# Patient Record
Sex: Male | Born: 1935 | Race: White | Hispanic: No | Marital: Married | Smoking: Former smoker
Health system: Southern US, Community
[De-identification: ages and names within clinical notes are randomized; demographics above are authoritative.]

## PROBLEM LIST (undated history)

## (undated) DIAGNOSIS — J302 Other seasonal allergic rhinitis: Secondary | ICD-10-CM

## (undated) DIAGNOSIS — M1990 Unspecified osteoarthritis, unspecified site: Secondary | ICD-10-CM

## (undated) DIAGNOSIS — K5790 Diverticulosis of intestine, part unspecified, without perforation or abscess without bleeding: Secondary | ICD-10-CM

## (undated) DIAGNOSIS — N400 Enlarged prostate without lower urinary tract symptoms: Secondary | ICD-10-CM

## (undated) DIAGNOSIS — I639 Cerebral infarction, unspecified: Secondary | ICD-10-CM

## (undated) DIAGNOSIS — C801 Malignant (primary) neoplasm, unspecified: Secondary | ICD-10-CM

## (undated) DIAGNOSIS — I82409 Acute embolism and thrombosis of unspecified deep veins of unspecified lower extremity: Secondary | ICD-10-CM

## (undated) DIAGNOSIS — Z9289 Personal history of other medical treatment: Secondary | ICD-10-CM

## (undated) DIAGNOSIS — I2699 Other pulmonary embolism without acute cor pulmonale: Secondary | ICD-10-CM

## (undated) HISTORY — PX: TONSILLECTOMY: SUR1361

## (undated) HISTORY — PX: SHOULDER SURGERY: SHX246

---

## 1997-10-16 ENCOUNTER — Emergency Department (HOSPITAL_COMMUNITY): Admission: EM | Admit: 1997-10-16 | Discharge: 1997-10-16 | Payer: Self-pay | Admitting: Emergency Medicine

## 1999-06-27 ENCOUNTER — Encounter: Payer: Self-pay | Admitting: Orthopedic Surgery

## 1999-06-27 ENCOUNTER — Encounter: Admission: RE | Admit: 1999-06-27 | Discharge: 1999-06-27 | Payer: Self-pay | Admitting: Orthopedic Surgery

## 2005-04-20 DIAGNOSIS — I2699 Other pulmonary embolism without acute cor pulmonale: Secondary | ICD-10-CM

## 2005-04-20 DIAGNOSIS — I82409 Acute embolism and thrombosis of unspecified deep veins of unspecified lower extremity: Secondary | ICD-10-CM

## 2005-04-20 HISTORY — DX: Other pulmonary embolism without acute cor pulmonale: I26.99

## 2005-04-20 HISTORY — DX: Acute embolism and thrombosis of unspecified deep veins of unspecified lower extremity: I82.409

## 2005-10-20 ENCOUNTER — Inpatient Hospital Stay (HOSPITAL_COMMUNITY): Admission: AC | Admit: 2005-10-20 | Discharge: 2005-10-23 | Payer: Self-pay

## 2005-12-03 ENCOUNTER — Encounter: Admission: RE | Admit: 2005-12-03 | Discharge: 2005-12-03 | Payer: Self-pay | Admitting: Family Medicine

## 2005-12-04 ENCOUNTER — Encounter: Admission: RE | Admit: 2005-12-04 | Discharge: 2005-12-04 | Payer: Self-pay | Admitting: Family Medicine

## 2005-12-05 ENCOUNTER — Inpatient Hospital Stay (HOSPITAL_COMMUNITY): Admission: EM | Admit: 2005-12-05 | Discharge: 2005-12-09 | Payer: Self-pay | Admitting: Emergency Medicine

## 2006-01-06 ENCOUNTER — Ambulatory Visit (HOSPITAL_COMMUNITY): Admission: RE | Admit: 2006-01-06 | Discharge: 2006-01-06 | Payer: Self-pay | Admitting: Neurosurgery

## 2006-11-24 ENCOUNTER — Encounter: Admission: RE | Admit: 2006-11-24 | Discharge: 2006-11-24 | Payer: Self-pay | Admitting: Family Medicine

## 2008-04-20 HISTORY — PX: OTHER SURGICAL HISTORY: SHX169
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
## 2012-04-27 ENCOUNTER — Other Ambulatory Visit: Payer: Self-pay | Admitting: Physician Assistant

## 2012-04-27 ENCOUNTER — Ambulatory Visit
Admission: RE | Admit: 2012-04-27 | Discharge: 2012-04-27 | Disposition: A | Discharge status: Home / Self Care | Payer: Medicare Other | Source: Ambulatory Visit | Attending: Physician Assistant | Admitting: Physician Assistant

## 2012-04-27 DIAGNOSIS — R52 Pain, unspecified: Secondary | ICD-10-CM

## 2012-04-27 DIAGNOSIS — L539 Erythematous condition, unspecified: Secondary | ICD-10-CM

## 2012-04-27 DIAGNOSIS — R609 Edema, unspecified: Secondary | ICD-10-CM

## 2014-06-07 ENCOUNTER — Other Ambulatory Visit (INDEPENDENT_AMBULATORY_CARE_PROVIDER_SITE_OTHER): Payer: Self-pay | Admitting: Surgery

## 2014-06-22 ENCOUNTER — Encounter (HOSPITAL_COMMUNITY): Payer: Self-pay

## 2014-06-22 ENCOUNTER — Encounter (HOSPITAL_COMMUNITY)
Admission: RE | Admit: 2014-06-22 | Discharge: 2014-06-22 | Disposition: A | Discharge status: Home / Self Care | Payer: Medicare Other | Source: Ambulatory Visit | Attending: Surgery | Admitting: Surgery

## 2014-06-22 DIAGNOSIS — Z8673 Personal history of transient ischemic attack (TIA), and cerebral infarction without residual deficits: Secondary | ICD-10-CM | POA: Diagnosis not present

## 2014-06-22 DIAGNOSIS — Z9089 Acquired absence of other organs: Secondary | ICD-10-CM | POA: Diagnosis not present

## 2014-06-22 DIAGNOSIS — K4090 Unilateral inguinal hernia, without obstruction or gangrene, not specified as recurrent: Secondary | ICD-10-CM | POA: Diagnosis present

## 2014-06-22 DIAGNOSIS — K4020 Bilateral inguinal hernia, without obstruction or gangrene, not specified as recurrent: Secondary | ICD-10-CM | POA: Diagnosis not present

## 2014-06-22 DIAGNOSIS — Z87891 Personal history of nicotine dependence: Secondary | ICD-10-CM | POA: Diagnosis not present

## 2014-06-22 DIAGNOSIS — Z79899 Other long term (current) drug therapy: Secondary | ICD-10-CM | POA: Diagnosis not present

## 2014-06-22 DIAGNOSIS — Z7951 Long term (current) use of inhaled steroids: Secondary | ICD-10-CM | POA: Diagnosis not present

## 2014-06-22 DIAGNOSIS — K429 Umbilical hernia without obstruction or gangrene: Secondary | ICD-10-CM | POA: Diagnosis present

## 2014-06-22 DIAGNOSIS — Z86711 Personal history of pulmonary embolism: Secondary | ICD-10-CM | POA: Diagnosis not present

## 2014-06-22 HISTORY — DX: Cerebral infarction, unspecified: I63.9

## 2014-06-22 HISTORY — DX: Personal history of other medical treatment: Z92.89

## 2014-06-22 HISTORY — DX: Acute embolism and thrombosis of unspecified deep veins of unspecified lower extremity: I82.409

## 2014-06-22 HISTORY — DX: Unspecified osteoarthritis, unspecified site: M19.90

## 2014-06-22 HISTORY — DX: Other seasonal allergic rhinitis: J30.2

## 2014-06-22 HISTORY — DX: Malignant (primary) neoplasm, unspecified: C80.1

## 2014-06-22 HISTORY — DX: Benign prostatic hyperplasia without lower urinary tract symptoms: N40.0

## 2014-06-22 LAB — BASIC METABOLIC PANEL
Anion gap: 5 (ref 5–15)
BUN: 13 mg/dL (ref 6–23)
CALCIUM: 9.1 mg/dL (ref 8.4–10.5)
CO2: 27 mmol/L (ref 19–32)
Chloride: 107 mmol/L (ref 96–112)
Creatinine, Ser: 1.04 mg/dL (ref 0.50–1.35)
GFR calc Af Amer: 77 mL/min — ABNORMAL LOW (ref >90)
GFR, EST NON AFRICAN AMERICAN: 67 mL/min — AB (ref >90)
GLUCOSE: 134 mg/dL — AB (ref 70–99)
Potassium: 3.9 mmol/L (ref 3.5–5.1)
Sodium: 139 mmol/L (ref 135–145)

## 2014-06-22 LAB — CBC
HEMATOCRIT: 44.2 % (ref 39.0–52.0)
HEMOGLOBIN: 14.8 g/dL (ref 13.0–17.0)
MCH: 31.8 pg (ref 26.0–34.0)
MCHC: 33.5 g/dL (ref 30.0–36.0)
MCV: 95.1 fL (ref 78.0–100.0)
Platelets: 154 10*3/uL (ref 150–400)
RBC: 4.65 MIL/uL (ref 4.22–5.81)
RDW: 13.5 % (ref 11.5–15.5)
WBC: 6.4 10*3/uL (ref 4.0–10.5)

## 2014-06-22 NOTE — Pre-Procedure Instructions (Signed)
Robert Everett  06/22/2014   Your procedure is scheduled on:  3/ 01/2015  Report to [facility] Admitting   ENTRANCE  A   at 6:45 AM.  Call this number if you have problems the morning of surgery: [phone]   Remember:   Do not eat food or drink liquids after midnight.  On  Wednesday  Take these medicines the morning of surgery with A SIP OF WATER: NOTHING   Do not wear jewelry   Do not wear lotions, powders, or perfumes. You may wear deodorant.    Men may shave face and neck.   Do not bring valuables to the hospital.  [facility] is not responsible                  for any belongings or valuables.               Contacts, dentures or bridgework may not be worn into surgery.   Leave suitcase in the car. After surgery it may be brought to your room.   For patients admitted to the hospital, discharge time is determined by your                treatment team.               Patients discharged the day of surgery will not be allowed to drive  home.  Name and phone number of your driver: with family  Special Instructions: Special Instructions: [place] - Preparing for Surgery  Before surgery, you can play an important role.  Because skin is not sterile, your skin needs to be as free of germs as possible.  You can reduce the number of germs on you skin by washing with CHG (chlorahexidine gluconate) soap before surgery.  CHG is an antiseptic cleaner which kills germs and bonds with the skin to continue killing germs even after washing.  Please DO NOT use if you have an allergy to CHG or antibacterial soaps.  If your skin becomes reddened/irritated stop using the CHG and inform your nurse when you arrive at Short Stay.  Do not shave (including legs and underarms) for at least 48 hours prior to the first CHG shower.  You may shave your face.  Please follow these instructions carefully:   1.  Shower with CHG Soap the night before surgery and the  morning of Surgery.  2.  If  you choose to wash your hair, wash your hair first as usual with your  normal shampoo.  3.  After you shampoo, rinse your hair and body thoroughly to remove the  Shampoo.  4.  Use CHG as you would any other liquid soap.  You can apply chg directly to the skin and wash gently with scrungie or a clean washcloth.  5.  Apply the CHG Soap to your body ONLY FROM THE NECK DOWN.    Do not use on open wounds or open sores.  Avoid contact with your eyes, ears, mouth and genitals (private parts).  Wash genitals (private parts)   with your normal soap.  6.  Wash thoroughly, paying special attention to the area where your surgery will be performed.  7.  Thoroughly rinse your body with warm water from the neck down.  8.  DO NOT shower/wash with your normal soap after using and rinsing off   the CHG Soap.  9.  Pat yourself dry with a clean towel.  10.  Wear clean pajamas.            11.  Place clean sheets on your bed the night of your first shower and do not sleep with pets.  Day of Surgery  Do not apply any lotions/deodorants the morning of surgery.  Please wear clean clothes to the hospital/surgery center.   Please read over the following fact sheets that you were given: Pain Booklet, Coughing and Deep Breathing and Surgical Site Infection Prevention

## 2014-06-22 NOTE — Progress Notes (Signed)
Pt. Aware of need to hold aspirin after Sat. 06/23/2014

## 2014-06-27 MED ORDER — CEFAZOLIN SODIUM-DEXTROSE 2-3 GM-% IV SOLR
2.0000 g | INTRAVENOUS | Status: AC
  Administered 2014-06-28: 2 g via INTRAVENOUS
  Filled 2014-06-27: qty 50

## 2014-06-27 NOTE — H&P (Signed)
Robert Everett 06/07/2014 1:34 PM Location: [facility] Patient #: [id number] DOB: 1934/04/16 Married / Language: Cleophus Molt / Race: White Male  History of Present Illness Nathaneil Canary A. Ninfa Linden MD; 06/07/2014 1:53 PM) Patient words: RIH.  The patient is a 81 year old male who presents with an umbilical hernia. This is a very pleasant gentleman referred to me by Dr. Donnie Coffin for evaluation of a right inguinal hernia. The patient noticed that after lifting a wheelchair taking care of his daughter. He has noticed a easily reducible bulge in the groin. He has pain but no nausea or vomiting or obstructive symptoms. He is otherwise healthy without complaints.   Other Problems Marjean Donna, CMA; 06/07/2014 1:35 PM) Cerebrovascular Accident Diverticulosis Melanoma Pulmonary Embolism / Blood Clot in Legs  Past Surgical History Marjean Donna, CMA; 06/07/2014 1:35 PM) Colon Polyp Removal - Colonoscopy Shoulder Surgery Right. Tonsillectomy  Diagnostic Studies History Marjean Donna, CMA; 06/07/2014 1:35 PM) Colonoscopy 5-10 years ago  Allergies Davy Pique Bynum, CMA; 06/07/2014 1:35 PM) No Known Drug Allergies02/18/2016  Medication History (Sonya Bynum, CMA; 06/07/2014 1:35 PM) Fluticasone Propionate (50MCG/ACT Suspension, Nasal) Active. Atorvastatin Calcium (40MG  Tablet, Oral) Active.  Social History Marjean Donna, CMA; 06/07/2014 1:35 PM) Caffeine use Carbonated beverages, Tea. No alcohol use No drug use Tobacco use Former smoker.  Family History Marjean Donna, Kake; 06/07/2014 1:35 PM) Alcohol Abuse Son. Cancer Father. Heart disease in male family member before age 69  Review of Systems (Plain; 06/07/2014 1:35 PM) General Not Present- Appetite Loss, Chills, Fatigue, Fever, Night Sweats, Weight Gain and Weight Loss. Skin Not Present- Change in Wart/Mole, Dryness, Hives, Jaundice, New Lesions, Non-Healing Wounds, Rash and Ulcer. HEENT Present- Hearing  Loss, Ringing in the Ears, Seasonal Allergies and Wears glasses/contact lenses. Not Present- Earache, Hoarseness, Nose Bleed, Oral Ulcers, Sinus Pain, Sore Throat, Visual Disturbances and Yellow Eyes. Breast Not Present- Breast Mass, Breast Pain, Nipple Discharge and Skin Changes. Cardiovascular Not Present- Chest Pain, Difficulty Breathing Lying Down, Leg Cramps, Palpitations, Rapid Heart Rate, Shortness of Breath and Swelling of Extremities. Gastrointestinal Not Present- Abdominal Pain, Bloating, Bloody Stool, Change in Bowel Habits, Chronic diarrhea, Constipation, Difficulty Swallowing, Excessive gas, Gets full quickly at meals, Hemorrhoids, Indigestion, Nausea, Rectal Pain and Vomiting. Male Genitourinary Not Present- Blood in Urine, Change in Urinary Stream, Frequency, Impotence, Nocturia, Painful Urination, Urgency and Urine Leakage. Musculoskeletal Present- Joint Stiffness. Not Present- Back Pain, Joint Pain, Muscle Pain, Muscle Weakness and Swelling of Extremities. Neurological Not Present- Decreased Memory, Fainting, Headaches, Numbness, Seizures, Tingling, Tremor, Trouble walking and Weakness. Psychiatric Not Present- Anxiety, Bipolar, Change in Sleep Pattern, Depression, Fearful and Frequent crying. Endocrine Not Present- Cold Intolerance, Excessive Hunger, Hair Changes, Heat Intolerance, Hot flashes and New Diabetes. Hematology Not Present- Easy Bruising, Excessive bleeding, Gland problems, HIV and Persistent Infections.   Vitals (Sonya Bynum CMA; 06/07/2014 1:36 PM) 06/07/2014 1:35 PM Weight: 184 lb Height: 67in Body Surface Area: 1.99 m Body Mass Index: 28.82 kg/m Temp.: 98.51F(Temporal)  Pulse: 73 (Regular)  BP: 130/74 (Sitting, Left Arm, Standard)    Physical Exam (Jennefer Kopp A. Ninfa Linden MD; 06/07/2014 1:54 PM) General Mental Status-Alert. General Appearance-Consistent with stated age. Hydration-Well hydrated. Voice-Normal.  Head and  Neck Head-normocephalic, atraumatic with no lesions or palpable masses. Trachea-midline.  Eye Eyeball - Bilateral-Extraocular movements intact. Sclera/Conjunctiva - Bilateral-No scleral icterus.  Chest and Lung Exam Chest and lung exam reveals -quiet, even and easy respiratory effort with no use of accessory muscles and on auscultation, normal breath sounds, no adventitious  sounds and normal vocal resonance. Inspection Chest Wall - Normal. Back - normal.  Cardiovascular Cardiovascular examination reveals -normal heart sounds, regular rate and rhythm with no murmurs and normal pedal pulses bilaterally.  Abdomen Inspection Skin - Scar - no surgical scars. Hernias - Umbilical hernia - Reducible. Inguinal hernia - Right - Reducible. Palpation/Percussion Palpation and Percussion of the abdomen reveal - Soft, Non Tender, No Rebound tenderness, No Rigidity (guarding) and No hepatosplenomegaly. Auscultation Auscultation of the abdomen reveals - Bowel sounds normal.  Neurologic Neurologic evaluation reveals -alert and oriented x 3 with no impairment of recent or remote memory. Mental Status-Normal.  Musculoskeletal Normal Exam - Left-Upper Extremity Strength Normal and Lower Extremity Strength Normal. Normal Exam - Right-Upper Extremity Strength Normal, Lower Extremity Weakness.    Assessment & Plan (Patsy Zaragoza A. Ninfa Linden MD; 06/07/2014 1:55 PM) RIGHT INGUINAL HERNIA (550.90  K40.90) Impression: He does have a moderate-sized right inguinal hernia. The left inguinal area does feel weakened. He also has a very small umbilical hernia. I discussed repair of the hernias with mesh. I discussed both the open and laboratory techniques. I recommended left scopic right inguinal hernia repair with mesh. At that time I can fix the umbilical hernia as well as evaluate the left inguinal floor. I would fix that hernia with mesh if there was one present. I discussed the risks of surgery  which includes but is not limited to bleeding, infection, chronic pain, recurrence, conversion to an open procedure, etc. I Also discussed postoperative recovery. He understands and wishes to proceed     Signed by Harl Bowie, MD (06/07/2014 1:56 PM)

## 2014-06-28 ENCOUNTER — Ambulatory Visit (HOSPITAL_COMMUNITY): Payer: Medicare Other | Admitting: Anesthesiology

## 2014-06-28 ENCOUNTER — Encounter (HOSPITAL_COMMUNITY): Payer: Self-pay | Admitting: *Deleted

## 2014-06-28 ENCOUNTER — Encounter (HOSPITAL_COMMUNITY)
Admission: RE | Disposition: A | Discharge status: Home / Self Care | Payer: Self-pay | Source: Ambulatory Visit | Attending: Surgery

## 2014-06-28 ENCOUNTER — Ambulatory Visit (HOSPITAL_COMMUNITY)
Admission: RE | Admit: 2014-06-28 | Discharge: 2014-06-28 | Disposition: A | Discharge status: Home / Self Care | Payer: Medicare Other | Source: Ambulatory Visit | Attending: Surgery | Admitting: Surgery

## 2014-06-28 DIAGNOSIS — K4020 Bilateral inguinal hernia, without obstruction or gangrene, not specified as recurrent: Secondary | ICD-10-CM | POA: Insufficient documentation

## 2014-06-28 DIAGNOSIS — Z9089 Acquired absence of other organs: Secondary | ICD-10-CM | POA: Diagnosis not present

## 2014-06-28 DIAGNOSIS — Z7951 Long term (current) use of inhaled steroids: Secondary | ICD-10-CM | POA: Insufficient documentation

## 2014-06-28 DIAGNOSIS — K429 Umbilical hernia without obstruction or gangrene: Secondary | ICD-10-CM | POA: Insufficient documentation

## 2014-06-28 DIAGNOSIS — Z8673 Personal history of transient ischemic attack (TIA), and cerebral infarction without residual deficits: Secondary | ICD-10-CM | POA: Insufficient documentation

## 2014-06-28 DIAGNOSIS — Z86711 Personal history of pulmonary embolism: Secondary | ICD-10-CM | POA: Insufficient documentation

## 2014-06-28 DIAGNOSIS — Z87891 Personal history of nicotine dependence: Secondary | ICD-10-CM | POA: Insufficient documentation

## 2014-06-28 DIAGNOSIS — Z79899 Other long term (current) drug therapy: Secondary | ICD-10-CM | POA: Insufficient documentation

## 2014-06-28 HISTORY — PX: LAPAROSCOPIC INGUINAL HERNIA WITH UMBILICAL HERNIA: SHX5658

## 2014-06-28 HISTORY — PX: INSERTION OF MESH: SHX5868

## 2014-06-28 SURGERY — LAPAROSCOPIC INGUINAL HERNIA WITH UMBILICAL HERNIA
Anesthesia: General | Laterality: Bilateral

## 2014-06-28 MED ORDER — KETOROLAC TROMETHAMINE 30 MG/ML IJ SOLN
30.0000 mg | Freq: Once | INTRAMUSCULAR | Status: AC | PRN
  Administered 2014-06-28: 30 mg via INTRAVENOUS

## 2014-06-28 MED ORDER — LIDOCAINE HCL (CARDIAC) 20 MG/ML IV SOLN
INTRAVENOUS | Status: AC
  Filled 2014-06-28: qty 5

## 2014-06-28 MED ORDER — ROCURONIUM BROMIDE 100 MG/10ML IV SOLN
INTRAVENOUS | Status: DC | PRN
  Administered 2014-06-28: 40 mg via INTRAVENOUS
  Administered 2014-06-28: 10 mg via INTRAVENOUS

## 2014-06-28 MED ORDER — MIDAZOLAM HCL 5 MG/5ML IJ SOLN
INTRAMUSCULAR | Status: DC | PRN
Start: 2014-06-28 — End: 2014-06-28
  Administered 2014-06-28: 1 mg via INTRAVENOUS

## 2014-06-28 MED ORDER — KETOROLAC TROMETHAMINE 30 MG/ML IJ SOLN
INTRAMUSCULAR | Status: AC
  Filled 2014-06-28: qty 1

## 2014-06-28 MED ORDER — ONDANSETRON HCL 4 MG/2ML IJ SOLN
INTRAMUSCULAR | Status: AC
  Filled 2014-06-28: qty 2

## 2014-06-28 MED ORDER — FENTANYL CITRATE 0.05 MG/ML IJ SOLN
25.0000 ug | INTRAMUSCULAR | Status: DC | PRN
  Administered 2014-06-28 (×4): 25 ug via INTRAVENOUS

## 2014-06-28 MED ORDER — 0.9 % SODIUM CHLORIDE (POUR BTL) OPTIME
TOPICAL | Status: DC | PRN
  Administered 2014-06-28: 1000 mL

## 2014-06-28 MED ORDER — GLYCOPYRROLATE 0.2 MG/ML IJ SOLN
INTRAMUSCULAR | Status: DC | PRN
  Administered 2014-06-28: 0.6 mg via INTRAVENOUS

## 2014-06-28 MED ORDER — ROCURONIUM BROMIDE 50 MG/5ML IV SOLN
INTRAVENOUS | Status: AC
  Filled 2014-06-28: qty 1

## 2014-06-28 MED ORDER — MIDAZOLAM HCL 2 MG/2ML IJ SOLN
INTRAMUSCULAR | Status: AC
  Filled 2014-06-28: qty 2

## 2014-06-28 MED ORDER — BUPIVACAINE-EPINEPHRINE (PF) 0.25% -1:200000 IJ SOLN
INTRAMUSCULAR | Status: AC
  Filled 2014-06-28: qty 30

## 2014-06-28 MED ORDER — GLYCOPYRROLATE 0.2 MG/ML IJ SOLN
INTRAMUSCULAR | Status: AC
  Filled 2014-06-28: qty 3

## 2014-06-28 MED ORDER — LIDOCAINE HCL (CARDIAC) 20 MG/ML IV SOLN
INTRAVENOUS | Status: DC | PRN
  Administered 2014-06-28: 80 mg via INTRAVENOUS

## 2014-06-28 MED ORDER — NEOSTIGMINE METHYLSULFATE 10 MG/10ML IV SOLN
INTRAVENOUS | Status: DC | PRN
  Administered 2014-06-28: 4 mg via INTRAVENOUS

## 2014-06-28 MED ORDER — PROPOFOL 10 MG/ML IV BOLUS
INTRAVENOUS | Status: AC
  Filled 2014-06-28: qty 20

## 2014-06-28 MED ORDER — FENTANYL CITRATE 0.05 MG/ML IJ SOLN
INTRAMUSCULAR | Status: AC
  Filled 2014-06-28: qty 2

## 2014-06-28 MED ORDER — ONDANSETRON HCL 4 MG/2ML IJ SOLN: 4.0000 mg | Freq: Once | INTRAMUSCULAR | Status: DC | PRN

## 2014-06-28 MED ORDER — ONDANSETRON HCL 4 MG/2ML IJ SOLN
INTRAMUSCULAR | Status: DC | PRN
  Administered 2014-06-28: 4 mg via INTRAVENOUS

## 2014-06-28 MED ORDER — HYDROCODONE-ACETAMINOPHEN 5-325 MG PO TABS: 1.0000 | ORAL_TABLET | ORAL | Status: DC | PRN

## 2014-06-28 MED ORDER — FENTANYL CITRATE 0.05 MG/ML IJ SOLN
INTRAMUSCULAR | Status: DC | PRN
  Administered 2014-06-28: 50 ug via INTRAVENOUS
  Administered 2014-06-28: 100 ug via INTRAVENOUS

## 2014-06-28 MED ORDER — PROPOFOL 10 MG/ML IV BOLUS
INTRAVENOUS | Status: DC | PRN
  Administered 2014-06-28: 160 mg via INTRAVENOUS

## 2014-06-28 MED ORDER — BUPIVACAINE HCL (PF) 0.25 % IJ SOLN
INTRAMUSCULAR | Status: DC | PRN
  Administered 2014-06-28: 20 mL

## 2014-06-28 MED ORDER — FENTANYL CITRATE 0.05 MG/ML IJ SOLN
INTRAMUSCULAR | Status: AC
  Filled 2014-06-28: qty 5

## 2014-06-28 MED ORDER — LACTATED RINGERS IV SOLN
INTRAVENOUS | Status: DC
  Administered 2014-06-28 (×2): via INTRAVENOUS

## 2014-06-28 SURGICAL SUPPLY — 35 items
APPLIER CLIP LOGIC TI 5 (MISCELLANEOUS) IMPLANT
APR CLP MED LRG 33X5 (MISCELLANEOUS)
BLADE SURG ROTATE 9660 (MISCELLANEOUS) IMPLANT
CANISTER SUCTION 2500CC (MISCELLANEOUS) IMPLANT
CHLORAPREP W/TINT 26ML (MISCELLANEOUS) ×3 IMPLANT
COVER SURGICAL LIGHT HANDLE (MISCELLANEOUS) ×3 IMPLANT
DEVICE SECURE STRAP 25 ABSORB (INSTRUMENTS) ×3 IMPLANT
DISSECT BALLN SPACEMKR + OVL (BALLOONS) ×3
DISSECTOR BALLN SPACEMKR + OVL (BALLOONS) ×1 IMPLANT
DISSECTOR BLUNT TIP ENDO 5MM (MISCELLANEOUS) IMPLANT
DRAPE LAPAROSCOPIC ABDOMINAL (DRAPES) ×3 IMPLANT
ELECT REM PT RETURN 9FT ADLT (ELECTROSURGICAL) ×3
ELECTRODE REM PT RTRN 9FT ADLT (ELECTROSURGICAL) ×1 IMPLANT
GLOVE SURG SIGNA 7.5 PF LTX (GLOVE) ×3 IMPLANT
GOWN STRL REUS W/ TWL LRG LVL3 (GOWN DISPOSABLE) ×2 IMPLANT
GOWN STRL REUS W/ TWL XL LVL3 (GOWN DISPOSABLE) ×1 IMPLANT
GOWN STRL REUS W/TWL LRG LVL3 (GOWN DISPOSABLE) ×6
GOWN STRL REUS W/TWL XL LVL3 (GOWN DISPOSABLE) ×3
KIT BASIN OR (CUSTOM PROCEDURE TRAY) ×3 IMPLANT
KIT ROOM TURNOVER OR (KITS) ×3 IMPLANT
LIQUID BAND (GAUZE/BANDAGES/DRESSINGS) ×3 IMPLANT
MESH 3DMAX 4X6 LT LRG (Mesh General) ×3 IMPLANT
MESH 3DMAX 4X6 RT LRG (Mesh General) ×3 IMPLANT
NDL INSUFFLATION 14GA 120MM (NEEDLE) IMPLANT
NEEDLE INSUFFLATION 14GA 120MM (NEEDLE) IMPLANT
NS IRRIG 1000ML POUR BTL (IV SOLUTION) ×3 IMPLANT
PAD ARMBOARD 7.5X6 YLW CONV (MISCELLANEOUS) ×3 IMPLANT
SET IRRIG TUBING LAPAROSCOPIC (IRRIGATION / IRRIGATOR) IMPLANT
SET TROCAR LAP APPLE-HUNT 5MM (ENDOMECHANICALS) ×3 IMPLANT
SUT MNCRL AB 4-0 PS2 18 (SUTURE) ×3 IMPLANT
TOWEL OR 17X24 6PK STRL BLUE (TOWEL DISPOSABLE) ×3 IMPLANT
TOWEL OR 17X26 10 PK STRL BLUE (TOWEL DISPOSABLE) ×3 IMPLANT
TRAY FOLEY CATH 16FR SILVER (SET/KITS/TRAYS/PACK) ×3 IMPLANT
TRAY LAPAROSCOPIC (CUSTOM PROCEDURE TRAY) ×3 IMPLANT
TUBING INSUFFLATION (TUBING) ×3 IMPLANT

## 2014-06-28 NOTE — Anesthesia Procedure Notes (Signed)
Procedure Name: Intubation Date/Time: 06/28/2014 8:56 AM Performed by: Kyung Rudd Pre-anesthesia Checklist: Patient identified, Emergency Drugs available, Suction available, Patient being monitored and Timeout performed Patient Re-evaluated:Patient Re-evaluated prior to inductionOxygen Delivery Method: Circle system utilized Preoxygenation: Pre-oxygenation with 100% oxygen Intubation Type: IV induction Ventilation: Mask ventilation without difficulty and Oral airway inserted - appropriate to patient size Laryngoscope Size: Mac and 3 Grade View: Grade I Tube type: Oral Tube size: 7.5 mm Number of attempts: 1 Airway Equipment and Method: Stylet Placement Confirmation: ETT inserted through vocal cords under direct vision,  positive ETCO2 and breath sounds checked- equal and bilateral Secured at: 21 cm Tube secured with: Tape Dental Injury: Teeth and Oropharynx as per pre-operative assessment

## 2014-06-28 NOTE — Op Note (Signed)
NAME:  Robert Everett, NEIS NO.:  [id number]  MEDICAL RECORD NO.:  [id number]  LOCATION:  [facility]                         FACILITY:  [facility]  PHYSICIAN:  Coralie Keens, M.D. DATE OF BIRTH:  1933/12/15  DATE OF PROCEDURE:  06/28/2014 DATE OF DISCHARGE:  06/28/2014                              OPERATIVE REPORT   POSTOPERATIVE DIAGNOSES:  Right inguinal hernia and umbilical hernia.  POSTOPERATIVE DIAGNOSES:  Bilateral inguinal hernias and umbilical hernia.  PROCEDURES: 1. Bilateral laparoscopic inguinal hernia repair with mesh. 2. Umbilical hernia repair.  SURGEON:  Coralie Keens, M.D.  ANESTHESIA:  General and 0.5% Marcaine.  ESTIMATED BLOOD LOSS:  Minimal.  FINDINGS:  The patient was found to have bilateral direct and indirect inguinal hernias with the right being larger than the left.  There was also a very tiny umbilical hernia.  I did not repair the umbilical hernia with mesh, I just repaired it primarily.  The inguinal hernias were repaired with Bard 3DMax Prolene mesh.  A large piece was used for each side.  DESCRIPTION OF PROCEDURE:  The patient was brought to the operating room, identified as Daine Gip.  He was placed supine on the operating room table.  General anesthesia was induced.  A Foley catheter was then inserted.  His abdomen was then prepped and draped in usual sterile fashion.  I made a small vertical incision at the lower part of the umbilicus.  I carried this down to the fascia which was opened just to the right of midline.  The rectus muscle was then identified and elevated.  I placed a dissecting balloon underneath the rectus muscle and manipulated towards the pelvis.  The dissecting balloon was then insufflated under direct space, dissecting out the preperitoneal space. The dissecting balloon was then removed and the insufflation was begun. Two 5 mm ports were then placed in the patient's lower midline, both under direct vision.   I dissected out the right inguinal area first. The patient had a moderate-sized direct hernia as well as a moderate indirect hernia.  I was able to easily reduce the indirect hernial sac from the cord structures.  I then turned my attention to the left inguinal area.  Again, the patient had a much smaller direct as well as a much smaller indirect inguinal hernia on this side.  I was able again to easily reduce the sac on the left side from the cord structures.  At this point, a left-sided large 3DMax Bard Prolene mesh was brought onto the field.  I placed it through the umbilical port and opened it as an onlay on the left inguinal floor.  I then used the Securestrap absorbable tacker to tack it to [place] ligament up the medial abdominal wall slightly laterally.  Good coverage of the fascial defects and testicular cord appeared to be achieved.  I then brought a right-sided piece of Bard 3DMax Prolene mesh onto the field.  I placed it through the umbilical port and opened it as an onlay on the right inguinal floor.  Again, I tacked it to [place] ligament up the medial abdominal wall slightly laterally.  Again, good coverage of the fascial defects and cord structures  appeared to be achieved.  At this point, hemostasis also appeared to be achieved.  I removed the midline ports and the preperitoneal space was seen to collapse appropriately with the mesh in place.  I then removed all ports.  I closed the small fascial defect at the umbilicus with figure-of-eight 0 Vicryl suture as well as the fascial defect from the incision.  I then anesthetized all the wounds with Marcaine.  I performed bilateral ilioinguinal nerve blocks with Marcaine.  I then closed all incisions with 4-0 Monocryl sutures.  Skin glue was then applied.  The patient tolerated the procedure well.  All the counts were correct at the end of the procedure.  The patient was then extubated in the operating room and taken in stable  condition to recovery room.     Coralie Keens, M.D.     DB/MEDQ  D:  06/28/2014  T:  06/28/2014  Job:  [id number]

## 2014-06-28 NOTE — Transfer of Care (Signed)
Immediate Anesthesia Transfer of Care Note  Patient: Robert Everett  Procedure(s) Performed: Procedure(s): LAPAROSCOPIC RIGHT AND LEFT  INGUINAL HERNIA WITH UMBILICAL HERNIA  AND MESH (Bilateral) INSERTION OF MESH (Bilateral)  Patient Location: PACU  Anesthesia Type:General  Level of Consciousness: awake, alert  and oriented  Airway & Oxygen Therapy: Patient Spontanous Breathing and Patient connected to nasal cannula oxygen  Post-op Assessment: Report given to RN and Post -op Vital signs reviewed and stable  Post vital signs: Reviewed and stable  Last Vitals:  Filed Vitals:   06/28/14 0639  BP: 165/59  Pulse: 51  Temp: 36.4 C  Resp: 18    Complications: No apparent anesthesia complications

## 2014-06-28 NOTE — Discharge Instructions (Signed)
[facility] _______[facility]  UMBILICAL OR INGUINAL HERNIA REPAIR: POST OP INSTRUCTIONS  Always review your discharge instruction sheet given to you by the facility where your surgery was performed. IF YOU HAVE DISABILITY OR FAMILY LEAVE FORMS, YOU MUST BRING THEM TO THE OFFICE FOR PROCESSING.   DO NOT GIVE THEM TO YOUR DOCTOR.  1. A  prescription for pain medication may be given to you upon discharge.  Take your pain medication as prescribed, if needed.  If narcotic pain medicine is not needed, then you may take acetaminophen (Tylenol) or ibuprofen (Advil) as needed. 2. Take your usually prescribed medications unless otherwise directed. 3. If you need a refill on your pain medication, please contact your pharmacy.  They will contact our office to request authorization. Prescriptions will not be filled after 5 pm or on week-ends. 4. You should follow a light diet the first 24 hours after arrival home, such as soup and crackers, etc.  Be sure to include lots of fluids daily.  Resume your normal diet the day after surgery. 5. Most patients will experience some swelling and bruising around the umbilicus or in the groin and scrotum.  Ice packs and reclining will help.  Swelling and bruising can take several days to resolve.  6. It is common to experience some constipation if taking pain medication after surgery.  Increasing fluid intake and taking a stool softener (such as Colace) will usually help or prevent this problem from occurring.  A mild laxative (Milk of Magnesia or Miralax) should be taken according to package directions if there are no bowel movements after 48 hours. 7. Unless discharge instructions indicate otherwise, you may remove your bandages 24-48 hours after surgery, and you may shower at that time.  You may have steri-strips (small skin tapes) in place directly over the incision.  These strips should be left on the skin for 7-10 days.  If your surgeon used skin glue on the  incision, you may shower in 24 hours.  The glue will flake off over the next 2-3 weeks.  Any sutures or staples will be removed at the office during your follow-up visit. 8. ACTIVITIES:  You may resume regular (light) daily activities beginning the next day--such as daily self-care, walking, climbing stairs--gradually increasing activities as tolerated.  You may have sexual intercourse when it is comfortable.  Refrain from any heavy lifting or straining until approved by your doctor. a. You may drive when you are no longer taking prescription pain medication, you can comfortably wear a seatbelt, and you can safely maneuver your car and apply brakes. b. RETURN TO WORK:  __________________________________________________________ 9. You should see your doctor in the office for a follow-up appointment approximately 2-3 weeks after your surgery.  Make sure that you call for this appointment within a day or two after you arrive home to insure a convenient appointment time. 10. OTHER INSTRUCTIONS: NO LIFTING MORE THAN 15 TO 20 POUNDS FOR 3 WEEKS 11. ICE PACK ALSO FOR PAIN __________________________________________________________________________________________________________________________________________________________________________________________  WHEN TO CALL YOUR DOCTOR: 1. Fever over 101.0 2. Inability to urinate 3. Nausea and/or vomiting 4. Extreme swelling or bruising 5. Continued bleeding from incision. 6. Increased pain, redness, or drainage from the incision  The clinic staff is available to answer your questions during regular business hours.  Please dont hesitate to call and ask to speak to one of the nurses for clinical concerns.  If you have a medical emergency, go to the nearest emergency room or call 911.  A surgeon from [facility] is always on call at the hospital   [address], [place], [place], [place]  [address] ?  [address], [place], [place] [phone] ? [phone] ? FAX [phone] Web site: [email or web address]  General Anesthesia, Adult, Care After  Refer to this sheet in the next few weeks. These instructions provide you with information on caring for yourself after your procedure. Your health care provider may also give you more specific instructions. Your treatment has been planned according to current medical practices, but problems sometimes occur. Call your health care provider if you have any problems or questions after your procedure.  WHAT TO EXPECT AFTER THE PROCEDURE  After the procedure, it is typical to experience:  Sleepiness.  Nausea and vomiting. HOME CARE INSTRUCTIONS  For the first 24 hours after general anesthesia:  Have a responsible person with you.  Do not drive a car. If you are alone, do not take public transportation.  Do not drink alcohol.  Do not take medicine that has not been prescribed by your health care provider.  Do not sign important papers or make important decisions.  You may resume a normal diet and activities as directed by your health care provider.  Change bandages (dressings) as directed.  If you have questions or problems that seem related to general anesthesia, call the hospital and ask for the anesthetist or anesthesiologist on call. SEEK MEDICAL CARE IF:  You have nausea and vomiting that continue the day after anesthesia.  You develop a rash. SEEK IMMEDIATE MEDICAL CARE IF:  You have difficulty breathing.  You have chest pain.  You have any allergic problems. Document Released: 07/13/2000 Document Revised: 12/07/2012 Document Reviewed: 10/20/2012  [facility] Patient Information 2014 [place], [place].

## 2014-06-28 NOTE — Interval H&P Note (Signed)
History and Physical Interval Note: no change in H and P  06/28/2014 6:44 AM  Robert Everett  has presented today for surgery, with the diagnosis of Right Inguinal Hernia and Umbilical Hernia  The various methods of treatment have been discussed with the patient and family. After consideration of risks, benefits and other options for treatment, the patient has consented to  Procedure(s): LAPAROSCOPIC RIGHT AND POSSBILE LEFT  INGUINAL HERNIA WITH UMBILICAL HERNIA AND MESH (Right) INSERTION OF MESH (Right) as a surgical intervention .  The patient's history has been reviewed, patient examined, no change in status, stable for surgery.  I have reviewed the patient's chart and labs.  Questions were answered to the patient's satisfaction.     Anastacio Bua A

## 2014-06-28 NOTE — Op Note (Signed)
LAPAROSCOPIC RIGHT AND LEFT  INGUINAL HERNIA WITH UMBILICAL HERNIA  AND MESH, INSERTION OF MESH  Procedure Note  Robert Everett 06/28/2014   Pre-op Diagnosis: Right Inguinal Hernia and Umbilical Hernia     Post-op Diagnosis: bilateral inguinal hernia and umbilical hernia  Procedure(s): LAPAROSCOPIC BILATERAL INGUINAL HERNIA REPAIR WITH MESH UMBILICAL HERNIA REPAIR   Surgeon(s): Coralie Keens, MD  Anesthesia: General  Staff:  Circulator: Montel Culver, RN Scrub Person: Beverlee Nims, RN; Liberty Handy, CST Circulator Assistant: Beverlee Nims, RN  Estimated Blood Loss: Minimal                         [name]   Date: 06/28/2014  Time: 9:41 AM

## 2014-06-28 NOTE — Anesthesia Preprocedure Evaluation (Signed)
Anesthesia Evaluation  Patient identified by MRN, date of birth, ID band Patient awake    Reviewed: Allergy & Precautions, NPO status , Patient's Chart, lab work & pertinent test results  Airway Mallampati: II  TM Distance: >3 FB Neck ROM: Full    Dental  (+) Edentulous Upper, Edentulous Lower   Pulmonary former smoker,  breath sounds clear to auscultation        Cardiovascular Rhythm:Regular Rate:Normal     Neuro/Psych    GI/Hepatic   Endo/Other    Renal/GU      Musculoskeletal   Abdominal   Peds  Hematology   Anesthesia Other Findings   Reproductive/Obstetrics                             Anesthesia Physical Anesthesia Plan  ASA: II  Anesthesia Plan: General   Post-op Pain Management:    Induction: Intravenous  Airway Management Planned: Oral ETT  Additional Equipment:   Intra-op Plan:   Post-operative Plan:   Informed Consent: I have reviewed the patients History and Physical, chart, labs and discussed the procedure including the risks, benefits and alternatives for the proposed anesthesia with the patient or authorized representative who has indicated his/her understanding and acceptance.     Plan Discussed with: CRNA and Anesthesiologist  Anesthesia Plan Comments:         Anesthesia Quick Evaluation

## 2014-06-28 NOTE — Anesthesia Postprocedure Evaluation (Signed)
  Anesthesia Post-op Note  Patient: Robert Everett  Procedure(s) Performed: Procedure(s): LAPAROSCOPIC RIGHT AND LEFT  INGUINAL HERNIA WITH UMBILICAL HERNIA  AND MESH (Bilateral) INSERTION OF MESH (Bilateral)  Patient Location: PACU  Anesthesia Type:General  Level of Consciousness: awake, alert  and oriented  Airway and Oxygen Therapy: Patient Spontanous Breathing and Patient connected to nasal cannula oxygen  Post-op Pain: mild  Post-op Assessment: Post-op Vital signs reviewed, Patient's Cardiovascular Status Stable, Respiratory Function Stable, Patent Airway and Pain level controlled  Post-op Vital Signs: stable  Last Vitals:  Filed Vitals:   06/28/14 1046  BP: 135/54  Pulse: 43  Temp:   Resp: 14    Complications: No apparent anesthesia complications

## 2014-07-01 ENCOUNTER — Encounter (HOSPITAL_COMMUNITY): Payer: Self-pay | Admitting: Surgery

## 2014-07-05 ENCOUNTER — Emergency Department (HOSPITAL_COMMUNITY): Payer: Medicare Other

## 2014-07-05 ENCOUNTER — Emergency Department (HOSPITAL_COMMUNITY)
Admission: EM | Admit: 2014-07-05 | Discharge: 2014-07-06 | Disposition: A | Discharge status: Home / Self Care | Payer: Medicare Other | Attending: Emergency Medicine | Admitting: Emergency Medicine

## 2014-07-05 ENCOUNTER — Encounter (HOSPITAL_COMMUNITY): Payer: Self-pay | Admitting: Emergency Medicine

## 2014-07-05 DIAGNOSIS — R2242 Localized swelling, mass and lump, left lower limb: Secondary | ICD-10-CM | POA: Diagnosis present

## 2014-07-05 DIAGNOSIS — Z8719 Personal history of other diseases of the digestive system: Secondary | ICD-10-CM | POA: Insufficient documentation

## 2014-07-05 DIAGNOSIS — Z7951 Long term (current) use of inhaled steroids: Secondary | ICD-10-CM | POA: Insufficient documentation

## 2014-07-05 DIAGNOSIS — Z8709 Personal history of other diseases of the respiratory system: Secondary | ICD-10-CM | POA: Insufficient documentation

## 2014-07-05 DIAGNOSIS — I82402 Acute embolism and thrombosis of unspecified deep veins of left lower extremity: Secondary | ICD-10-CM | POA: Insufficient documentation

## 2014-07-05 DIAGNOSIS — Z8673 Personal history of transient ischemic attack (TIA), and cerebral infarction without residual deficits: Secondary | ICD-10-CM | POA: Diagnosis not present

## 2014-07-05 DIAGNOSIS — M7989 Other specified soft tissue disorders: Secondary | ICD-10-CM | POA: Diagnosis not present

## 2014-07-05 DIAGNOSIS — Z79899 Other long term (current) drug therapy: Secondary | ICD-10-CM | POA: Insufficient documentation

## 2014-07-05 DIAGNOSIS — M1990 Unspecified osteoarthritis, unspecified site: Secondary | ICD-10-CM | POA: Insufficient documentation

## 2014-07-05 DIAGNOSIS — Z7982 Long term (current) use of aspirin: Secondary | ICD-10-CM | POA: Insufficient documentation

## 2014-07-05 DIAGNOSIS — I82409 Acute embolism and thrombosis of unspecified deep veins of unspecified lower extremity: Secondary | ICD-10-CM

## 2014-07-05 DIAGNOSIS — Z85828 Personal history of other malignant neoplasm of skin: Secondary | ICD-10-CM | POA: Diagnosis not present

## 2014-07-05 DIAGNOSIS — Z87891 Personal history of nicotine dependence: Secondary | ICD-10-CM | POA: Diagnosis not present

## 2014-07-05 DIAGNOSIS — Z86711 Personal history of pulmonary embolism: Secondary | ICD-10-CM | POA: Insufficient documentation

## 2014-07-05 HISTORY — DX: Acute embolism and thrombosis of unspecified deep veins of left lower extremity: I82.402

## 2014-07-05 HISTORY — DX: Diverticulosis of intestine, part unspecified, without perforation or abscess without bleeding: K57.90

## 2014-07-05 HISTORY — DX: Other pulmonary embolism without acute cor pulmonale: I26.99

## 2014-07-05 LAB — CBC WITH DIFFERENTIAL/PLATELET
Basophils Absolute: 0 10*3/uL (ref 0.0–0.1)
Basophils Relative: 0 % (ref 0–1)
EOS ABS: 0.3 10*3/uL (ref 0.0–0.7)
EOS PCT: 3 % (ref 0–5)
HCT: 41.8 % (ref 39.0–52.0)
HEMOGLOBIN: 14 g/dL (ref 13.0–17.0)
LYMPHS PCT: 20 % (ref 12–46)
Lymphs Abs: 2 10*3/uL (ref 0.7–4.0)
MCH: 31.3 pg (ref 26.0–34.0)
MCHC: 33.5 g/dL (ref 30.0–36.0)
MCV: 93.3 fL (ref 78.0–100.0)
MONO ABS: 1.1 10*3/uL — AB (ref 0.1–1.0)
MONOS PCT: 11 % (ref 3–12)
NEUTROS ABS: 6.4 10*3/uL (ref 1.7–7.7)
NEUTROS PCT: 66 % (ref 43–77)
PLATELETS: 186 10*3/uL (ref 150–400)
RBC: 4.48 MIL/uL (ref 4.22–5.81)
RDW: 13.2 % (ref 11.5–15.5)
WBC: 9.7 10*3/uL (ref 4.0–10.5)

## 2014-07-05 LAB — I-STAT TROPONIN, ED: Troponin i, poc: 0 ng/mL (ref 0.00–0.08)

## 2014-07-05 LAB — BASIC METABOLIC PANEL
Anion gap: 8 (ref 5–15)
BUN: 19 mg/dL (ref 6–23)
CO2: 25 mmol/L (ref 19–32)
Calcium: 9 mg/dL (ref 8.4–10.5)
Chloride: 105 mmol/L (ref 96–112)
Creatinine, Ser: 1.09 mg/dL (ref 0.50–1.35)
GFR calc Af Amer: 73 mL/min — ABNORMAL LOW (ref >90)
GFR calc non Af Amer: 63 mL/min — ABNORMAL LOW (ref >90)
Glucose, Bld: 111 mg/dL — ABNORMAL HIGH (ref 70–99)
POTASSIUM: 4.2 mmol/L (ref 3.5–5.1)
SODIUM: 138 mmol/L (ref 135–145)

## 2014-07-05 LAB — BRAIN NATRIURETIC PEPTIDE: B Natriuretic Peptide: 91 pg/mL (ref 0.0–100.0)

## 2014-07-05 MED ORDER — HYDROCODONE-ACETAMINOPHEN 5-325 MG PO TABS
1.0000 | ORAL_TABLET | Freq: Once | ORAL | Status: AC
  Administered 2014-07-05: 1 via ORAL
  Filled 2014-07-05: qty 1

## 2014-07-05 MED ORDER — IOHEXOL 350 MG/ML SOLN
100.0000 mL | Freq: Once | INTRAVENOUS | Status: AC | PRN
  Administered 2014-07-05: 100 mL via INTRAVENOUS

## 2014-07-05 MED ORDER — XARELTO VTE STARTER PACK 15 & 20 MG PO TBPK: 15.0000 mg | ORAL_TABLET | ORAL | Status: DC

## 2014-07-05 MED ORDER — RIVAROXABAN (XARELTO) EDUCATION KIT FOR DVT/PE PATIENTS
PACK | Freq: Once | Status: DC
  Filled 2014-07-05: qty 1

## 2014-07-05 NOTE — ED Provider Notes (Signed)
CSN: [id number]     Arrival date & time 07/05/14  1652 History   First MD Initiated Contact with Patient 07/05/14 1922     Chief Complaint  Patient presents with  . Leg Swelling  . Near Syncope    HPI   81 year old male with a history of DVT and PE presents today with left lower extremity swelling. Patient reports that on 06/28/2014 he had a hernia repair without complication. Yesterday while walking he felt pain and swelling of his left distal extremity.. Symptoms resolved with rest but then again reappeared with ambulation. Pt reports that due to his previous history he became anxious. Patient denies headache nausea, vomiting, SOB, chest pain, cough, or pain to the distal extremities. He reports good perfusion of the distal extremities, reportedly are not cold to touch. Patient called his surgeon today who requested he be seen by his primary care provider. PCP advised him to be seen in the emergency room. Patient's past medical history significant for DVT and PE in 2007, caused by chest trauma. In addition to complaints noted above he also notes mild tenderness to his abdomen as result of recent hernia repair. Denies any signs or symptoms of infection including fever redness swelling or discharge at the surgical site.    Past Medical History  Diagnosis Date  . DVT (deep venous thrombosis) 2007    treated with coumadin- x1   . Stroke     TIA- 1990's  . Seasonal allergies     uses Flonase nasal spray  . Prostate enlargement     takes saw palmetto   . Arthritis     hands & shoulder   . Cancer     basal cell- nose  . H/O exercise stress test 2000?    done following what they thought was an TIA, told all was wnll  . Pulmonary embolism 2007  . Diverticulosis    Past Surgical History  Procedure Laterality Date  . Shoulder surgery Right     rotator cuff repair , x2 on the same shoulder  . Polyps removed   2010    on colonoscopy  . Tonsillectomy    . Laparoscopic inguinal hernia with  umbilical hernia Bilateral 06/28/2014    Procedure: LAPAROSCOPIC RIGHT AND LEFT  INGUINAL HERNIA WITH UMBILICAL HERNIA  AND MESH;  Surgeon: Coralie Keens, MD;  Location: [place];  Service: General;  Laterality: Bilateral;  . Insertion of mesh Bilateral 06/28/2014    Procedure: INSERTION OF MESH;  Surgeon: Coralie Keens, MD;  Location: [place];  Service: General;  Laterality: Bilateral;   History reviewed. No pertinent family history. History  Substance Use Topics  . Smoking status: Former Smoker    Quit date: 06/22/1970  . Smokeless tobacco: Never Used  . Alcohol Use: No    Review of Systems  All other systems reviewed and are negative.  Allergies  Review of patient's allergies indicates no known allergies.  Home Medications   Prior to Admission medications   Medication Sig Start Date End Date Taking? Authorizing Provider  acetaminophen (TYLENOL) 500 MG tablet Take 500 mg by mouth every 6 (six) hours as needed for headache (pain).    Historical Provider, MD  aspirin EC 81 MG tablet Take 81 mg by mouth at bedtime.     Historical Provider, MD  atorvastatin (LIPITOR) 40 MG tablet Take 20 mg by mouth at bedtime.  04/26/14   Historical Provider, MD  fluticasone (FLONASE) 50 MCG/ACT nasal spray Place 2 sprays into both  nostrils 4 (four) times daily as needed for allergies or rhinitis.    Historical Provider, MD  HYDROcodone-acetaminophen (NORCO) 5-325 MG per tablet Take 1-2 tablets by mouth every 4 (four) hours as needed. 06/28/14   Coralie Keens, MD  Multiple Vitamins-Minerals (HAIR/SKIN/NAILS/BIOTIN) TABS Take 1 tablet by mouth daily.    Historical Provider, MD  Omega-3 Fatty Acids (OMEGA-3 FISH OIL) 1200 MG CAPS Take 1,200 mg by mouth daily.     Historical Provider, MD  OVER THE COUNTER MEDICATION Apply 1 application topically 2 (two) times daily as needed (pain). Dragon cream    Historical Provider, MD  Saw Palmetto 450 MG CAPS Take 450 mg by mouth 2 (two) times daily.    Historical  Provider, MD   BP 148/67 mmHg  Pulse 61  Temp(Src) 97.8 F (36.6 C) (Oral)  Resp 20  SpO2 97% Physical Exam  Constitutional: He is oriented to person, place, and time. He appears well-developed and well-nourished.  HENT:  Head: Normocephalic and atraumatic.  Eyes: Pupils are equal, round, and reactive to light.  Neck: Normal range of motion. Neck supple. No JVD present. No tracheal deviation present.  Cardiovascular: Normal rate, regular rhythm, normal heart sounds and intact distal pulses.  Exam reveals no gallop and no friction rub.   No murmur heard. Pulmonary/Chest: Effort normal and breath sounds normal. No stridor. No respiratory distress. He has no wheezes. He has no rales. He exhibits no tenderness.  Abdominal: Soft. Bowel sounds are normal. He exhibits no distension and no mass. There is tenderness. There is no rebound and no guarding.  Surgical incisions mildly tender no erythema, warmth, swelling, discharge  Musculoskeletal: Normal range of motion.  Mild swelling to left lower extremity, no skin changes, erythema, discoloration,   Lymphadenopathy:    He has no cervical adenopathy.  Neurological: He is alert and oriented to person, place, and time. Coordination normal.  Skin: Skin is warm and dry.  Psychiatric: He has a normal mood and affect. His behavior is normal. Judgment and thought content normal.  Nursing note and vitals reviewed.   ED Course  Procedures (including critical care time) Labs Review Labs Reviewed  BASIC METABOLIC PANEL - Abnormal; Notable for the following:    Glucose, Bld 111 (*)    GFR calc non Af Amer 63 (*)    GFR calc Af Amer 73 (*)    All other components within normal limits  CBC WITH DIFFERENTIAL/PLATELET - Abnormal; Notable for the following:    Monocytes Absolute 1.1 (*)    All other components within normal limits  BRAIN NATRIURETIC PEPTIDE  I-STAT TROPOININ, ED    Imaging Review Dg Chest 2 View  07/05/2014   CLINICAL DATA:   Shortness of breath.  EXAM: CHEST  2 VIEW  COMPARISON:  November 24, 2006.  FINDINGS: The heart size and mediastinal contours are within normal limits. Both lungs are clear. No pneumothorax or pleural effusion is noted. Stable compression deformity of mid thoracic vertebral body is noted consistent with old fracture.  IMPRESSION: No active cardiopulmonary disease.   Electronically Signed   By: Marijo Conception, M.D.   On: 07/05/2014 17:20     EKG Interpretation   Date/Time:  Thursday July 05 2014 17:05:05 EDT Ventricular Rate:  59 PR Interval:  190 QRS Duration: 84 QT Interval:  422 QTC Calculation: 417 R Axis:   6 Text Interpretation:  Sinus bradycardia Otherwise normal ECG No old  tracing to compare Confirmed by [facility]  MD, Nunzio Cory (  [id number]) on  07/05/2014 9:24:11 PM     MDM   Final diagnoses:  DVT (deep venous thrombosis)    Labs:i stat troponin, BMP,  BNP, CBC- no significant findings  Imaging: CT Angio Chest- no evidence of pulmonary embolus, ultrasound showed DVT tibioperoneal trunk  Consults: Surgery  Therapeutics: Xarelto 15 mg, Norco  Assessment: DVT of the tibial peritoneal trunk  Plan: Patient received several to 15 mg along with pharmacy education for continued maintenance. Patient was discharged home with instructions to follow-up with his primary care provider tomorrow patient was given clear instructions to return to the emergency room if he experienced chest pain, shortness of breath, cough, fever, increased lower extremity pain, swelling, loss of distal sensation of extremity, or any other concerning signs or symptoms. Patient and his wife both understood and agreed to the plan and assured there follow-up with primary care tomorrow.  Okey Regal, PA-C 07/06/14 [place], DO 07/08/14 [phone]

## 2014-07-05 NOTE — ED Notes (Signed)
Pt c/o left leg swelling; pt sts hx of DVT with near syncope yesterday; pt had hernia repair 1 week ago

## 2014-07-05 NOTE — Progress Notes (Signed)
VASCULAR LAB PRELIMINARY  PRELIMINARY  PRELIMINARY  PRELIMINARY  Bilateral lower extremity venous duplex  completed.    Preliminary report:  Right:  No evidence of DVT, superficial thrombosis, or Baker's cyst.  Left: DVT noted on the tibial peroneal trunk.  No evidence of superficial thrombosis.  No Baker's cyst.   Rogenia Werntz, RVT 07/05/2014, 7:56 PM

## 2014-07-05 NOTE — ED Notes (Signed)
Patient transported to CT 

## 2014-07-05 NOTE — ED Notes (Signed)
Pt back from CT

## 2014-07-05 NOTE — Discharge Instructions (Signed)
Deep Vein Thrombosis °A deep vein thrombosis (DVT) is a blood clot that develops in the deep, larger veins of the leg, arm, or pelvis. These are more dangerous than clots that might form in veins near the surface of the body. A DVT can lead to serious and even life-threatening complications if the clot breaks off and travels in the bloodstream to the lungs.  °A DVT can damage the valves in your leg veins so that instead of flowing upward, the blood pools in the lower leg. This is called post-thrombotic syndrome, and it can result in pain, swelling, discoloration, and sores on the leg. °CAUSES °Usually, several things contribute to the formation of blood clots. Contributing factors include: °· The flow of blood slows down. °· The inside of the vein is damaged in some way. °· You have a condition that makes blood clot more easily. °RISK FACTORS °Some people are more likely than others to develop blood clots. Risk factors include:  °· Smoking. °· Being overweight (obese). °· Sitting or lying still for a long time. This includes long-distance travel, paralysis, or recovery from an illness or surgery. °Other factors that increase risk are:  °· Older age, especially over 75 years of age. °· Having a family history of blood clots or if you have already had a blot clot. °· Having major or lengthy surgery. This is especially true for surgery on the hip, knee, or belly (abdomen). Hip surgery is particularly high risk. °· Having a long, thin tube (catheter) placed inside a vein during a medical procedure. °· Breaking a hip or leg. °· Having cancer or cancer treatment. °· Pregnancy and childbirth. °¨ Hormone changes make the blood clot more easily during pregnancy. °¨ The fetus puts pressure on the veins of the pelvis. °¨ There is a risk of injury to veins during delivery or a caesarean delivery. The risk is highest just after childbirth. °· Medicines containing the male hormone estrogen. This includes birth control pills and  hormone replacement therapy. °· Other circulation or heart problems. ° °SIGNS AND SYMPTOMS °When a clot forms, it can either partially or totally block the blood flow in that vein. Symptoms of a DVT can include: °· Swelling of the leg or arm, especially if one side is much worse. °· Warmth and redness of the leg or arm, especially if one side is much worse. °· Pain in an arm or leg. If the clot is in the leg, symptoms may be more noticeable or worse when standing or walking. °The symptoms of a DVT that has traveled to the lungs (pulmonary embolism, PE) usually start suddenly and include: °· Shortness of breath. °· Coughing. °· Coughing up blood or blood-tinged mucus. °· Chest pain. The chest pain is often worse with deep breaths. °· Rapid heartbeat. °Anyone with these symptoms should get emergency medical treatment right away. Do not wait to see if the symptoms will go away. Call your local emergency services (911 in the [place] if you have these symptoms. Do not drive yourself to the hospital. °DIAGNOSIS °If a DVT is suspected, your health care provider will take a full medical history and perform a physical exam. Tests that also may be required include: °· Blood tests, including studies of the clotting properties of the blood. °· Ultrasound to see if you have clots in your legs or lungs. °· X-rays to show the flow of blood when dye is injected into the veins (venogram). °· Studies of your lungs if you have any   chest symptoms. °PREVENTION °· Exercise the legs regularly. Take a brisk 30-minute walk every day. °· Maintain a weight that is appropriate for your height. °· Avoid sitting or lying in bed for long periods of time without moving your legs. °· Women, particularly those over the age of 35 years, should consider the risks and benefits of taking estrogen medicines, including birth control pills. °· Do not smoke, especially if you take estrogen medicines. °· Long-distance travel can increase your risk of DVT. You  should exercise your legs by walking or pumping the muscles every hour. °· Many of the risk factors above relate to situations that exist with hospitalization, either for illness, injury, or elective surgery. Prevention may include medical and nonmedical measures. °¨ Your health care provider will assess you for the need for venous thromboembolism prevention when you are admitted to the hospital. If you are having surgery, your surgeon will assess you the day of or day after surgery. °TREATMENT °Once identified, a DVT can be treated. It can also be prevented in some circumstances. Once you have had a DVT, you may be at increased risk for a DVT in the future. The most common treatment for DVT is blood-thinning (anticoagulant) medicine, which reduces the blood's tendency to clot. Anticoagulants can stop new blood clots from forming and stop old clots from growing. They cannot dissolve existing clots. Your body does this by itself over time. Anticoagulants can be given by mouth, through an IV tube, or by injection. Your health care provider will determine the best program for you. Other medicines or treatments that may be used are: °· Heparin or related medicines (low molecular weight heparin) are often the first treatment for a blood clot. They act quickly. However, they cannot be taken orally and must be given either in shot form or by IV tube. °¨ Heparin can cause a fall in a component of blood that stops bleeding and forms blood clots (platelets). You will be monitored with blood tests to be sure this does not occur. °· Warfarin is an anticoagulant that can be swallowed. It takes a few days to start working, so usually heparin or related medicines are used in combination. Once warfarin is working, heparin is usually stopped. °· Factor Xa inhibitor medicines, such as rivaroxaban and apixaban, also reduce blood clotting. These medicines are taken orally and can often be used without heparin or related  medicines. °· Less commonly, clot dissolving drugs (thrombolytics) are used to dissolve a DVT. They carry a high risk of bleeding, so they are used mainly in severe cases where your life or a part of your body is threatened. °· Very rarely, a blood clot in the leg needs to be removed surgically. °· If you are unable to take anticoagulants, your health care provider may arrange for you to have a filter placed in a main vein in your abdomen. This filter prevents clots from traveling to your lungs. °HOME CARE INSTRUCTIONS °· Take all medicines as directed by your health care provider. °· Learn as much as you can about DVT. °· Wear a medical alert bracelet or carry a medical alert card. °· Ask your health care provider how soon you can go back to normal activities. It is important to stay active to prevent blood clots. If you are on anticoagulant medicine, avoid contact sports. °· It is very important to exercise. This is especially important while traveling, sitting, or standing for long periods of time. Exercise your legs by walking or by   tightening and relaxing your leg muscles regularly. Take frequent walks. °· You may need to wear compression stockings. These are tight elastic stockings that apply pressure to the lower legs. This pressure can help keep the blood in the legs from clotting. °Taking Warfarin °Warfarin is a daily medicine that is taken by mouth. Your health care provider will advise you on the length of treatment (usually 3-6 months, sometimes lifelong). If you take warfarin: °· Understand how to take warfarin and foods that can affect how warfarin works in your body. °· Too much and too little warfarin are both dangerous. Too much warfarin increases the risk of bleeding. Too little warfarin continues to allow the risk for blood clots. °Warfarin and Regular Blood Testing °While taking warfarin, you will need to have regular blood tests to measure your blood clotting time. These blood tests usually  include both the prothrombin time (PT) and international normalized ratio (INR) tests. The PT and INR results allow your health care provider to adjust your dose of warfarin. It is very important that you have your PT and INR tested as often as directed by your health care provider.    °Warfarin and Your Diet °Avoid major changes in your diet, or notify your health care provider before changing your diet. Arrange a visit with a registered dietitian to answer your questions. Many foods, especially foods high in vitamin K, can interfere with warfarin and affect the PT and INR results. You should eat a consistent amount of foods high in vitamin K. Foods high in vitamin K include:  °· Spinach, kale, broccoli, cabbage, collard and turnip greens, Brussels sprouts, peas, cauliflower, seaweed, and parsley. °· Beef and pork liver. °· Green tea. °· Soybean oil. °Warfarin with Other Medicines °Many medicines can interfere with warfarin and affect the PT and INR results. You must: °· Tell your health care provider about any and all medicines, vitamins, and supplements you take, including aspirin and other over-the-counter anti-inflammatory medicines. Be especially cautious with aspirin and anti-inflammatory medicines. Ask your health care provider before taking these. °· Do not take or discontinue any prescribed or over-the-counter medicine except on the advice of your health care provider or pharmacist. °Warfarin Side Effects °Warfarin can have side effects, such as easy bruising and difficulty stopping bleeding. Ask your health care provider or pharmacist about other side effects of warfarin. You will need to: °· Hold pressure over cuts for longer than usual. °· Notify your dentist and other health care providers that you are taking warfarin before you undergo any procedures where bleeding may occur. °Warfarin with Alcohol and Tobacco  °· Drinking alcohol frequently can increase the effect of warfarin, leading to excess  bleeding. It is best to avoid alcoholic drinks or to consume only very small amounts while taking warfarin. Notify your health care provider if you change your alcohol intake.   °· Do not use any tobacco products including cigarettes, chewing tobacco, or electronic cigarettes. If you smoke, quit. Ask your health care provider for help with quitting smoking. °Alternative Medicines to Warfarin: Factor Xa Inhibitor Medicines °· These blood-thinning medicines are taken by mouth, usually for several weeks or longer. It is important to take the medicine every single day at the same time each day. °· There are no regular blood tests required when using these medicines. °· There are fewer food and drug interactions than with warfarin. °· The side effects of this class of medicine are similar to those of warfarin, including excessive bruising or bleeding. Ask your   health care provider or pharmacist about other potential side effects. SEEK MEDICAL CARE IF:  You notice a rapid heartbeat.  You feel weaker or more tired than usual.  You feel faint.  You notice increased bruising.  You feel your symptoms are not getting better in the time expected.  You believe you are having side effects of medicine. SEEK IMMEDIATE MEDICAL CARE IF:  You have chest pain.  You have trouble breathing.  You have new or increased swelling or pain in one leg.  You cough up blood.  You notice blood in vomit, in a bowel movement, or in urine. MAKE SURE YOU:  Understand these instructions.  Will watch your condition.  Will get help right away if you are not doing well or get worse. Document Released: 04/06/2005 Document Revised: 08/21/2013 Document Reviewed: 12/12/2012 [facility] Patient Information 2015 [place], [place]. This information is not intended to replace advice given to you by your health care provider. Make sure you discuss any questions you have with your health care provider.  Please follow up with PCP  tomorrow as discussed. Please monitor for worsening signs and symptoms including chest pain, SOB, cough, fever, or increased pain swelling of distal extremity. Please return immediately if any of those signs or symptoms present.

## 2014-07-06 MED ORDER — RIVAROXABAN 15 MG PO TABS
15.0000 mg | ORAL_TABLET | Freq: Once | ORAL | Status: AC
  Administered 2014-07-06: 15 mg via ORAL
  Filled 2014-07-06: qty 1

## 2014-07-06 NOTE — ED Provider Notes (Signed)
Medical screening examination/treatment/procedure(s) were conducted as a shared visit with non-physician practitioner(s) and myself.  I personally evaluated the patient during the encounter.   81yo M, c/o LLE "swelling" since yesterday. Pt s/p lap right inguinal hernia and umbilical hernia repair with mesh on 06/28/14. 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 Pt and his family are concerned regarding both today. Denies CP/palpitations, no SOB/cough, no fevers.  VSS, NAD, A&O, CTA, RRR, tender abd wall surgical site/otherwise NT, neuro non-focal, +left calf edema with asymmetry compared to right. No rash, no ecchymosis. NT left knee/ankle/foot. NMS intact left foot.  Vasc US +LLE DVT. CT-A negative for PE. Workup otherwise reassuring. 2310:  T/C to General Surgeon Dr. Grandville Silos, case discussed, including:  HPI, pertinent PM/SHx, VS/PE, dx testing, ED course and treatment:  OK to start anticoagulation to tx DVT, he will update pt's Surgeon in the morning. Pt given option of xarelto vs coumadin/lovenox. Pt and family prefer to start xarelto. Pt states he "feels good" and wants to go home now. Will give first dose in the ED, rx for same, f/u PMD.  Dx and testing d/w pt and family.  Questions answered.  Verb understanding, agreeable to d/c home with outpt f/u in the next 1 to 2 days.    VASCULAR LAB PRELIMINARY PRELIMINARY PRELIMINARY PRELIMINARY Bilateral lower extremity venous duplex completed.  Preliminary report: Right: No evidence of DVT, superficial thrombosis, or Baker's cyst. Left: DVT noted on the tibial peroneal trunk. No evidence of superficial thrombosis. No Baker's cyst.  CESTONE, HELENE, RVT 07/05/2014, 7:56 PM   EKG Interpretation   Date/Time:  Thursday July 05 2014 17:05:05 EDT Ventricular Rate:  59 PR Interval:  190 QRS Duration: 84 QT Interval:  422 QTC Calculation: 417 R Axis:   6 Text Interpretation:  Sinus bradycardia Otherwise normal ECG No old  tracing  to compare Confirmed by [facility]  MD, Nunzio Cory [phone]) on  07/05/2014 9:24:11 PM      Results for orders placed or performed during the hospital encounter of 68/34/19  Basic metabolic panel  Result Value Ref Range   Sodium 138 135 - 145 mmol/L   Potassium 4.2 3.5 - 5.1 mmol/L   Chloride 105 96 - 112 mmol/L   CO2 25 19 - 32 mmol/L   Glucose, Bld 111 (H) 70 - 99 mg/dL   BUN 19 6 - 23 mg/dL   Creatinine, Ser 1.09 0.50 - 1.35 mg/dL   Calcium 9.0 8.4 - 10.5 mg/dL   GFR calc non Af Amer 63 (L) >90 mL/min   GFR calc Af Amer 73 (L) >90 mL/min   Anion gap 8 5 - 15  BNP (order ONLY if patient complains of dyspnea/SOB AND you have documented it for THIS visit)  Result Value Ref Range   B Natriuretic Peptide 91.0 0.0 - 100.0 pg/mL  CBC with Differential  Result Value Ref Range   WBC 9.7 4.0 - 10.5 K/uL   RBC 4.48 4.22 - 5.81 MIL/uL   Hemoglobin 14.0 13.0 - 17.0 g/dL   HCT 41.8 39.0 - 52.0 %   MCV 93.3 78.0 - 100.0 fL   MCH 31.3 26.0 - 34.0 pg   MCHC 33.5 30.0 - 36.0 g/dL   RDW 13.2 11.5 - 15.5 %   Platelets 186 150 - 400 K/uL   Neutrophils Relative % 66 43 - 77 %   Neutro Abs 6.4 1.7 - 7.7 K/uL   Lymphocytes Relative 20 12 - 46 %  Lymphs Abs 2.0 0.7 - 4.0 K/uL   Monocytes Relative 11 3 - 12 %   Monocytes Absolute 1.1 (H) 0.1 - 1.0 K/uL   Eosinophils Relative 3 0 - 5 %   Eosinophils Absolute 0.3 0.0 - 0.7 K/uL   Basophils Relative 0 0 - 1 %   Basophils Absolute 0.0 0.0 - 0.1 K/uL  I-stat troponin, ED (not at [facility])  Result Value Ref Range   Troponin i, poc 0.00 0.00 - 0.08 ng/mL   Comment 3            Dg Chest 2 View 07/05/2014   CLINICAL DATA:  Shortness of breath.  EXAM: CHEST  2 VIEW  COMPARISON:  November 24, 2006.  FINDINGS: The heart size and mediastinal contours are within normal limits. Both lungs are clear. No pneumothorax or pleural effusion is noted. Stable compression deformity of mid thoracic vertebral body is noted consistent with old fracture.  IMPRESSION: No active  cardiopulmonary disease.   Electronically Signed   By: Marijo Conception, M.D.   On: 07/05/2014 17:20   Ct Angio Chest Pe W/cm &/or Wo Cm 07/05/2014   CLINICAL DATA:  Acute onset of left leg swelling. History of deep venous thrombosis. Near syncope. Initial encounter.  EXAM: CT ANGIOGRAPHY CHEST WITH CONTRAST  TECHNIQUE: Multidetector CT imaging of the chest was performed using the standard protocol during bolus administration of intravenous contrast. Multiplanar CT image reconstructions and MIPs were obtained to evaluate the vascular anatomy.  CONTRAST:  173mL OMNIPAQUE IOHEXOL 350 MG/ML SOLN  COMPARISON:  Chest radiograph performed earlier today at 5:09 p.m., and CTA of the chest performed 12/04/2005  FINDINGS: There is no evidence of pulmonary embolus.  Mild bibasilar atelectasis is noted. The lungs are otherwise clear. There is no evidence of significant focal consolidation, pleural effusion or pneumothorax. No masses are identified; no abnormal focal contrast enhancement is seen.  Diffuse coronary artery calcifications are seen. No mediastinal lymphadenopathy is appreciated. No pericardial effusion is identified. The great vessels are grossly unremarkable in appearance. No axillary lymphadenopathy is seen. The visualized portions of the thyroid gland are unremarkable in appearance.  A 1.1 cm hypodensity is noted within the left hepatic lobe. The visualized portions of the liver and spleen are otherwise grossly unremarkable.  No acute osseous abnormalities are seen. There is chronic compression deformity involving vertebral body T6.  Review of the MIP images confirms the above findings.  IMPRESSION: 1. No evidence of pulmonary embolus. 2. Mild bibasilar atelectasis noted.  Lungs otherwise clear. 3. Diffuse coronary artery calcifications seen. 4. Likely small hepatic cyst noted, relatively stable from 2007. 5. Chronic compression deformity at vertebral body T6.   Electronically Signed   By: Garald Balding M.D.    On: 07/05/2014 21:39       Francine Graven, DO 07/08/14 [phone]

## 2014-07-06 NOTE — ED Notes (Signed)
Pharmacist at bedside.

## 2016-03-16 IMAGING — DX DG CHEST 2V
2 series · 2 of 2 positions shown · non-contrast
Comparison: November 24, 2006.

CLINICAL DATA: Shortness of breath.

EXAM:
CHEST  2 VIEW

[chest pa]
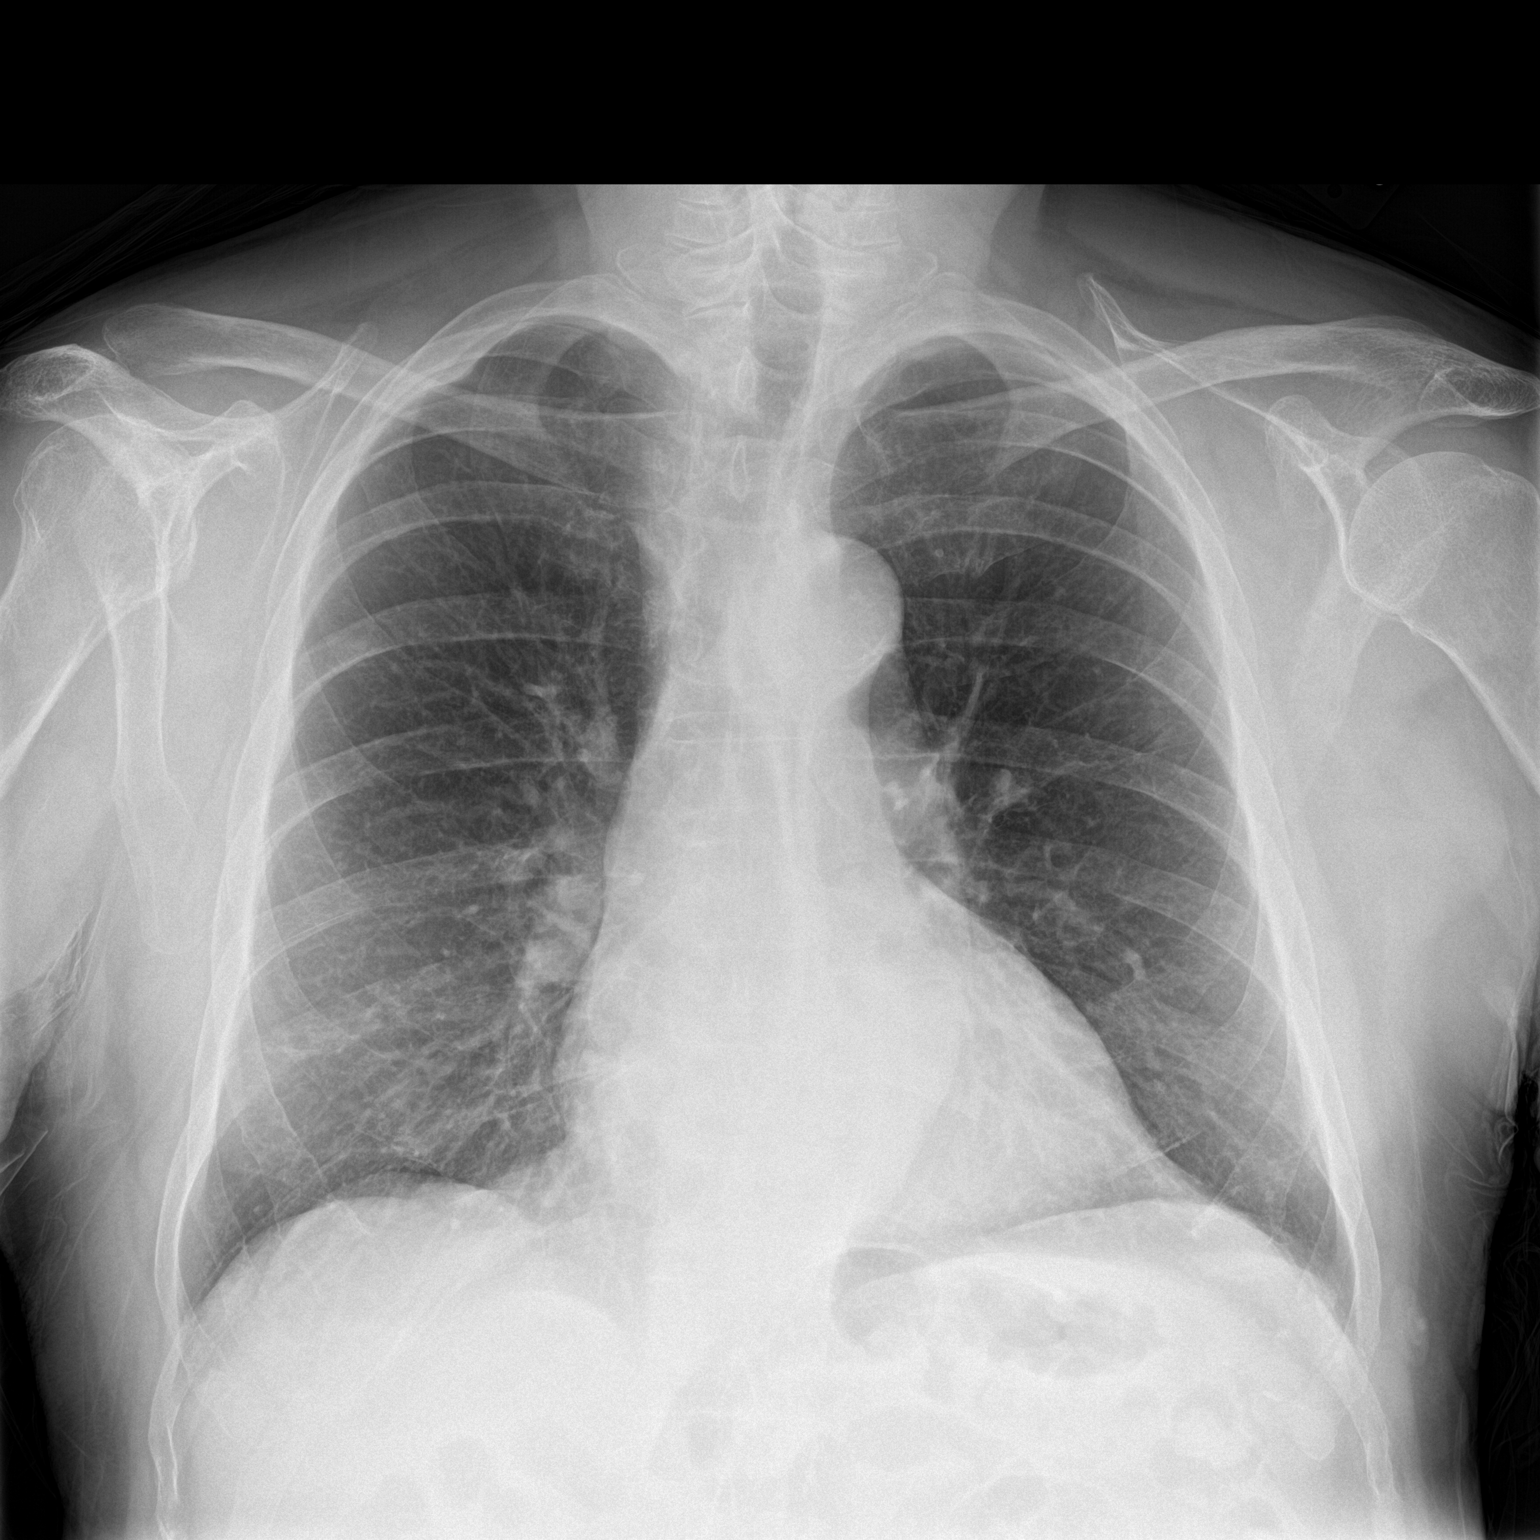

[chest lat]
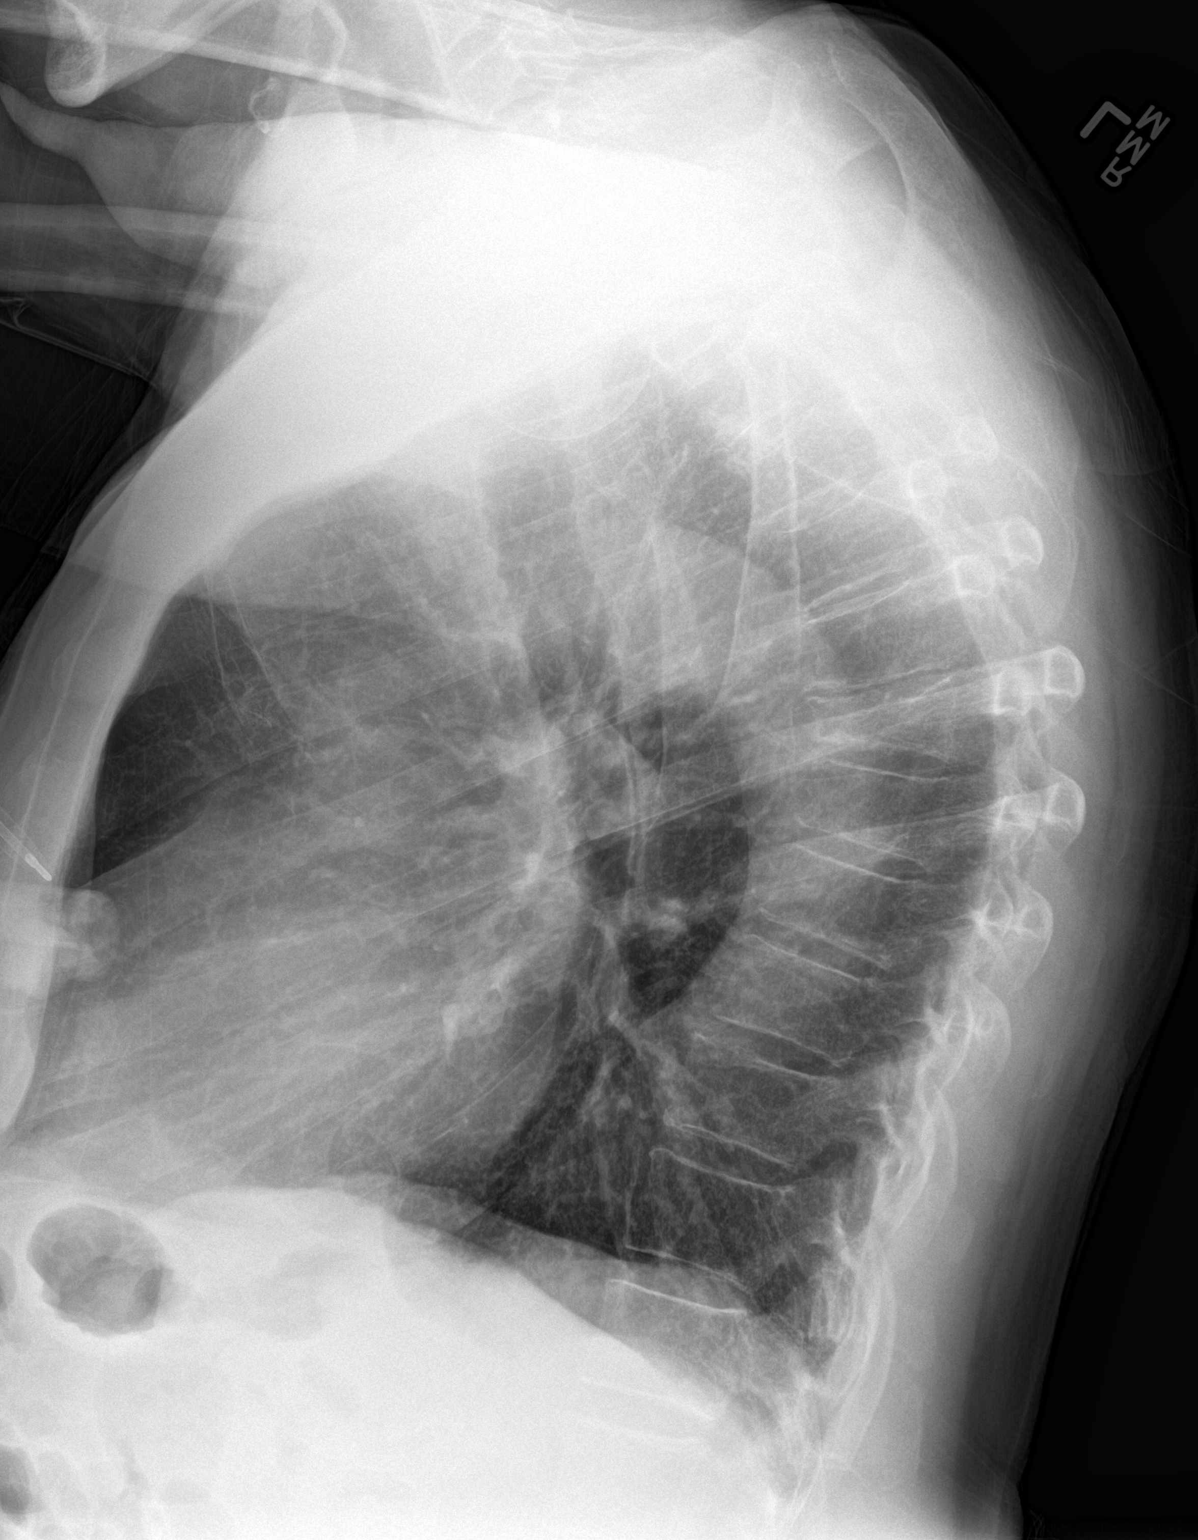

[2 of 2 positions shown; findings below may reference images not displayed]

FINDINGS: The heart size and mediastinal contours are within normal limits.
Both lungs are clear. No pneumothorax or pleural effusion is noted.
Stable compression deformity of mid thoracic vertebral body is noted
consistent with old fracture.
IMPRESSION: No active cardiopulmonary disease.

## 2016-03-16 IMAGING — CT CT ANGIO CHEST
2 of 9 series · 19 of 46 positions shown · IV contrast (Omni 300)
Comparison: Chest radiograph performed earlier today at [DATE] p.m.,
and CTA of the chest performed 12/04/2005

CLINICAL DATA: Acute onset of left leg swelling. History of deep
venous thrombosis. Near syncope. Initial encounter.

EXAM:
CT ANGIOGRAPHY CHEST WITH CONTRAST
TECHNIQUE: Multidetector CT imaging of the chest was performed using the
standard protocol during bolus administration of intravenous
contrast. Multiplanar CT image reconstructions and MIPs were
obtained to evaluate the vascular anatomy.
CONTRAST:  100mL OMNIPAQUE IOHEXOL 350 MG/ML SOLN

[Series 5: thins · axial · 0.61mm/px · z∈[-263,-35]mm · 16 of 258 slices shown]
[im 15/258  lung]
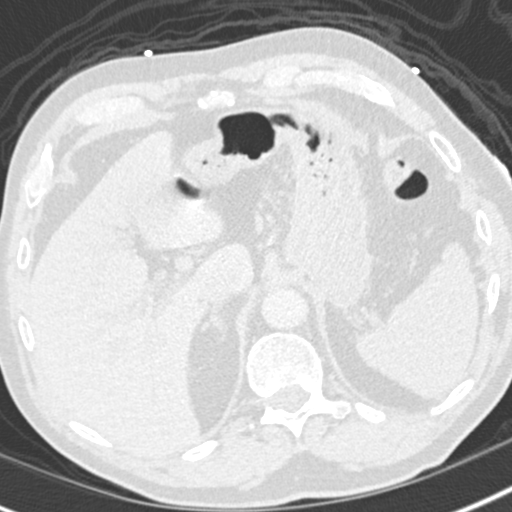
[im 29/258  soft-tissue]
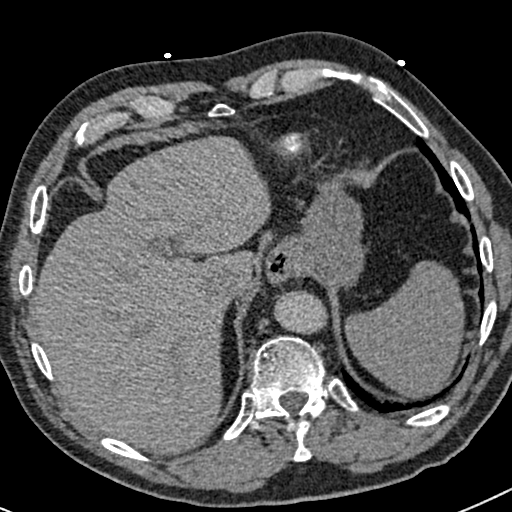
[im 43/258  lung]
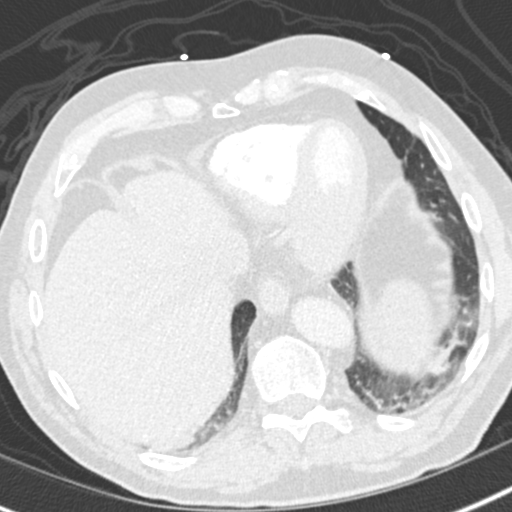
[im 58/258  soft-tissue]
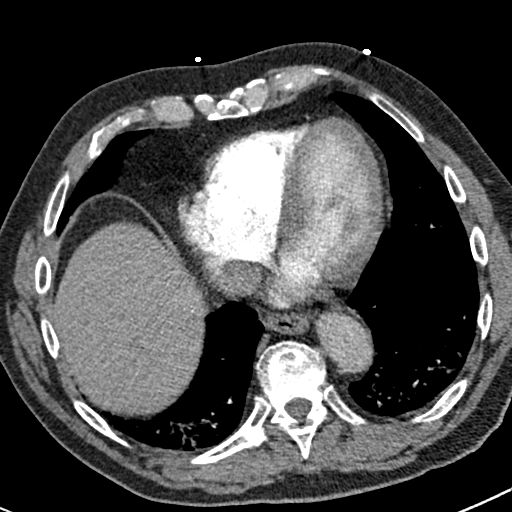
[im 72/258  lung]
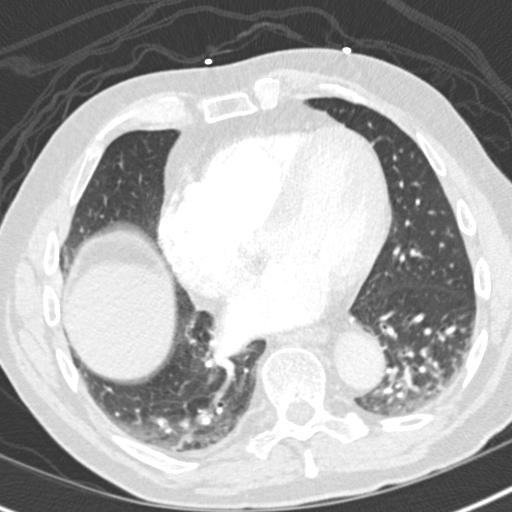
[im 86/258  soft-tissue]
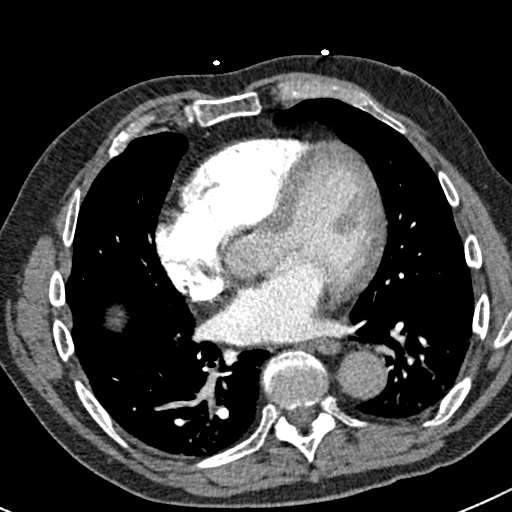
[im 100/258  lung]
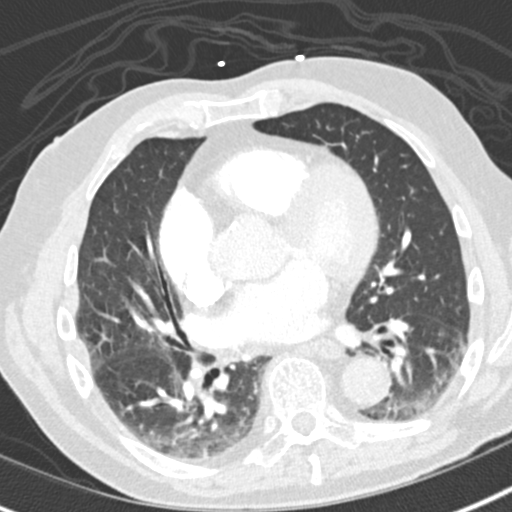
[im 115/258  soft-tissue]
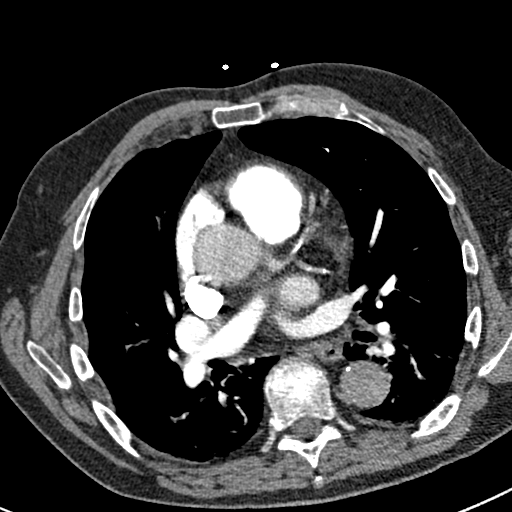
[im 143/258  lung]
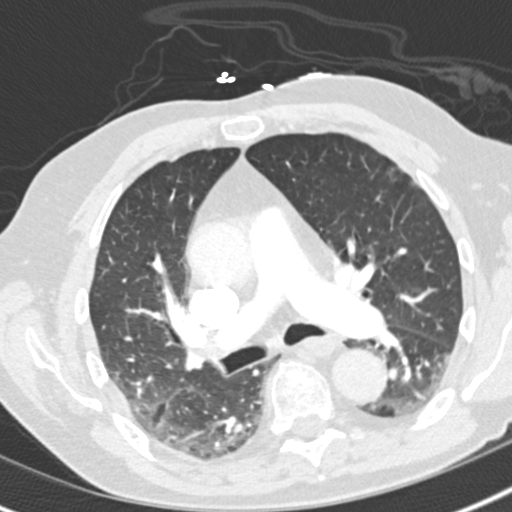
[im 158/258  soft-tissue]
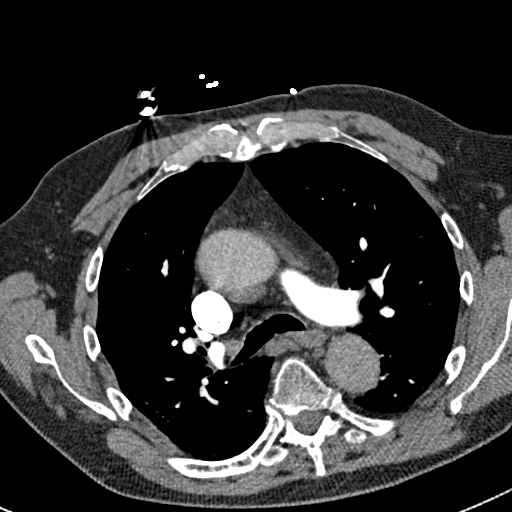
[im 172/258  lung]
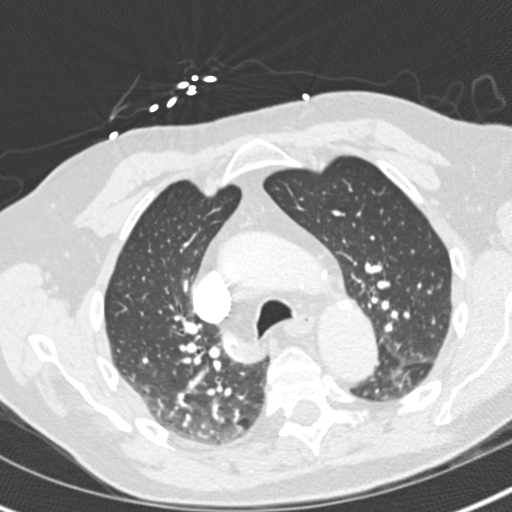
[im 186/258  soft-tissue]
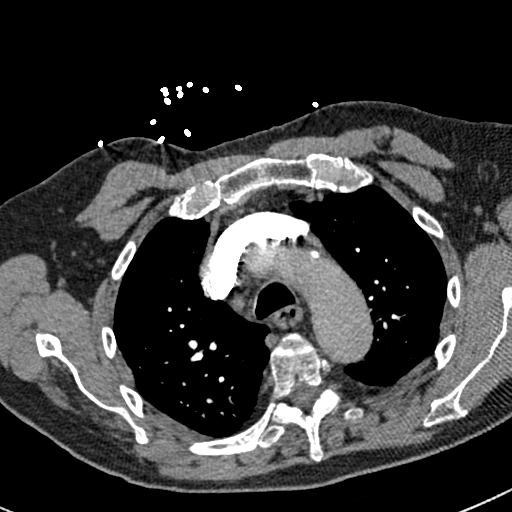
[im 200/258  lung]
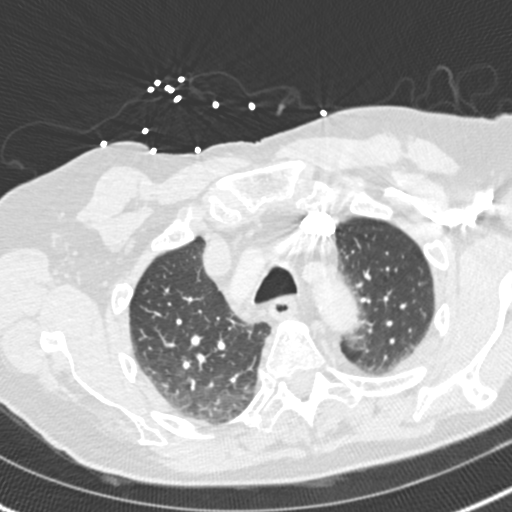
[im 215/258  soft-tissue]
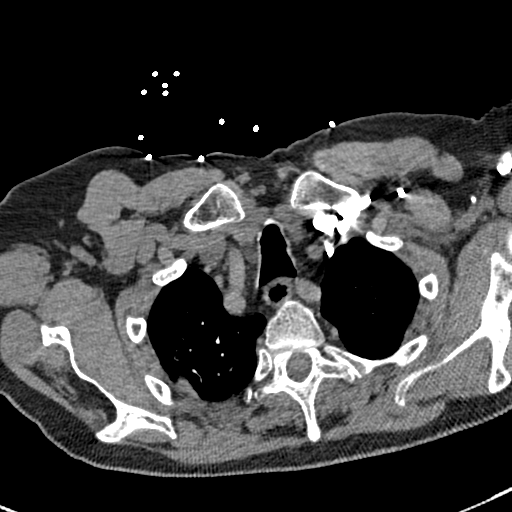
[im 229/258  lung]
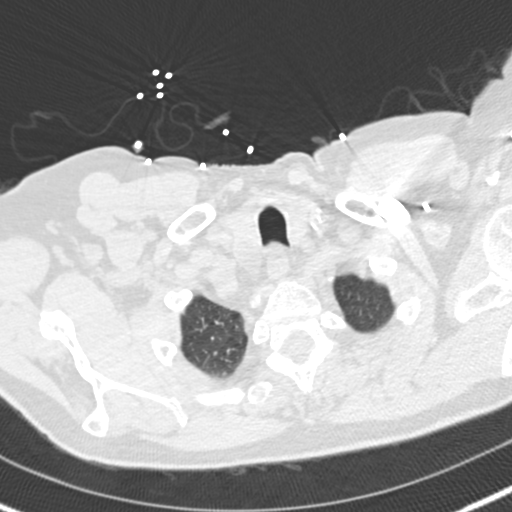
[im 243/258  soft-tissue]
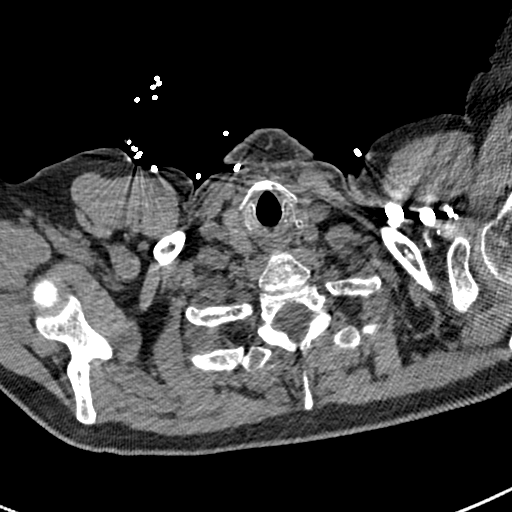

[Series 7: coronal mpr · coronal · 0.59mm/px · 3 of 118 slices shown]
[im 30/118  soft-tissue]
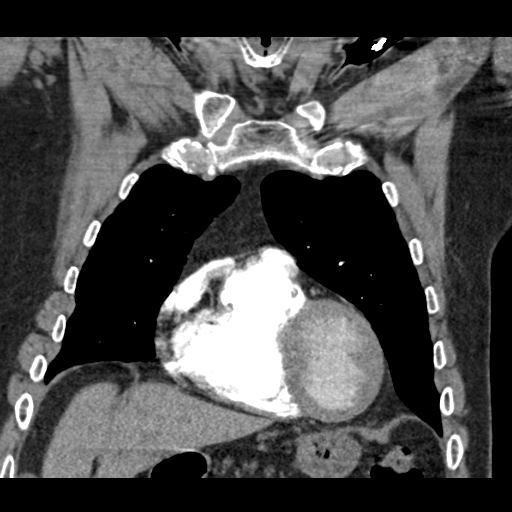
[im 59/118  soft-tissue]
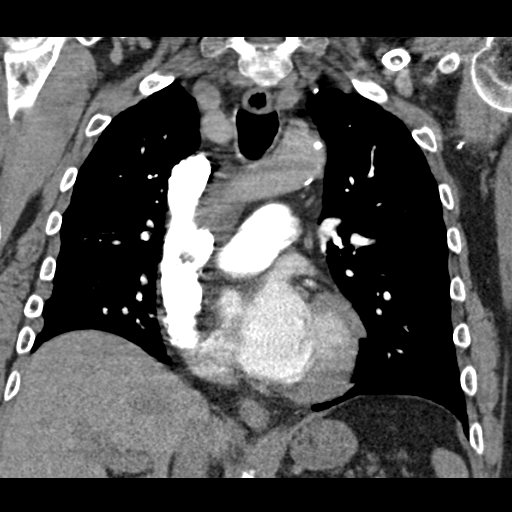
[im 88/118  soft-tissue]
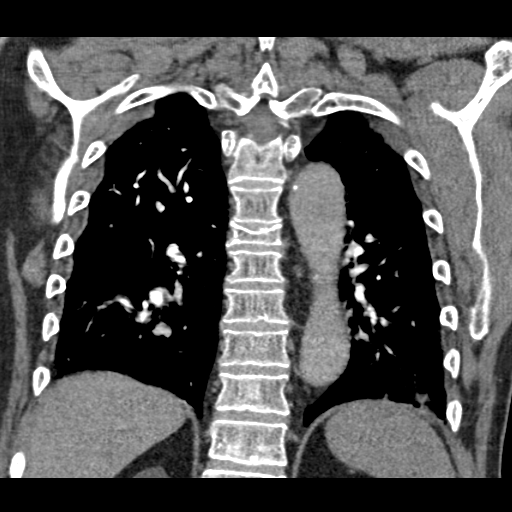

[19 of 46 positions shown; findings below may reference images not displayed]

FINDINGS: There is no evidence of pulmonary embolus.

Mild bibasilar atelectasis is noted. The lungs are otherwise clear.
There is no evidence of significant focal consolidation, pleural
effusion or pneumothorax. No masses are identified; no abnormal
focal contrast enhancement is seen.

Diffuse coronary artery calcifications are seen. No mediastinal
lymphadenopathy is appreciated. No pericardial effusion is
identified. The great vessels are grossly unremarkable in
appearance. No axillary lymphadenopathy is seen. The visualized
portions of the thyroid gland are unremarkable in appearance.

A 1.1 cm hypodensity is noted within the left hepatic lobe. The
visualized portions of the liver and spleen are otherwise grossly
unremarkable.

No acute osseous abnormalities are seen. There is chronic
compression deformity involving vertebral body T6.

Review of the MIP images confirms the above findings.
IMPRESSION: 1. No evidence of pulmonary embolus.
2. Mild bibasilar atelectasis noted.  Lungs otherwise clear.
3. Diffuse coronary artery calcifications seen.
4. Likely small hepatic cyst noted, relatively stable from 3112.
5. Chronic compression deformity at vertebral body T6.

## 2020-04-25 DIAGNOSIS — H26493 Other secondary cataract, bilateral: Secondary | ICD-10-CM | POA: Diagnosis not present

## 2020-07-29 DIAGNOSIS — K5900 Constipation, unspecified: Secondary | ICD-10-CM | POA: Diagnosis not present

## 2020-10-16 DIAGNOSIS — R7303 Prediabetes: Secondary | ICD-10-CM | POA: Diagnosis not present

## 2020-10-16 DIAGNOSIS — Z0000 Encounter for general adult medical examination without abnormal findings: Secondary | ICD-10-CM | POA: Diagnosis not present

## 2020-10-16 DIAGNOSIS — E7800 Pure hypercholesterolemia, unspecified: Secondary | ICD-10-CM | POA: Diagnosis not present

## 2020-10-16 DIAGNOSIS — J309 Allergic rhinitis, unspecified: Secondary | ICD-10-CM | POA: Diagnosis not present

## 2020-11-26 DIAGNOSIS — H353121 Nonexudative age-related macular degeneration, left eye, early dry stage: Secondary | ICD-10-CM | POA: Diagnosis not present

## 2020-11-26 DIAGNOSIS — H35373 Puckering of macula, bilateral: Secondary | ICD-10-CM | POA: Diagnosis not present

## 2021-01-28 DIAGNOSIS — Z23 Encounter for immunization: Secondary | ICD-10-CM | POA: Diagnosis not present

## 2021-02-26 DIAGNOSIS — H5702 Anisocoria: Secondary | ICD-10-CM | POA: Diagnosis not present

## 2021-06-04 DIAGNOSIS — D23112 Other benign neoplasm of skin of right lower eyelid, including canthus: Secondary | ICD-10-CM | POA: Diagnosis not present

## 2021-10-29 DIAGNOSIS — J309 Allergic rhinitis, unspecified: Secondary | ICD-10-CM | POA: Diagnosis not present

## 2021-10-29 DIAGNOSIS — R7303 Prediabetes: Secondary | ICD-10-CM | POA: Diagnosis not present

## 2021-10-29 DIAGNOSIS — E7800 Pure hypercholesterolemia, unspecified: Secondary | ICD-10-CM | POA: Diagnosis not present

## 2021-10-29 DIAGNOSIS — Z0000 Encounter for general adult medical examination without abnormal findings: Secondary | ICD-10-CM | POA: Diagnosis not present

## 2021-12-08 DIAGNOSIS — H35373 Puckering of macula, bilateral: Secondary | ICD-10-CM | POA: Diagnosis not present

## 2021-12-08 DIAGNOSIS — D23112 Other benign neoplasm of skin of right lower eyelid, including canthus: Secondary | ICD-10-CM | POA: Diagnosis not present

## 2021-12-08 DIAGNOSIS — H353121 Nonexudative age-related macular degeneration, left eye, early dry stage: Secondary | ICD-10-CM | POA: Diagnosis not present

## 2022-01-29 DIAGNOSIS — Z23 Encounter for immunization: Secondary | ICD-10-CM | POA: Diagnosis not present

## 2022-06-10 DIAGNOSIS — H353121 Nonexudative age-related macular degeneration, left eye, early dry stage: Secondary | ICD-10-CM | POA: Diagnosis not present

## 2022-06-10 DIAGNOSIS — D23112 Other benign neoplasm of skin of right lower eyelid, including canthus: Secondary | ICD-10-CM | POA: Diagnosis not present

## 2022-06-10 DIAGNOSIS — H5702 Anisocoria: Secondary | ICD-10-CM | POA: Diagnosis not present

## 2022-06-10 DIAGNOSIS — Z961 Presence of intraocular lens: Secondary | ICD-10-CM | POA: Diagnosis not present

## 2022-07-16 DIAGNOSIS — H0279 Other degenerative disorders of eyelid and periocular area: Secondary | ICD-10-CM | POA: Diagnosis not present

## 2022-07-16 DIAGNOSIS — H02132 Senile ectropion of right lower eyelid: Secondary | ICD-10-CM | POA: Diagnosis not present

## 2022-07-16 DIAGNOSIS — C441122 Basal cell carcinoma of skin of right lower eyelid, including canthus: Secondary | ICD-10-CM | POA: Diagnosis not present

## 2022-07-16 DIAGNOSIS — H5702 Anisocoria: Secondary | ICD-10-CM | POA: Diagnosis not present

## 2022-07-16 DIAGNOSIS — D485 Neoplasm of uncertain behavior of skin: Secondary | ICD-10-CM | POA: Diagnosis not present

## 2022-07-16 DIAGNOSIS — H02135 Senile ectropion of left lower eyelid: Secondary | ICD-10-CM | POA: Diagnosis not present

## 2022-09-22 DIAGNOSIS — C44319 Basal cell carcinoma of skin of other parts of face: Secondary | ICD-10-CM | POA: Diagnosis not present

## 2022-09-22 DIAGNOSIS — D485 Neoplasm of uncertain behavior of skin: Secondary | ICD-10-CM | POA: Diagnosis not present

## 2022-09-22 DIAGNOSIS — C441122 Basal cell carcinoma of skin of right lower eyelid, including canthus: Secondary | ICD-10-CM | POA: Diagnosis not present

## 2022-09-23 DIAGNOSIS — C441122 Basal cell carcinoma of skin of right lower eyelid, including canthus: Secondary | ICD-10-CM | POA: Diagnosis not present

## 2022-09-23 DIAGNOSIS — Z483 Aftercare following surgery for neoplasm: Secondary | ICD-10-CM | POA: Diagnosis not present

## 2022-09-23 DIAGNOSIS — H0289 Other specified disorders of eyelid: Secondary | ICD-10-CM | POA: Diagnosis not present

## 2022-09-23 DIAGNOSIS — S01111A Laceration without foreign body of right eyelid and periocular area, initial encounter: Secondary | ICD-10-CM | POA: Diagnosis not present

## 2022-09-23 DIAGNOSIS — Z85828 Personal history of other malignant neoplasm of skin: Secondary | ICD-10-CM | POA: Diagnosis not present

## 2022-09-23 DIAGNOSIS — Z481 Encounter for planned postprocedural wound closure: Secondary | ICD-10-CM | POA: Diagnosis not present

## 2022-09-23 DIAGNOSIS — L988 Other specified disorders of the skin and subcutaneous tissue: Secondary | ICD-10-CM | POA: Diagnosis not present

## 2022-10-27 DIAGNOSIS — C44319 Basal cell carcinoma of skin of other parts of face: Secondary | ICD-10-CM | POA: Diagnosis not present

## 2022-10-27 DIAGNOSIS — L821 Other seborrheic keratosis: Secondary | ICD-10-CM | POA: Diagnosis not present

## 2022-11-05 DIAGNOSIS — E7800 Pure hypercholesterolemia, unspecified: Secondary | ICD-10-CM | POA: Diagnosis not present

## 2022-11-05 DIAGNOSIS — J309 Allergic rhinitis, unspecified: Secondary | ICD-10-CM | POA: Diagnosis not present

## 2022-11-05 DIAGNOSIS — Z23 Encounter for immunization: Secondary | ICD-10-CM | POA: Diagnosis not present

## 2022-11-05 DIAGNOSIS — Z0000 Encounter for general adult medical examination without abnormal findings: Secondary | ICD-10-CM | POA: Diagnosis not present

## 2022-11-05 DIAGNOSIS — R7303 Prediabetes: Secondary | ICD-10-CM | POA: Diagnosis not present

## 2022-11-05 DIAGNOSIS — Z86711 Personal history of pulmonary embolism: Secondary | ICD-10-CM | POA: Diagnosis not present

## 2023-02-04 DIAGNOSIS — Z23 Encounter for immunization: Secondary | ICD-10-CM | POA: Diagnosis not present

## 2023-03-08 DIAGNOSIS — H0231 Blepharochalasis right upper eyelid: Secondary | ICD-10-CM | POA: Diagnosis not present

## 2023-03-08 DIAGNOSIS — H0234 Blepharochalasis left upper eyelid: Secondary | ICD-10-CM | POA: Diagnosis not present

## 2023-03-08 DIAGNOSIS — H0279 Other degenerative disorders of eyelid and periocular area: Secondary | ICD-10-CM | POA: Diagnosis not present

## 2023-04-22 DIAGNOSIS — H353121 Nonexudative age-related macular degeneration, left eye, early dry stage: Secondary | ICD-10-CM | POA: Diagnosis not present

## 2023-04-22 DIAGNOSIS — H35373 Puckering of macula, bilateral: Secondary | ICD-10-CM | POA: Diagnosis not present

## 2023-04-22 DIAGNOSIS — D23112 Other benign neoplasm of skin of right lower eyelid, including canthus: Secondary | ICD-10-CM | POA: Diagnosis not present

## 2023-04-22 DIAGNOSIS — H5702 Anisocoria: Secondary | ICD-10-CM | POA: Diagnosis not present

## 2023-04-22 DIAGNOSIS — Z961 Presence of intraocular lens: Secondary | ICD-10-CM | POA: Diagnosis not present

## 2023-09-18 DIAGNOSIS — E7800 Pure hypercholesterolemia, unspecified: Secondary | ICD-10-CM | POA: Diagnosis not present

## 2023-09-18 DIAGNOSIS — M546 Pain in thoracic spine: Secondary | ICD-10-CM | POA: Diagnosis not present

## 2023-09-27 DIAGNOSIS — S20223A Contusion of bilateral back wall of thorax, initial encounter: Secondary | ICD-10-CM | POA: Diagnosis not present

## 2023-10-18 DIAGNOSIS — E7800 Pure hypercholesterolemia, unspecified: Secondary | ICD-10-CM | POA: Diagnosis not present

## 2023-11-18 DIAGNOSIS — E7800 Pure hypercholesterolemia, unspecified: Secondary | ICD-10-CM | POA: Diagnosis not present

## 2023-11-19 ENCOUNTER — Other Ambulatory Visit: Payer: Self-pay | Admitting: Family Medicine

## 2023-11-19 ENCOUNTER — Ambulatory Visit
Admission: RE | Admit: 2023-11-19 | Discharge: 2023-11-19 | Disposition: A | Discharge status: Home / Self Care | Source: Ambulatory Visit | Attending: Family Medicine | Admitting: Family Medicine

## 2023-11-19 DIAGNOSIS — Z0000 Encounter for general adult medical examination without abnormal findings: Secondary | ICD-10-CM | POA: Diagnosis not present

## 2023-11-19 DIAGNOSIS — M545 Low back pain, unspecified: Secondary | ICD-10-CM

## 2023-11-19 DIAGNOSIS — M1611 Unilateral primary osteoarthritis, right hip: Secondary | ICD-10-CM | POA: Diagnosis not present

## 2023-11-19 DIAGNOSIS — Z86711 Personal history of pulmonary embolism: Secondary | ICD-10-CM | POA: Diagnosis not present

## 2023-11-19 DIAGNOSIS — J309 Allergic rhinitis, unspecified: Secondary | ICD-10-CM | POA: Diagnosis not present

## 2023-11-19 DIAGNOSIS — E7800 Pure hypercholesterolemia, unspecified: Secondary | ICD-10-CM | POA: Diagnosis not present

## 2023-11-19 DIAGNOSIS — R7303 Prediabetes: Secondary | ICD-10-CM | POA: Diagnosis not present

## 2023-12-21 DIAGNOSIS — S32030A Wedge compression fracture of third lumbar vertebra, initial encounter for closed fracture: Secondary | ICD-10-CM | POA: Diagnosis not present

## 2023-12-21 DIAGNOSIS — S22050A Wedge compression fracture of T5-T6 vertebra, initial encounter for closed fracture: Secondary | ICD-10-CM | POA: Diagnosis not present

## 2023-12-27 DIAGNOSIS — M545 Low back pain, unspecified: Secondary | ICD-10-CM | POA: Diagnosis not present

## 2024-01-06 DIAGNOSIS — S32030D Wedge compression fracture of third lumbar vertebra, subsequent encounter for fracture with routine healing: Secondary | ICD-10-CM | POA: Diagnosis not present

## 2024-01-06 DIAGNOSIS — S22050D Wedge compression fracture of T5-T6 vertebra, subsequent encounter for fracture with routine healing: Secondary | ICD-10-CM | POA: Diagnosis not present

## 2024-01-26 DIAGNOSIS — M8008XA Age-related osteoporosis with current pathological fracture, vertebra(e), initial encounter for fracture: Secondary | ICD-10-CM | POA: Diagnosis not present

## 2024-01-26 DIAGNOSIS — S32030A Wedge compression fracture of third lumbar vertebra, initial encounter for closed fracture: Secondary | ICD-10-CM | POA: Diagnosis not present

## 2024-02-08 DIAGNOSIS — M545 Low back pain, unspecified: Secondary | ICD-10-CM | POA: Diagnosis not present

## 2024-04-27 ENCOUNTER — Other Ambulatory Visit: Payer: Self-pay

## 2024-04-27 ENCOUNTER — Inpatient Hospital Stay (HOSPITAL_COMMUNITY)
Admission: EM | Admit: 2024-04-27 | Discharge: 2024-05-02 | DRG: 065 | Disposition: A | Discharge status: Rehabilitation Facility | Attending: Family Medicine | Admitting: Family Medicine

## 2024-04-27 ENCOUNTER — Emergency Department (HOSPITAL_COMMUNITY)

## 2024-04-27 ENCOUNTER — Encounter (HOSPITAL_COMMUNITY): Payer: Self-pay

## 2024-04-27 DIAGNOSIS — R29898 Other symptoms and signs involving the musculoskeletal system: Secondary | ICD-10-CM | POA: Diagnosis present

## 2024-04-27 DIAGNOSIS — D696 Thrombocytopenia, unspecified: Secondary | ICD-10-CM | POA: Diagnosis present

## 2024-04-27 DIAGNOSIS — N400 Enlarged prostate without lower urinary tract symptoms: Secondary | ICD-10-CM | POA: Diagnosis present

## 2024-04-27 DIAGNOSIS — I10 Essential (primary) hypertension: Secondary | ICD-10-CM | POA: Diagnosis present

## 2024-04-27 DIAGNOSIS — Z23 Encounter for immunization: Secondary | ICD-10-CM | POA: Diagnosis not present

## 2024-04-27 DIAGNOSIS — Z8673 Personal history of transient ischemic attack (TIA), and cerebral infarction without residual deficits: Secondary | ICD-10-CM | POA: Diagnosis not present

## 2024-04-27 DIAGNOSIS — I639 Cerebral infarction, unspecified: Secondary | ICD-10-CM | POA: Diagnosis not present

## 2024-04-27 DIAGNOSIS — Z86711 Personal history of pulmonary embolism: Secondary | ICD-10-CM | POA: Diagnosis not present

## 2024-04-27 DIAGNOSIS — I82532 Chronic embolism and thrombosis of left popliteal vein: Secondary | ICD-10-CM | POA: Diagnosis present

## 2024-04-27 DIAGNOSIS — I351 Nonrheumatic aortic (valve) insufficiency: Secondary | ICD-10-CM | POA: Diagnosis present

## 2024-04-27 DIAGNOSIS — K5900 Constipation, unspecified: Secondary | ICD-10-CM | POA: Diagnosis not present

## 2024-04-27 DIAGNOSIS — E785 Hyperlipidemia, unspecified: Secondary | ICD-10-CM | POA: Diagnosis present

## 2024-04-27 DIAGNOSIS — Q2112 Patent foramen ovale: Secondary | ICD-10-CM | POA: Diagnosis not present

## 2024-04-27 DIAGNOSIS — I6381 Other cerebral infarction due to occlusion or stenosis of small artery: Secondary | ICD-10-CM | POA: Diagnosis present

## 2024-04-27 DIAGNOSIS — I441 Atrioventricular block, second degree: Secondary | ICD-10-CM | POA: Diagnosis present

## 2024-04-27 DIAGNOSIS — G8194 Hemiplegia, unspecified affecting left nondominant side: Secondary | ICD-10-CM | POA: Diagnosis not present

## 2024-04-27 DIAGNOSIS — E778 Other disorders of glycoprotein metabolism: Secondary | ICD-10-CM | POA: Diagnosis present

## 2024-04-27 DIAGNOSIS — Z85828 Personal history of other malignant neoplasm of skin: Secondary | ICD-10-CM

## 2024-04-27 DIAGNOSIS — I6389 Other cerebral infarction: Secondary | ICD-10-CM

## 2024-04-27 DIAGNOSIS — Z7982 Long term (current) use of aspirin: Secondary | ICD-10-CM | POA: Diagnosis not present

## 2024-04-27 DIAGNOSIS — Z6828 Body mass index (BMI) 28.0-28.9, adult: Secondary | ICD-10-CM | POA: Diagnosis not present

## 2024-04-27 DIAGNOSIS — Z79899 Other long term (current) drug therapy: Secondary | ICD-10-CM

## 2024-04-27 DIAGNOSIS — R739 Hyperglycemia, unspecified: Secondary | ICD-10-CM | POA: Diagnosis present

## 2024-04-27 DIAGNOSIS — R29705 NIHSS score 5: Secondary | ICD-10-CM | POA: Diagnosis present

## 2024-04-27 DIAGNOSIS — R2990 Unspecified symptoms and signs involving the nervous system: Secondary | ICD-10-CM

## 2024-04-27 DIAGNOSIS — I739 Peripheral vascular disease, unspecified: Secondary | ICD-10-CM | POA: Diagnosis not present

## 2024-04-27 DIAGNOSIS — E440 Moderate protein-calorie malnutrition: Secondary | ICD-10-CM | POA: Diagnosis present

## 2024-04-27 DIAGNOSIS — I340 Nonrheumatic mitral (valve) insufficiency: Secondary | ICD-10-CM | POA: Diagnosis not present

## 2024-04-27 DIAGNOSIS — I69354 Hemiplegia and hemiparesis following cerebral infarction affecting left non-dominant side: Secondary | ICD-10-CM | POA: Diagnosis not present

## 2024-04-27 DIAGNOSIS — G8929 Other chronic pain: Secondary | ICD-10-CM | POA: Diagnosis present

## 2024-04-27 DIAGNOSIS — I63531 Cerebral infarction due to unspecified occlusion or stenosis of right posterior cerebral artery: Secondary | ICD-10-CM | POA: Diagnosis not present

## 2024-04-27 DIAGNOSIS — G8324 Monoplegia of upper limb affecting left nondominant side: Secondary | ICD-10-CM | POA: Diagnosis present

## 2024-04-27 DIAGNOSIS — Z87891 Personal history of nicotine dependence: Secondary | ICD-10-CM | POA: Diagnosis not present

## 2024-04-27 DIAGNOSIS — I69954 Hemiplegia and hemiparesis following unspecified cerebrovascular disease affecting left non-dominant side: Secondary | ICD-10-CM | POA: Diagnosis not present

## 2024-04-27 DIAGNOSIS — I350 Nonrheumatic aortic (valve) stenosis: Secondary | ICD-10-CM | POA: Diagnosis not present

## 2024-04-27 LAB — COMPREHENSIVE METABOLIC PANEL WITH GFR
ALT: 30 U/L (ref 0–44)
AST: 25 U/L (ref 15–41)
Albumin: 3.9 g/dL (ref 3.5–5.0)
Alkaline Phosphatase: 81 U/L (ref 38–126)
Anion gap: 9 (ref 5–15)
BUN: 20 mg/dL (ref 8–23)
CO2: 24 mmol/L (ref 22–32)
Calcium: 9.2 mg/dL (ref 8.9–10.3)
Chloride: 109 mmol/L (ref 98–111)
Creatinine, Ser: 0.96 mg/dL (ref 0.61–1.24)
GFR, Estimated: >60 mL/min
Glucose, Bld: 112 mg/dL — ABNORMAL HIGH (ref 70–99)
Potassium: 3.8 mmol/L (ref 3.5–5.1)
Sodium: 142 mmol/L (ref 135–145)
Total Bilirubin: 1 mg/dL (ref 0.0–1.2)
Total Protein: 6.4 g/dL — ABNORMAL LOW (ref 6.5–8.1)

## 2024-04-27 LAB — ECHOCARDIOGRAM COMPLETE
Area-P 1/2: 3.6 cm2
Calc EF: 61.3 %
Height: 69 in
S' Lateral: 3.5 cm
Single Plane A2C EF: 61.2 %
Single Plane A4C EF: 61.6 %
Weight: 3040.58 [oz_av]

## 2024-04-27 LAB — DIFFERENTIAL
Abs Immature Granulocytes: 0.02 K/uL (ref 0.00–0.07)
Basophils Absolute: 0.1 K/uL (ref 0.0–0.1)
Basophils Relative: 1 %
Eosinophils Absolute: 0.3 K/uL (ref 0.0–0.5)
Eosinophils Relative: 4 %
Immature Granulocytes: 0 %
Lymphocytes Relative: 29 %
Lymphs Abs: 1.7 K/uL (ref 0.7–4.0)
Monocytes Absolute: 0.4 K/uL (ref 0.1–1.0)
Monocytes Relative: 7 %
Neutro Abs: 3.5 K/uL (ref 1.7–7.7)
Neutrophils Relative %: 59 %

## 2024-04-27 LAB — URINE DRUG SCREEN
Amphetamines: NEGATIVE
Barbiturates: NEGATIVE
Benzodiazepines: NEGATIVE
Cocaine: NEGATIVE
Fentanyl: NEGATIVE
Methadone Scn, Ur: NEGATIVE
Opiates: NEGATIVE
Tetrahydrocannabinol: NEGATIVE

## 2024-04-27 LAB — URINALYSIS, ROUTINE W REFLEX MICROSCOPIC
Bacteria, UA: NONE SEEN
Bilirubin Urine: NEGATIVE
Glucose, UA: NEGATIVE mg/dL
Ketones, ur: NEGATIVE mg/dL
Leukocytes,Ua: NEGATIVE
Nitrite: NEGATIVE
Protein, ur: NEGATIVE mg/dL
RBC / HPF: >50 RBC/hpf (ref 0–5)
Specific Gravity, Urine: 1.008 (ref 1.005–1.030)
pH: 7 (ref 5.0–8.0)

## 2024-04-27 LAB — HEPATIC FUNCTION PANEL
ALT: 30 U/L (ref 0–44)
AST: 26 U/L (ref 15–41)
Albumin: 3.9 g/dL (ref 3.5–5.0)
Alkaline Phosphatase: 80 U/L (ref 38–126)
Bilirubin, Direct: 0.3 mg/dL — ABNORMAL HIGH (ref 0.0–0.2)
Indirect Bilirubin: 0.6 mg/dL (ref 0.3–0.9)
Total Bilirubin: 1 mg/dL (ref 0.0–1.2)
Total Protein: 6.4 g/dL — ABNORMAL LOW (ref 6.5–8.1)

## 2024-04-27 LAB — LIPID PANEL
Cholesterol: 150 mg/dL (ref 0–200)
HDL: 47 mg/dL
LDL Cholesterol: 90 mg/dL (ref 0–99)
Total CHOL/HDL Ratio: 3.2 ratio
Triglycerides: 65 mg/dL
VLDL: 13 mg/dL (ref 0–40)

## 2024-04-27 LAB — CBC
HCT: 42.3 % (ref 39.0–52.0)
Hemoglobin: 14.3 g/dL (ref 13.0–17.0)
MCH: 32.7 pg (ref 26.0–34.0)
MCHC: 33.8 g/dL (ref 30.0–36.0)
MCV: 96.8 fL (ref 80.0–100.0)
Platelets: 154 K/uL (ref 150–400)
RBC: 4.37 MIL/uL (ref 4.22–5.81)
RDW: 13.8 % (ref 11.5–15.5)
WBC: 6 K/uL (ref 4.0–10.5)
nRBC: 0 % (ref 0.0–0.2)

## 2024-04-27 LAB — I-STAT CHEM 8, ED
BUN: 23 mg/dL (ref 8–23)
Calcium, Ion: 1.16 mmol/L (ref 1.15–1.40)
Chloride: 107 mmol/L (ref 98–111)
Creatinine, Ser: 1 mg/dL (ref 0.61–1.24)
Glucose, Bld: 112 mg/dL — ABNORMAL HIGH (ref 70–99)
HCT: 42 % (ref 39.0–52.0)
Hemoglobin: 14.3 g/dL (ref 13.0–17.0)
Potassium: 3.8 mmol/L (ref 3.5–5.1)
Sodium: 144 mmol/L (ref 135–145)
TCO2: 24 mmol/L (ref 22–32)

## 2024-04-27 LAB — APTT: aPTT: 31 s (ref 24–36)

## 2024-04-27 LAB — HEMOGLOBIN A1C
Hgb A1c MFr Bld: 5.8 % — ABNORMAL HIGH (ref 4.8–5.6)
Mean Plasma Glucose: 119.76 mg/dL

## 2024-04-27 LAB — PROTIME-INR
INR: 1 (ref 0.8–1.2)
Prothrombin Time: 13.6 s (ref 11.4–15.2)

## 2024-04-27 LAB — CBG MONITORING, ED: Glucose-Capillary: 104 mg/dL — ABNORMAL HIGH (ref 70–99)

## 2024-04-27 LAB — TROPONIN T, HIGH SENSITIVITY: Troponin T High Sensitivity: 20 ng/L — ABNORMAL HIGH (ref 0–19)

## 2024-04-27 LAB — ETHANOL: Alcohol, Ethyl (B): <15 mg/dL

## 2024-04-27 MED ORDER — SENNOSIDES-DOCUSATE SODIUM 8.6-50 MG PO TABS
1.0000 | ORAL_TABLET | Freq: Every evening | ORAL | Status: DC | PRN
  Filled 2024-04-27: qty 1

## 2024-04-27 MED ORDER — HEPARIN SODIUM (PORCINE) 5000 UNIT/ML IJ SOLN
5000.0000 [IU] | Freq: Three times a day (TID) | INTRAMUSCULAR | Status: DC
  Administered 2024-04-27 – 2024-05-02 (×14): 5000 [IU] via SUBCUTANEOUS
  Filled 2024-04-27 (×15): qty 1

## 2024-04-27 MED ORDER — ACETAMINOPHEN 160 MG/5ML PO SOLN: 650.0000 mg | ORAL | Status: DC | PRN

## 2024-04-27 MED ORDER — HEPARIN SODIUM (PORCINE) 5000 UNIT/ML IJ SOLN: 5000.0000 [IU] | Freq: Three times a day (TID) | INTRAMUSCULAR | Status: DC

## 2024-04-27 MED ORDER — STROKE: EARLY STAGES OF RECOVERY BOOK
Freq: Once | Status: AC
  Filled 2024-04-27: qty 1

## 2024-04-27 MED ORDER — ATORVASTATIN CALCIUM 40 MG PO TABS
40.0000 mg | ORAL_TABLET | Freq: Every day | ORAL | Status: DC
  Administered 2024-04-27 – 2024-05-02 (×6): 40 mg via ORAL
  Filled 2024-04-27 (×6): qty 1

## 2024-04-27 MED ORDER — SODIUM CHLORIDE 0.9% FLUSH
3.0000 mL | Freq: Once | INTRAVENOUS | Status: AC
  Administered 2024-04-27: 3 mL via INTRAVENOUS

## 2024-04-27 MED ORDER — CLOPIDOGREL BISULFATE 300 MG PO TABS
300.0000 mg | ORAL_TABLET | Freq: Once | ORAL | Status: AC
  Administered 2024-04-27: 300 mg via ORAL
  Filled 2024-04-27: qty 1

## 2024-04-27 MED ORDER — IOHEXOL 350 MG/ML SOLN
100.0000 mL | Freq: Once | INTRAVENOUS | Status: AC | PRN
  Administered 2024-04-27: 100 mL via INTRAVENOUS

## 2024-04-27 MED ORDER — ACETAMINOPHEN 650 MG RE SUPP: 650.0000 mg | RECTAL | Status: DC | PRN

## 2024-04-27 MED ORDER — CLOPIDOGREL BISULFATE 75 MG PO TABS
75.0000 mg | ORAL_TABLET | Freq: Every day | ORAL | Status: DC
  Administered 2024-04-28 – 2024-05-02 (×5): 75 mg via ORAL
  Filled 2024-04-27 (×5): qty 1

## 2024-04-27 MED ORDER — HEPARIN SODIUM (PORCINE) 5000 UNIT/ML IJ SOLN
5000.0000 [IU] | Freq: Three times a day (TID) | INTRAMUSCULAR | Status: DC
  Filled 2024-04-27: qty 1

## 2024-04-27 MED ORDER — ACETAMINOPHEN 325 MG PO TABS: 650.0000 mg | ORAL_TABLET | ORAL | Status: DC | PRN

## 2024-04-27 MED ORDER — ASPIRIN 81 MG PO TBEC
81.0000 mg | DELAYED_RELEASE_TABLET | Freq: Every day | ORAL | Status: DC
  Administered 2024-04-27 – 2024-05-02 (×6): 81 mg via ORAL
  Filled 2024-04-27 (×6): qty 1

## 2024-04-27 NOTE — ED Notes (Signed)
 PT at bedside.

## 2024-04-27 NOTE — ED Provider Notes (Signed)
 " [place] EMERGENCY DEPARTMENT AT [facility] Provider Note   CSN: [id number] Arrival date & time: 04/27/24  9071  An emergency department physician performed an initial assessment on this suspected stroke patient at 0934.  Patient presents with: Code Stroke   Robert Everett is a 89 y.o. male.   89 year old male presented as a code stroke.  Last known normal 2200, left-sided facial droop left arm weakness.  See ED course for further HPI        Prior to Admission medications  Medication Sig Start Date End Date Taking? Authorizing Provider  aspirin  EC 81 MG tablet Take 81 mg by mouth at bedtime.    Yes [provider]  atorvastatin  (LIPITOR) 20 MG tablet Take 20 mg by mouth at bedtime.   Yes [provider]  diclofenac (VOLTAREN) 50 MG EC tablet Take 50 mg by mouth 2 (two) times daily. 03/31/24  Yes [provider]  Multiple Vitamin (MULTIVITAMIN PO) Take 1 tablet by mouth daily.   Yes [provider]  Saw Palmetto 450 MG CAPS Take 450 mg by mouth 2 (two) times daily.   Yes [provider]    Allergies: Patient has no known allergies.    Review of Systems  Updated Vital Signs BP (!) 174/72 (BP Location: Right Arm)   Pulse (!) 58   Temp 98 F (36.7 C) (Oral)   Resp 10   Ht 5' 9 (1.753 m)   Wt 86.2 kg   SpO2 98%   BMI 28.06 kg/m   Physical Exam Vitals and nursing note reviewed.  Constitutional:      General: He is not in acute distress. HENT:     Head: Normocephalic.     Nose: Nose normal.     Mouth/Throat:     Mouth: Mucous membranes are moist.  Eyes:     Conjunctiva/sclera: Conjunctivae normal.  Cardiovascular:     Rate and Rhythm: Normal rate.  Pulmonary:     Effort: Pulmonary effort is normal.  Abdominal:     General: There is no distension.  Skin:    General: Skin is warm.     Capillary Refill: Capillary refill takes less than 2 seconds.  Neurological:     Mental Status: He is alert.      Comments: Weakness to left upper extremity.  Normal sensation.  Minor facial droop appreciated     (all labs ordered are listed, but only abnormal results are displayed) Labs Reviewed  COMPREHENSIVE METABOLIC PANEL WITH GFR - Abnormal; Notable for the following components:      Result Value   Glucose, Bld 112 (*)    Total Protein 6.4 (*)    All other components within normal limits  URINALYSIS, ROUTINE W REFLEX MICROSCOPIC - Abnormal; Notable for the following components:   Color, Urine STRAW (*)    Hgb urine dipstick MODERATE (*)    All other components within normal limits  HEMOGLOBIN A1C - Abnormal; Notable for the following components:   Hgb A1c MFr Bld 5.8 (*)    All other components within normal limits  I-STAT CHEM 8, ED - Abnormal; Notable for the following components:   Glucose, Bld 112 (*)    All other components within normal limits  CBG MONITORING, ED - Abnormal; Notable for the following components:   Glucose-Capillary 104 (*)    All other components within normal limits  PROTIME-INR  APTT  CBC  DIFFERENTIAL  ETHANOL  URINE DRUG SCREEN  EKG: EKG Interpretation Date/Time:  Thursday April 27 2024 09:57:15 EST Ventricular Rate:  81 PR Interval:  179 QRS Duration:  97 QT Interval:  512 QTC Calculation: 595 R Axis:   -1  Text Interpretation: Sinus or ectopic atrial rhythm Minimal ST depression, lateral leads Prolonged QT interval Confirmed by Neysa Clap [phone]) on 04/27/2024 10:02:43 AM  Radiology: CT ANGIO HEAD NECK W WO CM W PERF (CODE STROKE) Addendum Date: 04/27/2024 ADDENDUM REPORT: 04/27/2024 10:40 ADDENDUM: A perfusion study was also obtained. The study is normal. No perfusion deficit identified. Electronically Signed   By: Nancyann Burns M.D.   On: 04/27/2024 10:40   Result Date: 04/27/2024 CLINICAL DATA:  Left arm weakness EXAM: CT ANGIOGRAPHY HEAD AND NECK WITH CONTRAST TECHNIQUE: Multidetector CT imaging of the head and neck was performed using the  standard protocol during bolus administration of intravenous contrast. Multiplanar CT image reconstructions and MIPs were obtained to evaluate the vascular anatomy. Carotid stenosis measurements (when applicable) are obtained utilizing NASCET criteria, using the distal internal carotid diameter as the denominator. RADIATION DOSE REDUCTION: This exam was performed according to the departmental dose-optimization program which includes automated exposure control, adjustment of the mA and/or kV according to patient size and/or use of iterative reconstruction technique. CONTRAST:  OMNIPAQUE  IOHEXOL  350 MG/ML SOLN COMPARISON:  None Available. CTA NECK: CTA NECK Aortic arch: No proximal vessel stenosis. Right carotid: Normal Left carotid: Normal Right vertebral: Normal Left vertebral: Normal Soft tissues: No significant abnormality Other comments: None CTA HEAD: CTA HEAD Right anterior circulation: The internal carotid artery is patent without significant stenosis. The anterior and middle cerebral arteries are patent without significant stenosis or proximal branch occlusion. No aneurysm. Left anterior circulation: The internal carotid artery is patent without significant stenosis. The anterior and middle cerebral arteries are patent without significant stenosis or proximal branch occlusion. No aneurysm. Posterior circulation: Both vertebral arteries are patent. There is no significant basilar stenosis. Both posterior cerebral arteries are patent without significant stenosis or proximal branch occlusion. No aneurysm. IMPRESSION: No carotid stenosis on either side No significant vertebrobasilar or intracranial disease Electronically Signed: By: Nancyann Burns M.D. On: 04/27/2024 09:57   CT HEAD CODE STROKE WO CONTRAST Result Date: 04/27/2024 EXAM: CT HEAD WITHOUT CONTRAST 04/27/2024 09:37:00 AM TECHNIQUE: CT of the head was performed without the administration of intravenous contrast. Automated exposure control,  iterative reconstruction, and/or weight based adjustment of the mA/kV was utilized to reduce the radiation dose to as low as reasonably achievable. COMPARISON: Brain MRI 01/06/2006, Head CT 10/20/2005. CLINICAL HISTORY: 89 year old male with acute neurological deficit, stroke suspected. FINDINGS: BRAIN AND VENTRICLES: No acute hemorrhage. No evidence of acute infarct. No hydrocephalus. No extra-axial collection. No mass effect or midline shift. Chronic encephalomalacia anterior left superior frontal gyrus, new since 2007. Patchy and confluent but more age indeterminate hypodensity in the inferior right parietal lobe (coronal image 65). Small but circumscribed chronic appearing cerebellar infarcts. Mild for age patchy cerebral white matter hypodensity otherwise. No suspicious intracranial vascular hyperdensity. Calcified atherosclerosis at the skull base. ORBITS: Chronic left orbital floor fracture. SINUSES: Paranasal sinuses, tympanic cavities, and mastoids are well aerated. SOFT TISSUES AND SKULL: No acute soft tissue abnormality. No skull fracture. ALBERTA STROKE PROGRAM EARLY CT SCORE (ASPECTS): Ganglionic (caudate, IC, lentiform nucleus, insula, M1-M3): 7. Supraganglionic (M4-M6): 3. Total: 10. IMPRESSION: 1. Age indeterminate right PCA territory infarct. Otherwise, chronic findings (including left MCA and bilateral cerebellar artery territories). ASPECTS 10. 2. These results were communicated to Dr. KANDICE.  Palikh at 0943 hours on 04/27/2024 by text page via the [facility] messaging system. Electronically signed by: Helayne Hurst MD MD 04/27/2024 09:45 AM EST RP Workstation: [id number]     Procedures   Medications Ordered in the ED   stroke: early stages of recovery book (has no administration in time range)  aspirin  EC tablet 81 mg (81 mg Oral Given 04/27/24 1028)  clopidogrel  (PLAVIX ) tablet 75 mg (has no administration in time range)  sodium chloride  flush (NS) 0.9 % injection 3 mL (3 mLs Intravenous Given  04/27/24 1000)  iohexol  (OMNIPAQUE ) 350 MG/ML injection 100 mL (100 mLs Intravenous Contrast Given 04/27/24 0947)  clopidogrel  (PLAVIX ) tablet 300 mg (300 mg Oral Given 04/27/24 1028)    Clinical Course as of 04/27/24 1135  Thu Apr 27, 2024  0936 Presented as a code stroke.  Met at the CT scanner.  Last known normal yesterday afternoon around 4 PM.  At some point yesterday evening he developed some left upper extremity weakness.  Maintaining his airway.  EMS reported elevated blood pressure 160/100.  Appears to have left-sided facial droop, weakness to left upper extremity and lower extremities compared to the other.  Normal sensation.  Neurology recommending CTA and MRI [TY]    Clinical Course User Index [TY] Neysa Caron PARAS, DO                                 Medical Decision Making This is an 89 year old male history of DVT, prior stroke/TIAs, and PE presented to the emergency department for strokelike symptoms.  Outside the window for TNK.  No obvious intracranial pathology on the CT scan per my independent review of images in the CT scanner room.  CT head and CTA negative.  No significant metabolic derangements.  No anemia.  No leukocytosis.  UA without evidence of UTI.  EKG without ischemic changes.  Per neurology note, recommending admission for stroke workup.  Based on MRI currently.  Amount and/or Complexity of Data Reviewed Independent Historian: EMS    Details: Reported last known normal 2200.  Stable vitals and route. External Data Reviewed:     Details: Does not appear to be on blood thinner. Labs: ordered. Decision-making details documented in ED Course. Radiology: ordered and independent interpretation performed. ECG/medicine tests: independent interpretation performed. Discussion of management or test interpretation with external provider(s): Discussed with neurology  Risk Decision regarding hospitalization.      Final diagnoses:  Stroke-like symptoms    ED Discharge  Orders     None          Neysa Caron PARAS, DO 04/27/24 1135  "

## 2024-04-27 NOTE — ED Notes (Signed)
 Patient returned from MRI.

## 2024-04-27 NOTE — ED Notes (Signed)
 Placed patient on CCMD in room 46

## 2024-04-27 NOTE — Code Documentation (Signed)
 Stroke Response Nurse Documentation Code Documentation  Robert Everett is a 89 y.o. male arriving to [facility]  via [facility] on 04/27/2024 with past medical hx significant of DVT and PE in 2007 and TIA in 1990s.. On aspirin  81 mg daily. Code stroke was activated by EMS.   Patient from home where he was LKW at yesterday around 1600. Reports legs not working. Wife noted this morning he had a left facial droop and left arm flaccid.   Stroke team at the bedside on patient arrival. Labs drawn and patient cleared for CT by Dr. Neysa. Patient to CT with team. NIHSS 6, see documentation for details and code stroke times. Patient with left facial droop, left arm weakness, and left leg weakness on exam. The following imaging was completed:  CT Head and CTA. Patient is not a candidate for IV Thrombolytic due to out of window. Patient is not a candidate for IR due to no LVO.   Care Plan: Q2 VS and NIHSS; stroke workup .   Process Delays Noted: none  Bedside handoff with ED RN Chiquita.    Madelin Manila Stroke Response RN

## 2024-04-27 NOTE — Progress Notes (Signed)
" °  Echocardiogram 2D Echocardiogram has been attempted, pt in MRI. Will attempt when he arrives back  Tinnie FORBES Gosling RDCS 04/27/2024, 11:53 AM "

## 2024-04-27 NOTE — ED Triage Notes (Signed)
 BIB EMS from home LKW 2200 last night and found to have L sided facial droop L arm flaccid and BP 160/100  EMS placed 18g LAC  20g RPFA

## 2024-04-27 NOTE — Progress Notes (Signed)
  Inpatient Rehab Admissions Coordinator :  Per therapy recommendations, patient was screened for CIR candidacy by Ottie Glazier RN MSN.  At this time patient appears to be a potential candidate for CIR. I will place a rehab consult per protocol for full assessment. Please call me with any questions.  Ottie Glazier RN MSN Admissions Coordinator [phone]

## 2024-04-27 NOTE — Evaluation (Signed)
 Physical Therapy Evaluation Patient Details Name: Robert Everett MRN: [id number] DOB: [birth year] Today's Date: 04/27/2024  History of Present Illness  Pt is 89 yo presenting [facility] on 1/8 due to L arm/leg weakness, L facial droop. MRI performed; neurologist note not yet in. PMH: TIA, DVT, BCC, R shoulder arthritis  Clinical Impression  Currently pt is Min A for bed mobility and sit to stand from elevated surface with HHA. Pt was unable to fully WB through the LLE and demonstrates mild dysdiadochokinesia of the LLE with toe taps/heel shin slide. Pt lives at home with elderly spouse who is able to assist 24/7 with light physical assist. Due to pt current functional status, home set up and available assistance at home recommending skilled physical therapy services > 3 hours/day in order to address strength, balance and functional mobility to decrease risk for falls, injury, immobility, skin break down and re-hospitalization.          If plan is discharge home, recommend the following: Help with stairs or ramp for entrance;Assist for transportation;Assistance with Academic Librarian cushion (measurements PT);Wheelchair (measurements PT)  Recommendations for Other Services  Rehab consult    Functional Status Assessment Patient has had a recent decline in their functional status and demonstrates the ability to make significant improvements in function in a reasonable and predictable amount of time.     Precautions / Restrictions Precautions Precautions: Fall Recall of Precautions/Restrictions: Intact Restrictions Weight Bearing Restrictions Per Provider Order: No      Mobility  Bed Mobility Overal bed mobility: Needs Assistance Bed Mobility: Supine to Sit, Sit to Supine     Supine to sit: Min assist Sit to supine: Min assist   General bed mobility comments: Min A for trunk to midline and Min A for LE to EOB.    Transfers Overall transfer  level: Needs assistance Equipment used: 1 person hand held assist Transfers: Sit to/from Stand Sit to Stand: Min assist, From elevated surface           General transfer comment: Min A for sit to stand from elevated stretcher verbal cues for safety.    Ambulation/Gait     General Gait Details: Pt attempted to march in place but was unable to clear the RLE from the floor without LLE buckling.     Balance Overall balance assessment: Needs assistance Sitting-balance support: Single extremity supported, Feet unsupported Sitting balance-Leahy Scale: Good     Standing balance support: During functional activity, Single extremity supported Standing balance-Leahy Scale: Poor Standing balance comment: static standing is fair; able to stand and urinate at CGA into urinal. Very poor dynamic standing balance.       Pertinent Vitals/Pain Pain Assessment Pain Assessment: No/denies pain    Home Living Family/patient expects to be discharged to:: Private residence Living Arrangements: Spouse/significant other Available Help at Discharge: Family;Available 24 hours/day Type of Home: House Home Access: Ramped entrance       Home Layout: One level Home Equipment: Grab bars - tub/shower;Grab bars - toilet;Shower seat;Other (comment);Cane - single point Additional Comments: uses urinal at night    Prior Function Prior Level of Function : Independent/Modified Independent;Driving             Mobility Comments: normally ind; was using [facility] after back surgery in [place] but had gotten away from it. ADLs Comments: INd with ADLs/IADLs.     Extremity/Trunk Assessment   Upper Extremity Assessment Upper Extremity Assessment: Defer to OT evaluation  Lower Extremity Assessment Lower Extremity Assessment: LLE deficits/detail LLE Deficits / Details: 4-/5 strength LLE Sensation: decreased proprioception LLE Coordination: decreased gross motor    Cervical / Trunk  Assessment Cervical / Trunk Assessment: Normal  Communication   Communication Communication: Impaired Factors Affecting Communication: Hearing impaired    Cognition Arousal: Alert Behavior During Therapy: WFL for tasks assessed/performed   PT - Cognitive impairments: No apparent impairments       Cueing Cueing Techniques: Verbal cues, Visual cues     General Comments General comments (skin integrity, edema, etc.): HR down to 49 bpm during session. BP O2 sats WNL        Assessment/Plan    PT Assessment Patient needs continued PT services  PT Problem List Decreased strength;Decreased balance;Decreased activity tolerance;Decreased mobility;Decreased coordination       PT Treatment Interventions DME instruction;Therapeutic activities;Gait training;Therapeutic exercise;Patient/family education;Balance training;Functional mobility training;Neuromuscular re-education    PT Goals (Current goals can be found in the Care Plan section)  Acute Rehab PT Goals Patient Stated Goal: improve mobility and return to PLOF PT Goal Formulation: With patient/family Time For Goal Achievement: 05/11/24 Potential to Achieve Goals: Good    Frequency Min 3X/week        AM-PAC PT 6 Clicks Mobility  Outcome Measure Help needed turning from your back to your side while in a flat bed without using bedrails?: A Little Help needed moving from lying on your back to sitting on the side of a flat bed without using bedrails?: A Little Help needed moving to and from a bed to a chair (including a wheelchair)?: A Lot Help needed standing up from a chair using your arms (e.g., wheelchair or bedside chair)?: A Little Help needed to walk in hospital room?: Total Help needed climbing 3-5 steps with a railing? : Total 6 Click Score: 13    End of Session Equipment Utilized During Treatment: Gait belt Activity Tolerance: Patient tolerated treatment well Patient left: in bed;with call bell/phone within  reach;with family/visitor present Nurse Communication: Mobility status PT Visit Diagnosis: Unsteadiness on feet (R26.81);Other abnormalities of gait and mobility (R26.89);Hemiplegia and hemiparesis Hemiplegia - Right/Left: Left Hemiplegia - dominant/non-dominant: Non-dominant Hemiplegia - caused by: Cerebral infarction    Time: 1450-1517 PT Time Calculation (min) (ACUTE ONLY): 27 min   Charges:   PT Evaluation $PT Eval Low Complexity: 1 Low PT Treatments $Therapeutic Activity: 8-22 mins PT General Charges $$ ACUTE PT VISIT: 1 Visit        Dorothyann Maier, DPT, CLT  Acute Rehabilitation Services Office: [phone] (Secure chat preferred)   Dorothyann VEAR Maier 04/27/2024, 4:09 PM

## 2024-04-27 NOTE — H&P (Signed)
 " History and Physical    Patient: Robert Everett FMW:[id number] DOB: Aug 17, 1933 DOA: 04/27/2024 DOS: the patient was seen and examined on 04/27/2024 . PCP: Merilee, L.Addie, MD (Inactive)  Patient coming from: Home Chief complaint: Chief Complaint  Patient presents with   Code Stroke  HPI:  Robert Everett is a 89 y.o. male with past medical history  of TIA, DVT, right shoulder surgeries, low back pain hyperlipidemia, Brought to the emergency room with left-sided weakness wife at bedside states that patient was normal yesterday and this morning when she was trying to get him out of bed noted that he could not move his left upper extremity.  Patient has no history of stroke.In the emergency room code stroke activated seen by neurology stat head CT dual antiplatelet therapy initiated.  At bedside patient is alert awake oriented, left facial droop noted, left upper remedy weakness.  ED Course:  Vital signs in the ED were notable for the following:  Vitals:   04/27/24 1339 04/27/24 1500 04/27/24 1534 04/27/24 1600  BP:  (!) 158/83 (!) 158/83 (!) 164/52  Pulse:  (!) 58 97 (!) 56  Temp: 98.2 F (36.8 C)  98.2 F (36.8 C)   Resp:  15 20 (!) 24  Height:      Weight:      SpO2:  99% 98% 97%  TempSrc: Oral  Oral   BMI (Calculated):       >>ED evaluation thus far shows: EKG shows sinus rhythm 81 PR 179 QTc 595, st depression in inferior lead. Normal CBC. CMP showing glucose of 112 A1c 5.8 normal electrolytes normal kidney function, direct bili 0.3 normal LFTs. Lipid panel added on as shows total cholesterol of 158 LDL of 90 HDL 47 triglyceride of 65. Urinalysis shows moderate hemoglobin with >50 RBCs.   >>While in the ED patient received the following: Medications   stroke: early stages of recovery book (has no administration in time range)  aspirin  EC tablet 81 mg (81 mg Oral Given 04/27/24 1028)  clopidogrel  (PLAVIX ) tablet 75 mg (has no administration in time range)  sodium chloride   flush (NS) 0.9 % injection 3 mL (3 mLs Intravenous Given 04/27/24 1000)  iohexol  (OMNIPAQUE ) 350 MG/ML injection 100 mL (100 mLs Intravenous Contrast Given 04/27/24 0947)  clopidogrel  (PLAVIX ) tablet 300 mg (300 mg Oral Given 04/27/24 1028)   Review of Systems  Neurological:  Positive for weakness.  All other systems reviewed and are negative.  Past Medical History:  Diagnosis Date   Arthritis    hands & shoulder    Cancer (HCC)    basal cell- nose   Diverticulosis    DVT (deep venous thrombosis) (HCC) 2007   treated with coumadin- x1    H/O exercise stress test 2000?   done following what they thought was an TIA, told all was wnll   Prostate enlargement    takes saw palmetto    Pulmonary embolism (HCC) 2007   Seasonal allergies    uses Flonase nasal spray   Stroke [facility])    TIA- 1990's   Past Surgical History:  Procedure Laterality Date   INSERTION OF MESH Bilateral 06/28/2014   Procedure: INSERTION OF MESH;  Surgeon: Vicenta Poli, MD;  Location: [facility] OR;  Service: General;  Laterality: Bilateral;   LAPAROSCOPIC INGUINAL HERNIA WITH UMBILICAL HERNIA Bilateral 06/28/2014   Procedure: LAPAROSCOPIC RIGHT AND LEFT  INGUINAL HERNIA WITH UMBILICAL HERNIA  AND MESH;  Surgeon: Vicenta Poli, MD;  Location: [facility] OR;  Service: General;  Laterality: Bilateral;   polyps removed   2010   on colonoscopy   SHOULDER SURGERY Right    rotator cuff repair , x2 on the same shoulder   TONSILLECTOMY      reports that he quit smoking about 53 years ago. His smoking use included cigarettes. He has never used smokeless tobacco. He reports that he does not drink alcohol and does not use drugs. Allergies[1] History reviewed. No pertinent family history. Prior to Admission medications  Medication Sig Start Date End Date Taking? Authorizing Provider  aspirin  EC 81 MG tablet Take 81 mg by mouth at bedtime.    Yes [provider]  atorvastatin  (LIPITOR) 20 MG tablet Take 20 mg by mouth at bedtime.    Yes [provider]  diclofenac (VOLTAREN) 50 MG EC tablet Take 50 mg by mouth 2 (two) times daily. 03/31/24  Yes [provider]  Multiple Vitamin (MULTIVITAMIN PO) Take 1 tablet by mouth daily.   Yes [provider]  Saw Palmetto 450 MG CAPS Take 450 mg by mouth 2 (two) times daily.   Yes [provider]                                                                                 Vitals:   04/27/24 1339 04/27/24 1500 04/27/24 1534 04/27/24 1600  BP:  (!) 158/83 (!) 158/83 (!) 164/52  Pulse:  (!) 58 97 (!) 56  Resp:  15 20 (!) 24  Temp: 98.2 F (36.8 C)  98.2 F (36.8 C)   TempSrc: Oral  Oral   SpO2:  99% 98% 97%  Weight:      Height:       Physical Exam Vitals and nursing note reviewed.  Constitutional:      General: He is not in acute distress.    Appearance: He is not ill-appearing.  HENT:     Head: Normocephalic and atraumatic.     Right Ear: Hearing normal.     Left Ear: Hearing normal.     Nose: No nasal deformity.     Mouth/Throat:     Lips: Pink.  Eyes:     General: Lids are normal.     Extraocular Movements: Extraocular movements intact.     Pupils: Pupils are equal, round, and reactive to light.  Cardiovascular:     Rate and Rhythm: Normal rate and regular rhythm.     Pulses: Normal pulses.     Heart sounds: Normal heart sounds.  Pulmonary:     Effort: Pulmonary effort is normal.     Breath sounds: Normal breath sounds.  Abdominal:     General: Bowel sounds are normal. There is no distension.     Palpations: Abdomen is soft. There is no mass.     Tenderness: There is no abdominal tenderness.  Musculoskeletal:     Right lower leg: No edema.     Left lower leg: No edema.  Skin:    General: Skin is warm.  Neurological:     General: No focal deficit present.     Mental Status: He is alert and oriented to person, place, and time.  Cranial Nerves: Facial asymmetry present. No dysarthria.     Motor: Weakness  present.     Deep Tendon Reflexes:     Reflex Scores:      Bicep reflexes are 2+ on the right side and 2+ on the left side.      Patellar reflexes are 2+ on the right side and 2+ on the left side. Psychiatric:        Attention and Perception: Attention normal.        Mood and Affect: Mood normal.        Speech: Speech normal.        Behavior: Behavior is cooperative.     Labs on Admission: I have personally reviewed following labs and imaging studies CBC: Recent Labs  Lab 04/27/24 0930 04/27/24 0938  WBC 6.0  --   NEUTROABS 3.5  --   HGB 14.3 14.3  HCT 42.3 42.0  MCV 96.8  --   PLT 154  --    Basic Metabolic Panel: Recent Labs  Lab 04/27/24 0930 04/27/24 0938  NA 142 144  K 3.8 3.8  CL 109 107  CO2 24  --   GLUCOSE 112* 112*  BUN 20 23  CREATININE 0.96 1.00  CALCIUM  9.2  --    GFR: Estimated Creatinine Clearance: 55.5 mL/min (by C-G formula based on SCr of 1 mg/dL). Liver Function Tests: Recent Labs  Lab 04/27/24 0930  AST 26  25  ALT 30  30  ALKPHOS 80  81  BILITOT 1.0  1.0  PROT 6.4*  6.4*  ALBUMIN 3.9  3.9   No results for input(s): LIPASE, AMYLASE in the last 168 hours. No results for input(s): AMMONIA in the last 168 hours. Recent Labs    04/27/24 0930 04/27/24 0938  BUN 20 23  CREATININE 0.96 1.00    Cardiac Enzymes: No results for input(s): CKTOTAL, CKMB, CKMBINDEX, TROPONINI in the last 168 hours. BNP (last 3 results) No results for input(s): PROBNP in the last 8760 hours. HbA1C: Recent Labs    04/27/24 0930  HGBA1C 5.8*   CBG: Recent Labs  Lab 04/27/24 0930  GLUCAP 104*   Lipid Profile: Recent Labs    04/27/24 0930  CHOL 150  HDL 47  LDLCALC 90  TRIG 65  CHOLHDL 3.2   Thyroid Function Tests: No results for input(s): TSH, T4TOTAL, FREET4, T3FREE, THYROIDAB in the last 72 hours. Anemia Panel: No results for input(s): VITAMINB12, FOLATE, FERRITIN, TIBC, IRON, RETICCTPCT in the  last 72 hours. Urine analysis:    Component Value Date/Time   COLORURINE STRAW (A) 04/27/2024 1039   APPEARANCEUR CLEAR 04/27/2024 1039   LABSPEC 1.008 04/27/2024 1039   PHURINE 7.0 04/27/2024 1039   GLUCOSEU NEGATIVE 04/27/2024 1039   HGBUR MODERATE (A) 04/27/2024 1039   BILIRUBINUR NEGATIVE 04/27/2024 1039   KETONESUR NEGATIVE 04/27/2024 1039   PROTEINUR NEGATIVE 04/27/2024 1039   NITRITE NEGATIVE 04/27/2024 1039   LEUKOCYTESUR NEGATIVE 04/27/2024 1039   Radiological Exams on Admission: MR BRAIN WO CONTRAST Result Date: 04/27/2024 EXAM: MRI BRAIN WITHOUT CONTRAST 04/27/2024 12:09:00 PM TECHNIQUE: Multiplanar multisequence MRI of the head/brain was performed without the administration of intravenous contrast. COMPARISON: Same day CT head and CTA head and neck. CLINICAL HISTORY: Stroke, follow up. FINDINGS: BRAIN AND VENTRICLES: There is a 1.7 cm focus of restricted diffusion in the posterior aspect of the right corona radiata compatible with an acute infarct. Encephalomalacia and gliosis in the left superior frontal gyrus compatible with remote infarct. There  are multiple additional remote infarcts in the cerebellum, right more than left. Additional remote cortical infarct in the right occipital lobe. T2 and FLAIR hyperintensity in the periventricular and subcortical white matter suggestive of mild to moderate chronic microvascular ischemic changes. Mild parietal predominant age related volume loss. Chronic microhemorrhage in the left temporal lobe. No acute intracranial hemorrhage. No mass. No midline shift. No hydrocephalus. The sella is unremarkable. Normal flow voids. ORBITS: Bilateral lens replacement. SINUSES AND MASTOIDS: No significant abnormality. BONES AND SOFT TISSUES: Normal marrow signal. No significant soft tissue abnormality. IMPRESSION: 1. Acute infarct in the posterior aspect of the right corona radiata. 2. 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 3. Mild to moderate chronic microvascular ischemic changes. Electronically signed by: Donnice Mania MD MD 04/27/2024 02:13 PM EST RP Workstation: [id number]   CT ANGIO HEAD NECK W WO CM W PERF (CODE STROKE) Addendum Date: 04/27/2024 ADDENDUM REPORT: 04/27/2024 10:40 ADDENDUM: A perfusion study was also obtained. The study is normal. No perfusion deficit identified. Electronically Signed   By: Nancyann Burns M.D.   On: 04/27/2024 10:40   Result Date: 04/27/2024 CLINICAL DATA:  Left arm weakness EXAM: CT ANGIOGRAPHY HEAD AND NECK WITH CONTRAST TECHNIQUE: Multidetector CT imaging of the head and neck was performed using the standard protocol during bolus administration of intravenous contrast. Multiplanar CT image reconstructions and MIPs were obtained to evaluate the vascular anatomy. Carotid stenosis measurements (when applicable) are obtained utilizing NASCET criteria, using the distal internal carotid diameter as the denominator. RADIATION DOSE REDUCTION: This exam was performed according to the departmental dose-optimization program which includes automated exposure control, adjustment of the mA and/or kV according to patient size and/or use of iterative reconstruction technique. CONTRAST:  OMNIPAQUE  IOHEXOL  350 MG/ML SOLN COMPARISON:  None Available. CTA NECK: CTA NECK Aortic arch: No proximal vessel stenosis. Right carotid: Normal Left carotid: Normal Right vertebral: Normal Left vertebral: Normal Soft tissues: No significant abnormality Other comments: None CTA HEAD: CTA HEAD Right anterior circulation: The internal carotid artery is patent without significant stenosis. The anterior and middle cerebral arteries are patent without significant stenosis or proximal branch occlusion. No aneurysm. Left anterior circulation: The internal carotid artery is patent without significant stenosis. The anterior and middle cerebral arteries are patent without significant stenosis or  proximal branch occlusion. No aneurysm. Posterior circulation: Both vertebral arteries are patent. There is no significant basilar stenosis. Both posterior cerebral arteries are patent without significant stenosis or proximal branch occlusion. No aneurysm. IMPRESSION: No carotid stenosis on either side No significant vertebrobasilar or intracranial disease Electronically Signed: By: Nancyann Burns M.D. On: 04/27/2024 09:57   CT HEAD CODE STROKE WO CONTRAST Result Date: 04/27/2024 EXAM: CT HEAD WITHOUT CONTRAST 04/27/2024 09:37:00 AM TECHNIQUE: CT of the head was performed without the administration of intravenous contrast. Automated exposure control, iterative reconstruction, and/or weight based adjustment of the mA/kV was utilized to reduce the radiation dose to as low as reasonably achievable. COMPARISON: Brain MRI 01/06/2006, Head CT 10/20/2005. CLINICAL HISTORY: 89 year old male with acute neurological deficit, stroke suspected. FINDINGS: BRAIN AND VENTRICLES: No acute hemorrhage. No evidence of acute infarct. No hydrocephalus. No extra-axial collection. No mass effect or midline shift. Chronic encephalomalacia anterior left superior frontal gyrus, new since 2007. Patchy and confluent but more age indeterminate hypodensity in the inferior right parietal lobe (coronal image 65). Small but circumscribed chronic appearing cerebellar infarcts. Mild for age patchy cerebral white matter hypodensity otherwise. No suspicious intracranial  vascular hyperdensity. Calcified atherosclerosis at the skull base. ORBITS: Chronic left orbital floor fracture. SINUSES: Paranasal sinuses, tympanic cavities, and mastoids are well aerated. SOFT TISSUES AND SKULL: No acute soft tissue abnormality. No skull fracture. ALBERTA STROKE PROGRAM EARLY CT SCORE (ASPECTS): Ganglionic (caudate, IC, lentiform nucleus, insula, M1-M3): 7. Supraganglionic (M4-M6): 3. Total: 10. IMPRESSION: 1. Age indeterminate right PCA territory infarct. Otherwise,  chronic findings (including left MCA and bilateral cerebellar artery territories). ASPECTS 10. 2. These results were communicated to Dr. JUDITHANN Raddle at [phone] hours on 04/27/2024 by text page via the [facility] messaging system. Electronically signed by: Helayne Hurst MD MD 04/27/2024 09:45 AM EST RP Workstation: [id number]   Data Reviewed: Relevant notes from primary care and specialist visits, past discharge summaries as available in EHR, including Care Everywhere . Prior diagnostic testing as pertinent to current admission diagnoses, Updated medications and problem lists for reconciliation .ED course, including vitals, labs, imaging, treatment and response to treatment,Triage notes, nursing and pharmacy notes and ED provider's notes.Notable results as noted in HPI.Discussed case with EDMD/ ED APP/ or Specialty MD on call and as needed.  Assessment & Plan  >>LUE weakness: This patient presents with an acute ischemic infarct in the right corona radiata (1.7 cm on MRI) manifesting as left upper extremity weakness and facial droop. He was appropriately treated with dual antiplatelet therapy (aspirin  81 mg + clopidogrel  300 mg loading dose, then 75 mg daily) initiated in the ED, The patient was not a candidate for thrombolysis or mechanical thrombectomy given the time window and stroke severity. We will Continue aspirin  81 mg + clopidogrel  75 mg daily for 21-90 days, then transition to single antiplatelet therapy (clopidogrel  preferred). Echocardiogram ordered , Prolonged cardiac monitoring for occult atrial fibrillation detection, CTA head/neck shows no significant stenosis - no indication for carotid intervention. Imaging findings: MRI reveals multiple remote infarcts (left frontal, right occipital, bilateral cerebellar) and moderate chronic microvascular ischemic changes, indicating this is not his first stroke despite history stating no history of stroke (though [facility] notes TIA in 1990s).  - Admit to telemetry. -  Stroke service consulted. - Aspirin  /Plavix  daily . - Check FLP, hemoglobin A1c. - Check 2 D Echocardiogram . - RN to perform stroke swallowing screen and if patient passes, place diet order. - Neuro checks per protocol  - PT/OT evaluations  >>Hyperlipidemia: Current lipid panel shows total cholesterol 150, LDL 90, HDL 47, triglycerides 65. Patient already on atorvastatin  we will increase to 40 mg or per neuro.  >> Elevated Blood Pressure: ED vitals showed BP 158-164/52-83 mm Hg. Patient has no documented history of hypertension but clearly has elevated BP. Permissive HTN.   >>DVT Prophylaxis Patient has left-sided hemiparesis and is at high risk for venous thromboembolism. Continue subcutaneous heparin  5,000 units every 8-12 hours   DVT prophylaxis:  Heparin  q8. Consults:  Neurology.  Advance Care Planning:    Code Status: Full Code   Family Communication:  Wife at bedside. Disposition Plan:  TBD.  Severity of Illness: The appropriate patient status for this patient is INPATIENT. Inpatient status is judged to be reasonable and necessary in order to provide the required intensity of service to ensure the patient's safety. The patient's presenting symptoms, physical exam findings, and initial radiographic and laboratory data in the context of their chronic comorbidities is felt to place them at high risk for further clinical deterioration. Furthermore, it is not anticipated that the patient will be medically stable for discharge from the hospital within 2 midnights of  admission.   * I certify that at the point of admission it is my clinical judgment that the patient will require inpatient hospital care spanning beyond 2 midnights from the point of admission due to high intensity of service, high risk for further deterioration and high frequency of surveillance required.*  Unresulted Labs (From admission, onward)     Start     Ordered   04/28/24 0500  Lipid panel  (Labs)   Tomorrow morning,   R       Comments: Fasting    04/27/24 1015   04/28/24 0500  CBC with Differential/Platelet  Tomorrow morning,   R        04/27/24 1715   04/28/24 0500  Comprehensive metabolic panel with GFR  Tomorrow morning,   R        04/27/24 1715   04/28/24 0500  Magnesium   Tomorrow morning,   R        04/27/24 1715   04/28/24 0500  Phosphorus  Tomorrow morning,   R        04/27/24 1715            Meds ordered this encounter  Medications   sodium chloride  flush (NS) 0.9 % injection 3 mL   iohexol  (OMNIPAQUE ) 350 MG/ML injection 100 mL    stroke: early stages of recovery book   aspirin  EC tablet 81 mg   clopidogrel  (PLAVIX ) tablet 300 mg   clopidogrel  (PLAVIX ) tablet 75 mg   OR Linked Order Group    acetaminophen  (TYLENOL ) tablet 650 mg    acetaminophen  (TYLENOL ) 160 MG/5ML solution 650 mg    acetaminophen  (TYLENOL ) suppository 650 mg   senna-docusate (Senokot-S) tablet 1 tablet   DISCONTD: heparin  injection 5,000 Units   atorvastatin  (LIPITOR) tablet 40 mg   DISCONTD: heparin  injection 5,000 Units   heparin  injection 5,000 Units     Orders Placed This Encounter  Procedures   CT HEAD CODE STROKE WO CONTRAST   CT ANGIO HEAD NECK W WO CM W PERF (CODE STROKE)   MR BRAIN WO CONTRAST   Protime-INR   APTT   CBC   Differential   Comprehensive metabolic panel   Ethanol   Lipid panel   Urine Drug Screen   Urinalysis, Routine w reflex microscopic -Urine, Clean Catch   Hemoglobin A1c   Hepatic function panel   Lipid panel   CBC with Differential/Platelet   Comprehensive metabolic panel with GFR   Magnesium    Phosphorus   Diet Heart Room service appropriate? Yes with Assist; Fluid consistency: Thin   ED Cardiac monitoring   NIH Stroke Scale   Saline Lock IV, Maintain IV access   If O2 sat <94% Administer O2 @ 2 Liters/Minute   NIHSS score documentation NIHSS score range: 0-42   Vital signs   Notify physician (specify)   Swallow screen - If patient does  NOT pass this screen, place order for SLP eval and treat (SLP2) - swallowing evaluation (BSE, MBS and/or diet order as indicated)   RN to notify Physician for appropriate diet after patient passes swallow screen   NIH Stroke Scale   Intake and output   Cardiac Monitoring - Continuous Indefinite   Apply Stroke Care Plan: Ischemic Stroke, TIA   Initiate Adult Central Line Maintenance and Catheter Protocol for patients with central line (CVC, PICC, Port, Hemodialysis, Trialysis)   Discuss with patient and document patient's goals for stroke risk factor reduction   Initiate Oral Care Protocol   Initiate  Carrier Fluid Protocol   Provide stroke education material to patient and family   Nurse to provide smoking / tobacco cessation education   NIHSS score documentation NIHSS score range: 0-42   Vital signs   Notify physician (specify)   OOB with assistance   Activity as tolerated   Swallow screen - If patient does NOT pass this screen, place order for SLP eval and treat (SLP2) - swallowing evaluation (BSE, MBS and/or diet order as indicated)   NIH Stroke Scale   Intake and output   Apply Stroke Care Plan: Ischemic Stroke, TIA   Discuss with patient and document patient's goals for stroke risk factor reduction   Initiate Oral Care Protocol   Initiate Carrier Fluid Protocol   Provide stroke education material to patient and family.   Nurse to provide smoking / tobacco cessation education   If the patient has passed the Stroke Swallow Screen or has a feeding tube, then RN may order General Admission PRN Orders (through manage orders) for the following patient needs: allergy symptoms (Claritin), cold sores (Carmex), cough (Robitussin DM), eye irritation (Liquifilm Tears), hemorrhoids (Tucks), indigestion (Maalox), minor skin irritation (hydrocortisone cream), muscle pain Lucienne Gay), nose irritation (saline nasal spray) and sore throat (Chloraseptic spray).   Ambulate with assistance   Cardiac  Monitoring - Continuous Indefinite   Full code   [place] to Activate Code Stroke   Consult to hospitalist   Consult to Registered Dietitian   Consult to Transition of Care Team   OT eval and treat   PT eval and treat   ED Pulse oximetry, continuous   Oxygen therapy Mode or (Route): Nasal cannula; Liters Per Minute: 2; Keep O2 saturation between: greater than 94 %   Oxygen therapy Mode or (Route): Nasal cannula; Liters Per Minute: 2; Keep O2 saturation between: greater than 94 %   SLP eval and treat Reason for evaluation: Cognitive/Language evaluation   I-stat chem 8, ED   CBG monitoring, ED   EKG 12-Lead   ECHOCARDIOGRAM COMPLETE   Admit to Inpatient (patient's expected length of stay will be greater than 2 midnights or inpatient only procedure)   Fall precautions   Aspiration precautions    Author: Mario LULLA Blanch, MD 12 pm- 8 pm. [facility]. 04/27/2024 5:15 PM Please note for any communication after hours contact [facility] Assigned provider on call on Amion.       [1] No Known Allergies  "

## 2024-04-27 NOTE — Consult Note (Addendum)
 NEUROLOGY CONSULT NOTE   Date of service: April 27, 2024 Patient Name: Robert Everett MRN:  [id number] DOB:  November 12, 1933 Chief Complaint: CODE STROKE Requesting Provider: Neysa Caron PARAS, DO  History of Present Illness  Robert Everett is a 89 y.o. male with hx of TIA (1990s), DVT 2007 (treated with Coumadin x1), BCC, R Shoulder arthritis and surgeries who was BIB EMS as a CODE STROKE due to onset of left arm and leg weakness, left facial droop. Patient states he was in his usual state of health yesterday at his eye doctor appointment at 1500. He went to bed around 2200 and his arm did feel weak at that time. When he woke up this morning, his arm was weaker and his leg was feeling different.   On exam at bridge, patient alert, oriented, left facial droop, no aphasia, no dysarthria, left arm severe weakness, left leg mild weakness, no sensory deficit. CBG WNL. BP 171/55.  CTH showed age-indeterminate right PCA infarct. CTA showed no LVO, CTP showed no mismatch. He is outside the window for TNK. No LVO seen on CTA that would necessitate EVT.   LKW: 1500 1/7 Modified rankin score: 0-Completely asymptomatic and back to baseline post- stroke IV Thrombolysis: No, outside of window EVT: No , No LVO   NIHSS components Score: Comment  1a Level of Conscious 0[x]  1[]  2[]  3[]      1b LOC Questions 0[x]  1[]  2[]       1c LOC Commands 0[x]  1[]  2[]       2 Best Gaze 0[x]  1[]  2[]       3 Visual 0[x]  1[]  2[]  3[]      4 Facial Palsy 0[]  1[x]  2[]  3[]      5a Motor Arm - left 0[]  1[]  2[]  3[x]  4[]  UN[]    5b Motor Arm - Right 0[x]  1[]  2[]  3[]  4[]  UN[]    6a Motor Leg - Left 0[]  1[x]  2[]  3[]  4[]  UN[]    6b Motor Leg - Right 0[x]  1[]  2[]  3[]  4[]  UN[]    7 Limb Ataxia 0[x]  1[]  2[]  UN[]      8 Sensory 0[x]  1[]  2[]  UN[]      9 Best Language 0[x]  1[]  2[]  3[]      10 Dysarthria 0[x]  1[]  2[]  UN[]      11 Extinct. and Inattention 0[x]  1[]  2[]       TOTAL:   5      ROS  Comprehensive ROS performed and pertinent positives  documented in HPI  Past History   Past Medical History:  Diagnosis Date   Arthritis    hands & shoulder    Cancer (HCC)    basal cell- nose   Diverticulosis    DVT (deep venous thrombosis) (HCC) 2007   treated with coumadin- x1    H/O exercise stress test 2000?   done following what they thought was an TIA, told all was wnll   Prostate enlargement    takes saw palmetto    Pulmonary embolism (HCC) 2007   Seasonal allergies    uses Flonase nasal spray   Stroke [facility])    TIA- 1990's    Past Surgical History:  Procedure Laterality Date   INSERTION OF MESH Bilateral 06/28/2014   Procedure: INSERTION OF MESH;  Surgeon: Vicenta Poli, MD;  Location: [facility] OR;  Service: General;  Laterality: Bilateral;   LAPAROSCOPIC INGUINAL HERNIA WITH UMBILICAL HERNIA Bilateral 06/28/2014   Procedure: LAPAROSCOPIC RIGHT AND LEFT  INGUINAL HERNIA WITH UMBILICAL HERNIA  AND MESH;  Surgeon: Vicenta Poli, MD;  Location:  [facility] OR;  Service: General;  Laterality: Bilateral;   polyps removed   2010   on colonoscopy   SHOULDER SURGERY Right    rotator cuff repair , x2 on the same shoulder   TONSILLECTOMY      Family History: History reviewed. No pertinent family history.  Social History  reports that he quit smoking about 53 years ago. His smoking use included cigarettes. He has never used smokeless tobacco. He reports that he does not drink alcohol and does not use drugs.  Allergies[1]  Medications  Current Medications[2]  Vitals   Vitals:   05/21/2024 0934 05/21/2024 0936 05-21-2024 0950 May 21, 2024 0958  BP:  (!) 171/55 (!) 170/65 (!) 174/72  Pulse:  (!) 57 (!) 57 (!) 58  Resp:   16 10  Temp:    98 F (36.7 C)  TempSrc:    Oral  SpO2:    98%  Weight: 86.2 kg     Height: 5' 9 (1.753 m)       Body mass index is 28.06 kg/m.   Physical Exam   Constitutional: Appears well-developed and well-nourished.  Cardiovascular: Normal rate and regular rhythm. Normotensive.  Respiratory: Effort  normal, non-labored breathing.   Neurologic Examination   Neuro: Mental Status: Patient is awake, alert, oriented to person, place, month, year, and situation. Patient is able to give a clear and coherent history. No signs of aphasia or neglect Cranial Nerves: II: Visual Fields are full. Pupils are equal, round, and reactive to light.   III,IV, VI: EOMI without ptosis or diploplia.  V: Facial sensation is symmetric to light touch VII: Left facial droop  VIII: hearing is intact to voice X: Uvula elevates symmetrically. No dysarthria.  XI: Shoulder shrug is symmetric. XII: tongue is midline without atrophy or fasciculations.  Motor: Tone is normal. Bulk is normal.  LUE: Able to move fingers and wrist. Unable to lift off bed or move laterally. Patient states he can't move it at all, but able to see movement in fingers and wrist.  LLE: Mild drift, against resistance strength.  RUE/RLE: No deficits Sensory: Sensation is symmetric to light touch in the arms and legs. Cerebellar: FNF intact on right, unable to perform on left.    Labs/Imaging/Neurodiagnostic studies   CBC:  Recent Labs  Lab 2024/05/21 0930 05/21/24 0938  WBC 6.0  --   NEUTROABS 3.5  --   HGB 14.3 14.3  HCT 42.3 42.0  MCV 96.8  --   PLT 154  --    Basic Metabolic Panel:  Lab Results  Component Value Date   NA 144 2024-05-21   K 3.8 05-21-2024   CO2 25 07/05/2014   GLUCOSE 112 (H) May 21, 2024   BUN 23 05/21/24   CREATININE 1.00 05/21/24   CALCIUM  9.0 07/05/2014   GFRNONAA 63 (L) 07/05/2014   GFRAA 73 (L) 07/05/2014   Lipid Panel: No results found for: LDLCALC HgbA1c: No results found for: HGBA1C Urine Drug Screen: No results found for: LABOPIA, COCAINSCRNUR, LABBENZ, AMPHETMU, THCU, LABBARB  Alcohol Level No results found for: [facility] INR  Lab Results  Component Value Date   INR 1.0 05/21/2024   APTT  Lab Results  Component Value Date   APTT 31 05-21-24   AED levels: No  results found for: PHENYTOIN, ZONISAMIDE, LAMOTRIGINE, LEVETIRACETA  CT Head without contrast(Personally reviewed): Age indeterminate right PCA territory infarct. Otherwise, chronic findings (including left MCA and bilateral cerebellar artery territories). ASPECTS 10  CT angio Head and Neck with  perfusion(Personally reviewed): No carotid stenosis on either side No significant vertebrobasilar or intracranial disease  MRI Brain(Personally reviewed): ordered  ASSESSMENT   Robert Everett is a 89 y.o. male with hx of TIA (1990s), DVT 2007 who was BIB EMS as a CODE STROKE due to onset of left arm and leg weakness, left facial droop with a last known normal of 1500 yesterday.   On exam at brdige, patient alert, oriented, left facial droop, no aphasia, no dysarthria, left arm severe weakness, left leg mild weakness, no sensory deficit. CBG wNL. BP 171/55.  CTH showed age-indeterminate right PCA infarct. CTA showed no LVO, CTP showed no mismatch.   Impression: Acute/Subacute Right PCA Infarct, will need to be admitted for stroke workup.   RECOMMENDATIONS   Stroke protocol (ordered) - Frequent Neuro checks per stroke unit protocol - MRI Brain stroke protocol - TTE w/bubble study - Lipid panel - Statin - will be increased if LDL>70 or otherwise medically indicated  Lipitor 20mg  PTA - A1C - UA/UDS - Antithrombotic - Aspirin  and Plavix  (with Plavix  load 1/8) for 3 weeks, then Plavix  alone. (Patient was on Aspirin  at home pta) - DVT ppx - ok for lovenox  - SBP goal - <220, PRN labetalol if HR>60 and PRN Hydralazine if HR<60 - Telemetry monitoring for arrhythmia - 72h - Swallow screen - will be performed prior to PO intake - Stroke education - will be given - PT/OT/SLP - Dispo: admit for stroke workup  ______________________________________________________________________    Signed, [name], NP [facility]   ATTENDING ATTESTATION:  Patient came in as a  code stroke last known well was yesterday.  Woke up this morning with persistent weakness code stroke was activated.  Evaluated at the [facility] stat head CT was negative.  Completed right PCA stroke on CT.  CTA and perfusion completed showed no mismatch.  No indication for thrombectomy or TNK as perfusion was negative and he was out of the window for TNK.  On exam has left-sided arm leg weakness and left facial droop.  Stroke workup as above.  Dr. Nichola evaluated pt independently, reviewed imaging, chart, labs. Discussed and formulated plan with the Resident/APP. Changes were made to the note where appropriate. Please see APP/resident note above for details.    MDM: High. Pertinent labs, imaging results reviewed by me and considered in my decision making. Independently reviewed imaging. Medical records reviewed. Discussed the patient with another medical provider/personnel. Obtained history from someone other than the patient.     Neilson Oehlert,MD      [1] No Known Allergies [2]  Current Facility-Administered Medications:    [START ON 04/28/2024]  stroke: early stages of recovery book, , Does not apply, Once, Lehner, Erin C, NP   aspirin  EC tablet 81 mg, 81 mg, Oral, Daily, Likes, Erin C, NP   clopidogrel  (PLAVIX ) tablet 300 mg, 300 mg, Oral, Once, Likes Rocky JAYSON, NP   [START ON 04/28/2024] clopidogrel  (PLAVIX ) tablet 75 mg, 75 mg, Oral, Daily, Lehner, Erin C, NP  Current Outpatient Medications:    acetaminophen  (TYLENOL ) 500 MG tablet, Take 500 mg by mouth every 6 (six) hours as needed for headache (pain)., Disp: , Rfl:    aspirin  EC 81 MG tablet, Take 81 mg by mouth at bedtime. , Disp: , Rfl:    atorvastatin  (LIPITOR) 20 MG tablet, Take 20 mg by mouth daily., Disp: , Rfl:    diclofenac (VOLTAREN) 50 MG EC tablet, Take 50 mg by mouth 2 (two) times daily  as needed., Disp: , Rfl:    fluticasone (FLONASE) 50 MCG/ACT nasal spray, Place 2 sprays into both nostrils 4 (four) times daily as needed for  allergies or rhinitis., Disp: , Rfl:    HYDROcodone -acetaminophen  (NORCO) 5-325 MG per tablet, Take 1-2 tablets by mouth every 4 (four) hours as needed., Disp: 40 tablet, Rfl: 0   Multiple Vitamins-Minerals (HAIR/SKIN/NAILS/BIOTIN) TABS, Take 1 tablet by mouth daily., Disp: , Rfl:    Omega-3 Fatty Acids (OMEGA-3 FISH OIL) 1200 MG CAPS, Take 1,200 mg by mouth daily. , Disp: , Rfl:    OVER THE COUNTER MEDICATION, Apply 1 application topically 2 (two) times daily as needed (pain). Dragon cream, Disp: , Rfl:    Saw Palmetto 450 MG CAPS, Take 450 mg by mouth 2 (two) times daily., Disp: , Rfl:    XARELTO  STARTER PACK 15 & 20 MG TBPK, Take 15-20 mg by mouth as directed. Take as directed on package: Start with one 15mg  tablet by mouth twice a day with food. On Day 22, switch to one 20mg  tablet once a day with food., Disp: 51 each, Rfl: 0

## 2024-04-28 ENCOUNTER — Inpatient Hospital Stay (HOSPITAL_COMMUNITY)

## 2024-04-28 DIAGNOSIS — Z7982 Long term (current) use of aspirin: Secondary | ICD-10-CM | POA: Diagnosis not present

## 2024-04-28 DIAGNOSIS — I739 Peripheral vascular disease, unspecified: Secondary | ICD-10-CM

## 2024-04-28 DIAGNOSIS — E785 Hyperlipidemia, unspecified: Secondary | ICD-10-CM | POA: Diagnosis not present

## 2024-04-28 DIAGNOSIS — I6389 Other cerebral infarction: Secondary | ICD-10-CM

## 2024-04-28 DIAGNOSIS — R29898 Other symptoms and signs involving the musculoskeletal system: Secondary | ICD-10-CM | POA: Diagnosis not present

## 2024-04-28 DIAGNOSIS — I639 Cerebral infarction, unspecified: Secondary | ICD-10-CM | POA: Diagnosis not present

## 2024-04-28 DIAGNOSIS — E440 Moderate protein-calorie malnutrition: Secondary | ICD-10-CM | POA: Insufficient documentation

## 2024-04-28 LAB — CBC WITH DIFFERENTIAL/PLATELET
Abs Immature Granulocytes: 0.02 K/uL (ref 0.00–0.07)
Basophils Absolute: 0.1 K/uL (ref 0.0–0.1)
Basophils Relative: 1 %
Eosinophils Absolute: 0.3 K/uL (ref 0.0–0.5)
Eosinophils Relative: 5 %
HCT: 38.6 % — ABNORMAL LOW (ref 39.0–52.0)
Hemoglobin: 13 g/dL (ref 13.0–17.0)
Immature Granulocytes: 0 %
Lymphocytes Relative: 27 %
Lymphs Abs: 2.1 K/uL (ref 0.7–4.0)
MCH: 32.3 pg (ref 26.0–34.0)
MCHC: 33.7 g/dL (ref 30.0–36.0)
MCV: 96 fL (ref 80.0–100.0)
Monocytes Absolute: 0.6 K/uL (ref 0.1–1.0)
Monocytes Relative: 8 %
Neutro Abs: 4.5 K/uL (ref 1.7–7.7)
Neutrophils Relative %: 59 %
Platelets: 134 K/uL — ABNORMAL LOW (ref 150–400)
RBC: 4.02 MIL/uL — ABNORMAL LOW (ref 4.22–5.81)
RDW: 13.8 % (ref 11.5–15.5)
WBC: 7.6 K/uL (ref 4.0–10.5)
nRBC: 0 % (ref 0.0–0.2)

## 2024-04-28 LAB — COMPREHENSIVE METABOLIC PANEL WITH GFR
ALT: 24 U/L (ref 0–44)
AST: 20 U/L (ref 15–41)
Albumin: 3.3 g/dL — ABNORMAL LOW (ref 3.5–5.0)
Alkaline Phosphatase: 67 U/L (ref 38–126)
Anion gap: 8 (ref 5–15)
BUN: 21 mg/dL (ref 8–23)
CO2: 25 mmol/L (ref 22–32)
Calcium: 8.8 mg/dL — ABNORMAL LOW (ref 8.9–10.3)
Chloride: 108 mmol/L (ref 98–111)
Creatinine, Ser: 1.01 mg/dL (ref 0.61–1.24)
GFR, Estimated: >60 mL/min
Glucose, Bld: 108 mg/dL — ABNORMAL HIGH (ref 70–99)
Potassium: 3.7 mmol/L (ref 3.5–5.1)
Sodium: 141 mmol/L (ref 135–145)
Total Bilirubin: 0.8 mg/dL (ref 0.0–1.2)
Total Protein: 5.4 g/dL — ABNORMAL LOW (ref 6.5–8.1)

## 2024-04-28 LAB — LIPID PANEL
Cholesterol: 124 mg/dL (ref 0–200)
HDL: 42 mg/dL
LDL Cholesterol: 72 mg/dL (ref 0–99)
Total CHOL/HDL Ratio: 3 ratio
Triglycerides: 49 mg/dL
VLDL: 10 mg/dL (ref 0–40)

## 2024-04-28 LAB — MAGNESIUM: Magnesium: 2 mg/dL (ref 1.7–2.4)

## 2024-04-28 LAB — PHOSPHORUS: Phosphorus: 3.3 mg/dL (ref 2.5–4.6)

## 2024-04-28 MED ORDER — ADULT MULTIVITAMIN W/MINERALS CH
1.0000 | ORAL_TABLET | Freq: Every day | ORAL | Status: DC
  Administered 2024-04-28 – 2024-05-02 (×5): 1 via ORAL
  Filled 2024-04-28 (×5): qty 1

## 2024-04-28 MED ORDER — ENSURE PLUS HIGH PROTEIN PO LIQD
237.0000 mL | Freq: Two times a day (BID) | ORAL | Status: DC
  Administered 2024-04-29 – 2024-05-01 (×6): 237 mL via ORAL

## 2024-04-28 NOTE — Plan of Care (Signed)

## 2024-04-28 NOTE — Consult Note (Addendum)
 "     Physical Medicine and Rehabilitation Consult Reason for Consult: Evaluate appropriateness for Inpatient Rehab Referring Physician: Dr. Perri    HPI: Robert Everett is a 89 y.o. male with PMHx of  has a past medical history of Arthritis, Cancer (HCC), Diverticulosis, DVT (deep venous thrombosis) (HCC) (2007), H/O exercise stress test (2000?), Prostate enlargement, Pulmonary embolism (HCC) (2007), Seasonal allergies, and Stroke (HCC). . They were admitted to [facility] on 04/27/2024 for new left-sided weakness noted when his wife attempted to get him up in the morning.  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  CTA head and neck without significant stenosis or disease.  MRI brain with acute infarct in the posterior right corona radiata and multiple remote infarcts in the left frontal gyrus, right occipital lobe, and cerebellum along with mild to moderate chronic microvascular changes.  He was treated with aspirin  and Plavix , not a candidate for thrombolysis or thrombectomy due to out of window for intervention.  He was admitted to the telemetry service for continuous cardiac monitoring.  Hospitalization has been otherwise complicated by hypertension, intermittent bradycardia. PM&R was consulted to evaluate appropriateness for IPR admission.   Per chart review, prior to admission he was living in a 1 level home with a ramped entrance with his wife who is available 24-7 at discharge.  He is typically independent although did use a 6 point cane for short amount of time after back surgery in October.  He is independent of ADLs and IADLs.  His wife can provide supervision level assistance but no physical assistance.  Currently, he is mod assist for upper body ADLs, max assist for lower body ADLs, and moderate assist for toileting with stand pivot transfer.  He is min assist for supine to sit and sit to stand, min  assist for bed mobility due to difficulty with balance and left-sided weakness, and unable to perform steps or march in place due to left lower extremity buckling.   Review of Systems  Constitutional:  Negative for chills and fever.  HENT:  Positive for hearing loss.   Eyes:  Negative for blurred vision and double vision.  Respiratory:  Negative for cough and shortness of breath.   Cardiovascular:  Negative for chest pain and palpitations.  Gastrointestinal:  Negative for constipation, diarrhea, nausea and vomiting.  Genitourinary:  Negative for dysuria and urgency.  Musculoskeletal:  Positive for back pain.  Neurological:  Positive for focal weakness. Negative for dizziness, tingling and headaches.  Psychiatric/Behavioral:  Negative for depression. The patient is nervous/anxious. The patient does not have insomnia.    Past Medical History:  Diagnosis Date   Arthritis    hands & shoulder    Cancer (HCC)    basal cell- nose   Diverticulosis    DVT (deep venous thrombosis) (HCC) 2007   treated with coumadin- x1    H/O exercise stress test 2000?   done following what they thought was an TIA, told all was wnll   Prostate enlargement    takes saw palmetto    Pulmonary embolism (HCC) 2007   Seasonal allergies    uses Flonase nasal spray   Stroke [facility])    TIA- 1990's   Past Surgical History:  Procedure Laterality Date   INSERTION OF MESH Bilateral 06/28/2014   Procedure: INSERTION OF MESH;  Surgeon: Vicenta Poli, MD;  Location: [facility] OR;  Service: General;  Laterality: Bilateral;   LAPAROSCOPIC  INGUINAL HERNIA WITH UMBILICAL HERNIA Bilateral 06/28/2014   Procedure: LAPAROSCOPIC RIGHT AND LEFT  INGUINAL HERNIA WITH UMBILICAL HERNIA  AND MESH;  Surgeon: Vicenta Poli, MD;  Location: [facility] OR;  Service: General;  Laterality: Bilateral;   polyps removed   2010   on colonoscopy   SHOULDER SURGERY Right    rotator cuff repair , x2 on the same shoulder   TONSILLECTOMY     History  reviewed. No pertinent family history. Social History:  reports that he quit smoking about 53 years ago. His smoking use included cigarettes. He has never used smokeless tobacco. He reports that he does not drink alcohol and does not use drugs. Allergies: Allergies[1] Medications Prior to Admission  Medication Sig Dispense Refill   aspirin  EC 81 MG tablet Take 81 mg by mouth at bedtime.      atorvastatin  (LIPITOR) 20 MG tablet Take 20 mg by mouth at bedtime.     diclofenac (VOLTAREN) 50 MG EC tablet Take 50 mg by mouth 2 (two) times daily.     Multiple Vitamin (MULTIVITAMIN PO) Take 1 tablet by mouth daily.     Saw Palmetto 450 MG CAPS Take 450 mg by mouth 2 (two) times daily.      Home: Home Living Family/patient expects to be discharged to:: Private residence Living Arrangements: Spouse/significant other Available Help at Discharge: Family, Available 24 hours/day Type of Home: House Home Access: Ramped entrance Home Layout: One level Bathroom Shower/Tub: Engineer, Manufacturing Systems: Handicapped height Bathroom Accessibility: Yes Home Equipment: Grab bars - tub/shower, Grab bars - toilet, Shower seat, Other (comment), Cane - single point Additional Comments: uses urinal at night  Functional History: Prior Function Prior Level of Function : Independent/Modified Independent, Driving Mobility Comments: normally ind; was using [facility] after back surgery in [place] but had gotten away from it. ADLs Comments: INd with ADLs/IADLs. Functional Status:  Mobility: Bed Mobility Overal bed mobility: Needs Assistance Bed Mobility: Supine to Sit Supine to sit: Min assist, Mod assist Sit to supine: Min assist General bed mobility comments: Assist to bring hips forward to EOB Transfers Overall transfer level: Needs assistance Equipment used: 1 person hand held assist Transfers: Sit to/from Stand, Bed to chair/wheelchair/BSC Sit to Stand: Min assist, From elevated surface Bed to/from  chair/wheelchair/BSC transfer type:: Step pivot Step pivot transfers: Min assist, Mod assist General transfer comment: Min A for sit to stand from elevated stretcher verbal cues for safety. Ambulation/Gait General Gait Details: Pt attempted to march in place but was unable to clear the RLE from the floor without LLE buckling.    ADL: ADL Overall ADL's : Needs assistance/impaired Eating/Feeding: Set up, Sitting Grooming: Wash/dry hands, Wash/dry face, Set up, Minimal assistance, Sitting Upper Body Bathing: Moderate assistance, Sitting Lower Body Bathing: Maximal assistance, Sit to/from stand, Sitting/lateral leans Upper Body Dressing : Moderate assistance, Sitting Lower Body Dressing: Moderate assistance, Maximal assistance, Sit to/from stand, Sitting/lateral leans Toilet Transfer: Moderate assistance, Stand-pivot, BSC/3in1  Cognition: Cognition Orientation Level: Oriented X4 Cognition Arousal: Alert Behavior During Therapy: WFL for tasks assessed/performed  Blood pressure (!) 161/56, pulse (!) 52, temperature 98.3 F (36.8 C), resp. rate 16, height 5' 9 (1.753 m), weight 86.2 kg, SpO2 98%. Physical Exam  Constitutional: No apparent distress. Appropriate appearance for age.  HENT: No JVD. Neck Supple. Trachea midline. Atraumatic, normocephalic. +glasses + HOH bilaterally - hearing aides currently not charged  Eyes: PERRLA. EOMI. Visual fields grossly intact.   Cardiovascular: RRR, no murmurs/rub/gallops. No Edema. Peripheral pulses 2+  Respiratory: CTAB. No rales, rhonchi, or wheezing. On RA.  Abdomen: + bowel sounds, normoactive. No distention or tenderness.  Skin: Dry, flaking skin BL shins . MSK:      No apparent deformity.       Neurologic exam:  Cognition: AAO to person, place, time and event.   + Mild to moderate cognitive delay + ?mild L hemineglect Language: Fluent, No substitutions or neoglisms. Mild dysarthria. Names 3/3 objects correctly.   Memory: Recalls  2/3 objects at 5 minutes.  Insight: Fair insight into current condition.  Mood: Pleasant affect, appropriate mood.  Sensation: To light touch intact in BL UEs and LEs   Reflexes: 2+ in BL UE and LEs. Negative Hoffman's and babinski signs bilaterally.   CN: + L ptosis, L facial droop, L shoulder weakness,  Coordination: No apparent tremors. No ataxia on FTN, HTS on R.  Spasticity: MAS 0 in all extremities.        Strength:                RUE: 5/5 SA, 5/5 EF, 5/5 EE, 5/5 WE, 5/5 FF, 5/5 FA                LUE:  2/5 SA, 0/5 EF, 1/5 EE, 0/5 WE, 0/5 FF, tr/5 FA                RLE: 5/5 HF, 5/5 KE, 5/5  DF, 5/5  EHL, 5/5  PF                 LLE:  4-/5 HF, 4-/5 KE, 3/5  DF, 3/5  EHL, 4/5  PF     Results for orders placed or performed during the hospital encounter of 04/27/24 (from the past 24 hours)  Lipid panel     Status: None   Collection Time: 04/28/24  1:48 AM  Result Value Ref Range   Cholesterol 124 0 - 200 mg/dL   Triglycerides 49 <849 mg/dL   HDL 42 >59 mg/dL   Total CHOL/HDL Ratio 3.0 RATIO   VLDL 10 0 - 40 mg/dL   LDL Cholesterol 72 0 - 99 mg/dL  CBC with Differential/Platelet     Status: Abnormal   Collection Time: 04/28/24  1:48 AM  Result Value Ref Range   WBC 7.6 4.0 - 10.5 K/uL   RBC 4.02 (L) 4.22 - 5.81 MIL/uL   Hemoglobin 13.0 13.0 - 17.0 g/dL   HCT 61.3 (L) 60.9 - 47.9 %   MCV 96.0 80.0 - 100.0 fL   MCH 32.3 26.0 - 34.0 pg   MCHC 33.7 30.0 - 36.0 g/dL   RDW 86.1 88.4 - 84.4 %   Platelets 134 (L) 150 - 400 K/uL   nRBC 0.0 0.0 - 0.2 %   Neutrophils Relative % 59 %   Neutro Abs 4.5 1.7 - 7.7 K/uL   Lymphocytes Relative 27 %   Lymphs Abs 2.1 0.7 - 4.0 K/uL   Monocytes Relative 8 %   Monocytes Absolute 0.6 0.1 - 1.0 K/uL   Eosinophils Relative 5 %   Eosinophils Absolute 0.3 0.0 - 0.5 K/uL   Basophils Relative 1 %   Basophils Absolute 0.1 0.0 - 0.1 K/uL   Immature Granulocytes 0 %   Abs Immature Granulocytes 0.02 0.00 - 0.07 K/uL  Comprehensive metabolic panel  with GFR     Status: Abnormal   Collection Time: 04/28/24  1:48 AM  Result Value Ref Range   Sodium 141 135 - 145  mmol/L   Potassium 3.7 3.5 - 5.1 mmol/L   Chloride 108 98 - 111 mmol/L   CO2 25 22 - 32 mmol/L   Glucose, Bld 108 (H) 70 - 99 mg/dL   BUN 21 8 - 23 mg/dL   Creatinine, Ser 8.98 0.61 - 1.24 mg/dL   Calcium  8.8 (L) 8.9 - 10.3 mg/dL   Total Protein 5.4 (L) 6.5 - 8.1 g/dL   Albumin 3.3 (L) 3.5 - 5.0 g/dL   AST 20 15 - 41 U/L   ALT 24 0 - 44 U/L   Alkaline Phosphatase 67 38 - 126 U/L   Total Bilirubin 0.8 0.0 - 1.2 mg/dL   GFR, Estimated >39 >39 mL/min   Anion gap 8 5 - 15  Magnesium      Status: None   Collection Time: 04/28/24  1:48 AM  Result Value Ref Range   Magnesium  2.0 1.7 - 2.4 mg/dL  Phosphorus     Status: None   Collection Time: 04/28/24  1:48 AM  Result Value Ref Range   Phosphorus 3.3 2.5 - 4.6 mg/dL   ECHOCARDIOGRAM COMPLETE Result Date: 04/27/2024    ECHOCARDIOGRAM REPORT   Patient Name:   NACHUM DEROSSETT Date of Exam: 04/27/2024 Medical Rec #:  [id number]       Height:       69.0 in Accession #:    [id number]      Weight:       190.0 lb Date of Birth:  November 01, 1933       BSA:          2.022 m Patient Age:    89 years        BP:           174/41 mmHg Patient Gender: M               HR:           51 bpm. Exam Location:  Inpatient Procedure: 2D Echo, Cardiac Doppler, Color Doppler and Saline Contrast Bubble            Study (Both Spectral and Color Flow Doppler were utilized during            procedure). Indications:   Stroke I63.9  History:       Patient has no prior history of Echocardiogram examinations.                Stroke.  Sonographer:   Tinnie Gosling RDCS Referring      [phone] [name] Phys: IMPRESSIONS  1. Left ventricular ejection fraction, by estimation, is 60 to 65%. The left ventricle has normal function. The left ventricle has no regional wall motion abnormalities. Left ventricular diastolic parameters are consistent with Grade I diastolic dysfunction  (impaired relaxation).  2. Right ventricular systolic function is normal. The right ventricular size is normal.  3. Left atrial size was mildly dilated.  4. The mitral valve is normal in structure. Trivial mitral valve regurgitation. No evidence of mitral stenosis.  5. The aortic valve is tricuspid. There is mild calcification of the aortic valve. Aortic valve regurgitation is moderate to severe. Aortic valve sclerosis is present, with no evidence of aortic valve stenosis.  6. Aortic dilatation noted. There is borderline dilatation of the ascending aorta, measuring 38 mm.  7. Agitated saline contrast bubble study was positive with shunting observed within 3-6 cardiac cycles suggestive of interatrial shunt. Comparison(s): No prior Echocardiogram. Conclusion(s)/Recommendation(s): Very eccentric aortic regurgitation, likely moderate to severe.  Could consider TEE or cardiac MRI for further evaluation. Bubble study positive for intraatrial shunt. FINDINGS  Left Ventricle: Left ventricular ejection fraction, by estimation, is 60 to 65%. The left ventricle has normal function. The left ventricle has no regional wall motion abnormalities. The left ventricular internal cavity size was normal in size. There is  borderline left ventricular hypertrophy. Left ventricular diastolic parameters are consistent with Grade I diastolic dysfunction (impaired relaxation). Right Ventricle: The right ventricular size is normal. Right vetricular wall thickness was not well visualized. Right ventricular systolic function is normal. Left Atrium: Left atrial size was mildly dilated. Right Atrium: Right atrial size was not well visualized. Pericardium: There is no evidence of pericardial effusion. Mitral Valve: The mitral valve is normal in structure. Trivial mitral valve regurgitation. No evidence of mitral valve stenosis. Tricuspid Valve: The tricuspid valve is normal in structure. Tricuspid valve regurgitation is trivial. No evidence of  tricuspid stenosis. Aortic Valve: While 5 chamber apical views show AR filling the LVOT, this is very eccentric, and on other views this appears less severe. The aortic valve is tricuspid. There is mild calcification of the aortic valve. Aortic valve regurgitation is moderate to severe. Aortic valve sclerosis is present, with no evidence of aortic valve stenosis. Pulmonic Valve: The pulmonic valve was not well visualized. Pulmonic valve regurgitation is trivial. No evidence of pulmonic stenosis. Aorta: Aortic dilatation noted. There is borderline dilatation of the ascending aorta, measuring 38 mm. Venous: The inferior vena cava was not well visualized. IAS/Shunts: The atrial septum is grossly normal. Agitated saline contrast was given intravenously to evaluate for intracardiac shunting. Agitated saline contrast bubble study was positive with shunting observed within 3-6 cardiac cycles suggestive of interatrial shunt.  LEFT VENTRICLE PLAX 2D LVIDd:         5.30 cm      Diastology LVIDs:         3.50 cm      LV e' lateral:   3.59 cm/s LV PW:         1.10 cm      LV E/e' lateral: 10.7 LV IVS:        1.10 cm LVOT diam:     2.40 cm LV SV:         171 LV SV Index:   84 LVOT Area:     4.52 cm  LV Volumes (MOD) LV vol d, MOD A2C: 185.0 ml LV vol d, MOD A4C: 187.0 ml LV vol s, MOD A2C: 71.8 ml LV vol s, MOD A4C: 71.8 ml LV SV MOD A2C:     113.2 ml LV SV MOD A4C:     187.0 ml LV SV MOD BP:      112.6 ml RIGHT VENTRICLE RV S prime:     14.70 cm/s TAPSE (M-mode): 1.6 cm LEFT ATRIUM             Index LA diam:        5.00 cm 2.47 cm/m LA Vol (A2C):   44.3 ml 21.91 ml/m LA Vol (A4C):   64.6 ml 31.95 ml/m LA Biplane Vol: 55.3 ml 27.35 ml/m  AORTIC VALVE LVOT Vmax:   138.00 cm/s LVOT Vmean:  96.100 cm/s LVOT VTI:    0.377 m  AORTA Ao Root diam: 3.60 cm Ao Asc diam:  3.80 cm MITRAL VALVE MV Area (PHT): 3.60 cm    SHUNTS MV Decel Time: 211 msec    Systemic VTI:  0.38 m MV E velocity: 38.30 cm/s  Systemic Diam: 2.40 cm MV A  velocity: 66.60 cm/s MV E/A ratio:  0.58 Shelda Bruckner MD Electronically signed by Shelda Bruckner MD Signature Date/Time: 04/27/2024/8:17:27 PM    Final    MR BRAIN WO CONTRAST Result Date: 04/27/2024 EXAM: MRI BRAIN WITHOUT CONTRAST 04/27/2024 12:09:00 PM TECHNIQUE: Multiplanar multisequence MRI of the head/brain was performed without the administration of intravenous contrast. COMPARISON: Same day CT head and CTA head and neck. CLINICAL HISTORY: Stroke, follow up. FINDINGS: BRAIN AND VENTRICLES: There is a 1.7 cm focus of restricted diffusion in the posterior aspect of the right corona radiata compatible with an acute infarct. Encephalomalacia and gliosis in the left superior frontal gyrus compatible with remote infarct. There are multiple additional remote infarcts in the cerebellum, right more than left. Additional remote cortical infarct in the right occipital lobe. T2 and FLAIR hyperintensity in the periventricular and subcortical white matter suggestive of mild to moderate chronic microvascular ischemic changes. Mild parietal predominant age related volume loss. Chronic microhemorrhage in the left temporal lobe. No acute intracranial hemorrhage. No mass. No midline shift. No hydrocephalus. The sella is unremarkable. Normal flow voids. ORBITS: Bilateral lens replacement. SINUSES AND MASTOIDS: No significant abnormality. BONES AND SOFT TISSUES: Normal marrow signal. No significant soft tissue abnormality. IMPRESSION: 1. Acute infarct in the posterior aspect of the right corona radiata. 2. Multiple remote infarcts including the left superior frontal gyrus, right occipital lobe, and cerebellum. 3. Mild to moderate chronic microvascular ischemic changes. Electronically signed by: Donnice Mania MD MD 04/27/2024 02:13 PM EST RP Workstation: [id number]   CT ANGIO HEAD NECK W WO CM W PERF (CODE STROKE) Addendum Date: 04/27/2024 ADDENDUM REPORT: 04/27/2024 10:40 ADDENDUM: A perfusion study was also  obtained. The study is normal. No perfusion deficit identified. Electronically Signed   By: Nancyann Burns M.D.   On: 04/27/2024 10:40   Result Date: 04/27/2024 CLINICAL DATA:  Left arm weakness EXAM: CT ANGIOGRAPHY HEAD AND NECK WITH CONTRAST TECHNIQUE: Multidetector CT imaging of the head and neck was performed using the standard protocol during bolus administration of intravenous contrast. Multiplanar CT image reconstructions and MIPs were obtained to evaluate the vascular anatomy. Carotid stenosis measurements (when applicable) are obtained utilizing NASCET criteria, using the distal internal carotid diameter as the denominator. RADIATION DOSE REDUCTION: This exam was performed according to the departmental dose-optimization program which includes automated exposure control, adjustment of the mA and/or kV according to patient size and/or use of iterative reconstruction technique. CONTRAST:  OMNIPAQUE  IOHEXOL  350 MG/ML SOLN COMPARISON:  None Available. CTA NECK: CTA NECK Aortic arch: No proximal vessel stenosis. Right carotid: Normal Left carotid: Normal Right vertebral: Normal Left vertebral: Normal Soft tissues: No significant abnormality Other comments: None CTA HEAD: CTA HEAD Right anterior circulation: The internal carotid artery is patent without significant stenosis. The anterior and middle cerebral arteries are patent without significant stenosis or proximal branch occlusion. No aneurysm. Left anterior circulation: The internal carotid artery is patent without significant stenosis. The anterior and middle cerebral arteries are patent without significant stenosis or proximal branch occlusion. No aneurysm. Posterior circulation: Both vertebral arteries are patent. There is no significant basilar stenosis. Both posterior cerebral arteries are patent without significant stenosis or proximal branch occlusion. No aneurysm. IMPRESSION: No carotid stenosis on either side No significant vertebrobasilar or  intracranial disease Electronically Signed: By: Nancyann Burns M.D. On: 04/27/2024 09:57   CT HEAD CODE STROKE WO CONTRAST Result Date: 04/27/2024 EXAM: CT HEAD WITHOUT CONTRAST 04/27/2024 09:37:00 AM TECHNIQUE: CT of  the head was performed without the administration of intravenous contrast. Automated exposure control, iterative reconstruction, and/or weight based adjustment of the mA/kV was utilized to reduce the radiation dose to as low as reasonably achievable. COMPARISON: Brain MRI 01/06/2006, Head CT 10/20/2005. CLINICAL HISTORY: 89 year old male with acute neurological deficit, stroke suspected. FINDINGS: BRAIN AND VENTRICLES: No acute hemorrhage. No evidence of acute infarct. No hydrocephalus. No extra-axial collection. No mass effect or midline shift. Chronic encephalomalacia anterior left superior frontal gyrus, new since 2007. Patchy and confluent but more age indeterminate hypodensity in the inferior right parietal lobe (coronal image 65). Small but circumscribed chronic appearing cerebellar infarcts. Mild for age patchy cerebral white matter hypodensity otherwise. No suspicious intracranial vascular hyperdensity. Calcified atherosclerosis at the skull base. ORBITS: Chronic left orbital floor fracture. SINUSES: Paranasal sinuses, tympanic cavities, and mastoids are well aerated. SOFT TISSUES AND SKULL: No acute soft tissue abnormality. No skull fracture. ALBERTA STROKE PROGRAM EARLY CT SCORE (ASPECTS): Ganglionic (caudate, IC, lentiform nucleus, insula, M1-M3): 7. Supraganglionic (M4-M6): 3. Total: 10. IMPRESSION: 1. Age indeterminate right PCA territory infarct. Otherwise, chronic findings (including left MCA and bilateral cerebellar artery territories). ASPECTS 10. 2. These results were communicated to Dr. JUDITHANN Raddle at [phone] hours on 04/27/2024 by text page via the [facility] messaging system. Electronically signed by: Helayne Hurst MD MD 04/27/2024 09:45 AM EST RP Workstation: [id number]     Assessment/Plan: Diagnosis: Acute right corona radiata CVA, etiology pending workup Does the need for close, 24 hr/day medical supervision in concert with the patient's rehab needs make it unreasonable for this patient to be served in a less intensive setting?  Co-Morbidities requiring supervision/potential complications: Left-sided hemiparesis, cardiac workup for stroke etiology, hypertension, cognitive deficits. due to safety, disease management, medication administration, and patient education, does the patient require 24 hr/day rehab nursing? Yes Does the patient require coordinated care of a physician, rehab nurse, therapy disciplines of PT, OT, and SLP to address physical and functional deficits in the context of the above medical diagnosis(es)? Yes Addressing deficits in the following areas: balance, endurance, locomotion, strength, transferring, bathing, dressing, feeding, grooming, cognition, dysarthria and toileting Can the patient actively participate in an intensive therapy program of at least 3 hrs of therapy per day at least 5 days per week? Yes The potential for patient to make measurable gains while on inpatient rehab is good Anticipated functional outcomes upon discharge from inpatient rehab are supervision  with PT, supervision with OT, supervision SLP Estimated rehab length of stay to reach the above functional goals is: 10-14 days Anticipated discharge destination: Home Overall Rehab/Functional Prognosis: good  POST ACUTE RECOMMENDATIONS: This patient's condition is appropriate for continued rehabilitative care in the following setting: CIR Patient has agreed to participate in recommended program. Yes Note that insurance prior authorization may be required for reimbursement for recommended care.   I have personally performed a face to face diagnostic evaluation of this patient. Additionally, I have examined the patient's medical record including any pertinent labs and  radiographic images. If the physician assistant has documented in this note, I have reviewed and edited or otherwise concur with the physician assistant's documentation.  Thanks,  Joesph JAYSON Likes, DO 04/28/2024      [1] No Known Allergies  "

## 2024-04-28 NOTE — Plan of Care (Signed)
   Problem: Coping: Goal: Will verbalize positive feelings about self Outcome: Progressing

## 2024-04-28 NOTE — Progress Notes (Addendum)
 STROKE TEAM PROGRESS NOTE   INTERIM HISTORY/SUBJECTIVE  Seen in room with no family at the bedside.  He is still having significant left upper extremity weakness.  He states that he has been able to move his left arm and hand spontaneously, however on exam he is unable to squeeze my hand or wiggle his fingers.  He does have some strength at his shoulder  OBJECTIVE  CBC    Component Value Date/Time   WBC 7.6 04/28/2024 0148   RBC 4.02 (L) 04/28/2024 0148   HGB 13.0 04/28/2024 0148   HCT 38.6 (L) 04/28/2024 0148   PLT 134 (L) 04/28/2024 0148   MCV 96.0 04/28/2024 0148   MCH 32.3 04/28/2024 0148   MCHC 33.7 04/28/2024 0148   RDW 13.8 04/28/2024 0148   LYMPHSABS 2.1 04/28/2024 0148   MONOABS 0.6 04/28/2024 0148   EOSABS 0.3 04/28/2024 0148   BASOSABS 0.1 04/28/2024 0148    BMET    Component Value Date/Time   NA 141 04/28/2024 0148   K 3.7 04/28/2024 0148   CL 108 04/28/2024 0148   CO2 25 04/28/2024 0148   GLUCOSE 108 (H) 04/28/2024 0148   BUN 21 04/28/2024 0148   CREATININE 1.01 04/28/2024 0148   CALCIUM  8.8 (L) 04/28/2024 0148   GFRNONAA >60 04/28/2024 0148    IMAGING past 24 hours ECHOCARDIOGRAM COMPLETE Result Date: 04/27/2024    ECHOCARDIOGRAM REPORT   Patient Name:   Robert Everett Date of Exam: 04/27/2024 Medical Rec #:  [id number]       Height:       69.0 in Accession #:    [id number]      Weight:       190.0 lb Date of Birth:  [birth year]       BSA:          2.022 m Patient Age:    89 years        BP:           174/41 mmHg Patient Gender: M               HR:           51 bpm. Exam Location:  Inpatient Procedure: 2D Echo, Cardiac Doppler, Color Doppler and Saline Contrast Bubble            Study (Both Spectral and Color Flow Doppler were utilized during            procedure). Indications:   Stroke I63.9  History:       Patient has no prior history of Echocardiogram examinations.                Stroke.  Sonographer:   Tinnie Gosling RDCS Referring      [phone] Robert Everett  Phys: IMPRESSIONS  1. Left ventricular ejection fraction, by estimation, is 60 to 65%. The left ventricle has normal function. The left ventricle has no regional wall motion abnormalities. Left ventricular diastolic parameters are consistent with Grade I diastolic dysfunction (impaired relaxation).  2. Right ventricular systolic function is normal. The right ventricular size is normal.  3. Left atrial size was mildly dilated.  4. The mitral valve is normal in structure. Trivial mitral valve regurgitation. No evidence of mitral stenosis.  5. The aortic valve is tricuspid. There is mild calcification of the aortic valve. Aortic valve regurgitation is moderate to severe. Aortic valve sclerosis is present, with no evidence of aortic valve stenosis.  6. Aortic dilatation noted. There is  borderline dilatation of the ascending aorta, measuring 38 mm.  7. Agitated saline contrast bubble study was positive with shunting observed within 3-6 cardiac cycles suggestive of interatrial shunt. Comparison(s): No prior Echocardiogram. Conclusion(s)/Recommendation(s): Very eccentric aortic regurgitation, likely moderate to severe. Could consider TEE or cardiac MRI for further evaluation. Bubble study positive for intraatrial shunt. FINDINGS  Left Ventricle: Left ventricular ejection fraction, by estimation, is 60 to 65%. The left ventricle has normal function. The left ventricle has no regional wall motion abnormalities. The left ventricular internal cavity size was normal in size. There is  borderline left ventricular hypertrophy. Left ventricular diastolic parameters are consistent with Grade I diastolic dysfunction (impaired relaxation). Right Ventricle: The right ventricular size is normal. Right vetricular wall thickness was not well visualized. Right ventricular systolic function is normal. Left Atrium: Left atrial size was mildly dilated. Right Atrium: Right atrial size was not well visualized. Pericardium: There is no evidence  of pericardial effusion. Mitral Valve: The mitral valve is normal in structure. Trivial mitral valve regurgitation. No evidence of mitral valve stenosis. Tricuspid Valve: The tricuspid valve is normal in structure. Tricuspid valve regurgitation is trivial. No evidence of tricuspid stenosis. Aortic Valve: While 5 chamber apical views show AR filling the LVOT, this is very eccentric, and on other views this appears less severe. The aortic valve is tricuspid. There is mild calcification of the aortic valve. Aortic valve regurgitation is moderate to severe. Aortic valve sclerosis is present, with no evidence of aortic valve stenosis. Pulmonic Valve: The pulmonic valve was not well visualized. Pulmonic valve regurgitation is trivial. No evidence of pulmonic stenosis. Aorta: Aortic dilatation noted. There is borderline dilatation of the ascending aorta, measuring 38 mm. Venous: The inferior vena cava was not well visualized. IAS/Shunts: The atrial septum is grossly normal. Agitated saline contrast was given intravenously to evaluate for intracardiac shunting. Agitated saline contrast bubble study was positive with shunting observed within 3-6 cardiac cycles suggestive of interatrial shunt.  LEFT VENTRICLE PLAX 2D LVIDd:         5.30 cm      Diastology LVIDs:         3.50 cm      LV e' lateral:   3.59 cm/s LV PW:         1.10 cm      LV E/e' lateral: 10.7 LV IVS:        1.10 cm LVOT diam:     2.40 cm LV SV:         171 LV SV Index:   84 LVOT Area:     4.52 cm  LV Volumes (MOD) LV vol d, MOD A2C: 185.0 ml LV vol d, MOD A4C: 187.0 ml LV vol s, MOD A2C: 71.8 ml LV vol s, MOD A4C: 71.8 ml LV SV MOD A2C:     113.2 ml LV SV MOD A4C:     187.0 ml LV SV MOD BP:      112.6 ml RIGHT VENTRICLE RV S prime:     14.70 cm/s TAPSE (M-mode): 1.6 cm LEFT ATRIUM             Index LA diam:        5.00 cm 2.47 cm/m LA Vol (A2C):   44.3 ml 21.91 ml/m LA Vol (A4C):   64.6 ml 31.95 ml/m LA Biplane Vol: 55.3 ml 27.35 ml/m  AORTIC VALVE LVOT  Vmax:   138.00 cm/s LVOT Vmean:  96.100 cm/s LVOT VTI:    0.377 m  AORTA Ao Root diam: 3.60 cm Ao Asc diam:  3.80 cm MITRAL VALVE MV Area (PHT): 3.60 cm    SHUNTS MV Decel Time: 211 msec    Systemic VTI:  0.38 m MV E velocity: 38.30 cm/s  Systemic Diam: 2.40 cm MV A velocity: 66.60 cm/s MV E/A ratio:  0.58 Robert Bruckner MD Electronically signed by Robert Bruckner MD Signature Date/Time: 04/27/2024/8:17:27 PM    Final    MR BRAIN WO CONTRAST Result Date: 04/27/2024 EXAM: MRI BRAIN WITHOUT CONTRAST 04/27/2024 12:09:00 PM TECHNIQUE: Multiplanar multisequence MRI of the head/brain was performed without the administration of intravenous contrast. COMPARISON: Same day CT head and CTA head and neck. CLINICAL HISTORY: Stroke, follow up. FINDINGS: BRAIN AND VENTRICLES: There is a 1.7 cm focus of restricted diffusion in the posterior aspect of the right corona radiata compatible with an acute infarct. Encephalomalacia and gliosis in the left superior frontal gyrus compatible with remote infarct. There are multiple additional remote infarcts in the cerebellum, right more than left. Additional remote cortical infarct in the right occipital lobe. T2 and FLAIR hyperintensity in the periventricular and subcortical white matter suggestive of mild to moderate chronic microvascular ischemic changes. Mild parietal predominant age related volume loss. Chronic microhemorrhage in the left temporal lobe. No acute intracranial hemorrhage. No mass. No midline shift. No hydrocephalus. The sella is unremarkable. Normal flow voids. ORBITS: Bilateral lens replacement. SINUSES AND MASTOIDS: No significant abnormality. BONES AND SOFT TISSUES: Normal marrow signal. No significant soft tissue abnormality. IMPRESSION: 1. Acute infarct in the posterior aspect of the right corona radiata. 2. Multiple remote infarcts including the left superior frontal gyrus, right occipital lobe, and cerebellum. 3. Mild to moderate chronic microvascular  ischemic changes. Electronically signed by: Robert Mania MD MD 04/27/2024 02:13 PM EST RP Workstation: [id number]   CT ANGIO HEAD NECK W WO CM W PERF (CODE STROKE) Addendum Date: 04/27/2024 ADDENDUM REPORT: 04/27/2024 10:40 ADDENDUM: A perfusion study was also obtained. The study is normal. No perfusion deficit identified. Electronically Signed   By: Robert Everett M.D.   On: 04/27/2024 10:40   Result Date: 04/27/2024 CLINICAL DATA:  Left arm weakness EXAM: CT ANGIOGRAPHY HEAD AND NECK WITH CONTRAST TECHNIQUE: Multidetector CT imaging of the head and neck was performed using the standard protocol during bolus administration of intravenous contrast. Multiplanar CT image reconstructions and MIPs were obtained to evaluate the vascular anatomy. Carotid stenosis measurements (when applicable) are obtained utilizing NASCET criteria, using the distal internal carotid diameter as the denominator. RADIATION DOSE REDUCTION: This exam was performed according to the departmental dose-optimization program which includes automated exposure control, adjustment of the mA and/or kV according to patient size and/or use of iterative reconstruction technique. CONTRAST:  OMNIPAQUE  IOHEXOL  350 MG/ML SOLN COMPARISON:  None Available. CTA NECK: CTA NECK Aortic arch: No proximal vessel stenosis. Right carotid: Normal Left carotid: Normal Right vertebral: Normal Left vertebral: Normal Soft tissues: No significant abnormality Other comments: None CTA HEAD: CTA HEAD Right anterior circulation: The internal carotid artery is patent without significant stenosis. The anterior and middle cerebral arteries are patent without significant stenosis or proximal branch occlusion. No aneurysm. Left anterior circulation: The internal carotid artery is patent without significant stenosis. The anterior and middle cerebral arteries are patent without significant stenosis or proximal branch occlusion. No aneurysm. Posterior circulation: Both vertebral  arteries are patent. There is no significant basilar stenosis. Both posterior cerebral arteries are patent without significant stenosis or proximal branch occlusion. No aneurysm. IMPRESSION: No carotid stenosis  on either side No significant vertebrobasilar or intracranial disease Electronically Signed: By: Robert Everett M.D. On: 04/27/2024 09:57   CT HEAD CODE STROKE WO CONTRAST Result Date: 04/27/2024 EXAM: CT HEAD WITHOUT CONTRAST 04/27/2024 09:37:00 AM TECHNIQUE: CT of the head was performed without the administration of intravenous contrast. Automated exposure control, iterative reconstruction, and/or weight based adjustment of the mA/kV was utilized to reduce the radiation dose to as low as reasonably achievable. COMPARISON: Brain MRI 01/06/2006, Head CT 10/20/2005. CLINICAL HISTORY: 89 year old male with acute neurological deficit, stroke suspected. FINDINGS: BRAIN AND VENTRICLES: No acute hemorrhage. No evidence of acute infarct. No hydrocephalus. No extra-axial collection. No mass effect or midline shift. Chronic encephalomalacia anterior left superior frontal gyrus, new since 2007. Patchy and confluent but more age indeterminate hypodensity in the inferior right parietal lobe (coronal image 65). Small but circumscribed chronic appearing cerebellar infarcts. Mild for age patchy cerebral white matter hypodensity otherwise. No suspicious intracranial vascular hyperdensity. Calcified atherosclerosis at the skull base. ORBITS: Chronic left orbital floor fracture. SINUSES: Paranasal sinuses, tympanic cavities, and mastoids are well aerated. SOFT TISSUES AND SKULL: No acute soft tissue abnormality. No skull fracture. ALBERTA STROKE PROGRAM EARLY CT SCORE (ASPECTS): Ganglionic (caudate, IC, lentiform nucleus, insula, M1-M3): 7. Supraganglionic (M4-M6): 3. Total: 10. IMPRESSION: 1. Age indeterminate right PCA territory infarct. Otherwise, chronic findings (including left MCA and bilateral cerebellar artery  territories). ASPECTS 10. 2. These results were communicated to Dr. JUDITHANN Everett at [phone] hours on 04/27/2024 by text page via the [facility] messaging system. Electronically signed by: Robert Hurst MD MD 04/27/2024 09:45 AM EST RP Workstation: [id number]    Vitals:   04/27/24 2323 04/28/24 0418 04/28/24 0741 04/28/24 0828  BP: (!) 145/53 (!) 164/49 (!) 174/47 (!) 161/56  Pulse: 60 (!) 50 (!) 54 (!) 52  Resp: 19  16   Temp: 98 F (36.7 C) 98.2 F (36.8 C) 98.3 F (36.8 C)   TempSrc: Oral Oral    SpO2: 95% 96% 95% 98%  Weight:      Height:         PHYSICAL EXAM General:  Alert, well-nourished, well-developed patient in no acute distress Psych:  Mood and affect appropriate for situation CV: Regular rate and rhythm on monitor Respiratory:  Regular, unlabored respirations on room air GI: Abdomen soft and nontender   NEURO:  Mental Status: AA&Ox3, patient is able to give clear and coherent history Speech/Language: speech is without dysarthria or aphasia.  Naming, repetition, fluency, and comprehension intact.  Cranial Nerves:  II: PERRL. Visual fields full.  III, IV, VI: EOMI. Eyelids elevate symmetrically.  V: Sensation is intact to light touch and symmetrical to face.  VII: Left facial droop  VIII: hearing intact to voice. IX, X: Palate elevates symmetrically. Phonation is normal.  KP:Dynloizm shrug weak on the left XII: tongue is midline without fasciculations. Motor:  LUE 1/5 RUE 5/5 LLE 3+/5 RLE 5/5 Tone: is normal and bulk is normal Sensation- Intact to light touch bilaterally. Extinction absent to light touch to DSS.   Coordination: FTN intact bilaterally, HKS: no ataxia in BLE.No drift.  Gait- deferred  ASSESSMENT/PLAN  Robert Everett is a 89 y.o. male with history of TIA (1990s), DVT 2007 (treated with Coumadin x1), BCC, R Shoulder arthritis and surgeries who was BIB EMS as a CODE STROKE due to onset of left arm and leg weakness, left facial droop.   NIH on Admission  5  Stroke: right corona radiata infarct, etiology likely small vessel  disease Code Stroke CT head -  Age indeterminate right PCA territory infarct. ASPECTS 10. CTA head & neck - unremarkable CT perfusion - no perfusion deficit MRI  Acute infarct in the posterior aspect of the right corona radiata. Multiple remote infarcts including the left superior frontal gyrus, right occipital lobe, and cerebellum. 2D Echo EF 60-65%, LA mildly dilated, positive bubble study  LE venous doppler chronic DVT at left distal popliteal vein. No acute DVT.  LDL 90 HgbA1c 5.8 UDS neg VTE prophylaxis - lovenox  aspirin  81 mg daily prior to admission, now on aspirin  81 mg daily and clopidogrel  75 mg daily for 3 weeks and then plavix  alone. Therapy recommendations:  CIR Disposition:  Pending   Hx of TIA/stroke vs. Head trauma Per chart TIA in 1990s MRI showed Multiple remote infarcts including the left superior frontal gyrus, right occipital lobe, and cerebellum. However, pt stated that 9-10 years ago he had fall from high, hit head and was unconscious for a week  Home meds: asa 81mg    Hyperlipidemia Home meds:  atorvastatin  20mg   LDL 90, goal < 70 Increase atorvastatin  to 40mg   Continue statin at discharge  Other stroke risk factors Advanced age Hx of DVT/PE 2007  Other Active Problems Arthritis- diclofenac Diverticulitis  Hospital day # 1  Patient seen and examined by NP/APP with MD. MD to update note as needed.   Robert Last, DNP, FNP-BC [facility] Pager: [phone]  ATTENDING NOTE: I reviewed above note and agree with the assessment and plan. Pt was seen and examined.   No family is at the bedside. Pt lying in bed, awake, alert, eyes open, orientated to age, place, time. No aphasia, fluent language, following all simple commands. Able to name and repeat, slight dysarthria. No gaze palsy, tracking bilaterally, visual field full. L facial droop. Tongue midline. RUE and RLE  5/5. LUE 2-/5 and LLE 4/5. Sensation symmetrical bilaterally, R FTN intact, gait not tested.   For detailed assessment and plan, please refer to above as I have made changes wherever appropriate.   Neurology will sign off. Please call with questions. Pt will follow up with stroke clinic NP at [facility] in about 4 weeks. Thanks for the consult.   Robert Cummins, MD PhD Stroke Neurology 04/28/2024 2:28 PM    To contact Stroke Continuity provider, please refer to [email or web address]. After hours, contact General Neurology

## 2024-04-28 NOTE — Progress Notes (Signed)
 Bilateral lower extremity venous duplex has been completed.  Results can be found in chart review under CV Proc.  04/28/2024 5:12 PM  Laylonie Marzec Elden Appl, RVT.

## 2024-04-28 NOTE — Progress Notes (Signed)
 Initial Nutrition Assessment  DOCUMENTATION CODES:  Non-severe (moderate) malnutrition in context of social or environmental circumstances  INTERVENTION:   Heart Healthy Nutrition Therapy handout added to discharge instructions. Add Ensure Plus High Protein po BID, each supplement provides 350 kcal and 20 grams of protein. Add MVI with minerals daily. RN to re-weight patient.  NUTRITION DIAGNOSIS:  Moderate Malnutrition related to social / environmental circumstances as evidenced by mild muscle depletion, mild fat depletion.  GOAL:  Patient will meet greater than or equal to 90% of their needs  MONITOR:  PO intake, Supplement acceptance  REASON FOR ASSESSMENT:  Consult Assessment of nutrition requirement/status  ASSESSMENT:  89 yo male admitted with acute infarct in the posterior aspect of the right corona radiata. PMH includes TIA 1990's, DVT, arthritis, basal cell cancer, diverticulosis.  Patient reports good intake at home and now. He usually eats a Location Manager stuffed potato meal for breakfast, a spam & cheese sandwich or a minute meal for lunch, and a minute meal for dinner. He prepares meals for himself and his wife. Intake seems to be very high in sodium and fat. Suspect protein intake is inadequate. He was having trouble opening his crackers during RD visit because he can't use his L arm. He agreed to drink Ensure supplements to help improve protein intake.   Plans for transfer to CIR soon. Patient states he is ready to start on therapy, so he can get stronger and go home soon.   Patient meets criteria for moderate malnutrition, given mild depletion of muscle and subcutaneous fat mass.  Usual weight: 165 lbs per patient Current weight: 86.2 kg (190 lbs) ? Accuracy of weight. RN to re-weigh patient today.   Average Meal Intake: 1/8: 35% intake x 1 recorded meals  Nutritionally Relevant Medications: Scheduled Meds:  aspirin  EC  81 mg Oral Daily   atorvastatin   40  mg Oral Daily   clopidogrel   75 mg Oral Daily   heparin   5,000 Units Subcutaneous Q8H   Continuous Infusions: PRN Meds:.acetaminophen  **OR** acetaminophen  (TYLENOL ) oral liquid 160 mg/5 mL **OR** acetaminophen , senna-docusate  Labs Reviewed: CBG 104 (1/8) HgbA1c 5.8  NUTRITION - FOCUSED PHYSICAL EXAM: Flowsheet Row Most Recent Value  Orbital Region Mild depletion  Upper Arm Region No depletion  Thoracic and Lumbar Region Mild depletion  Buccal Region Mild depletion  Temple Region Mild depletion  Clavicle Bone Region Mild depletion  Clavicle and Acromion Bone Region Mild depletion  Scapular Bone Region Mild depletion  Dorsal Hand Mild depletion  Patellar Region Mild depletion  Anterior Thigh Region Mild depletion  Posterior Calf Region Mild depletion  Edema (RD Assessment) None  Hair Reviewed  Eyes Reviewed  Mouth Reviewed  Skin Reviewed  Nails Reviewed    Diet Order:   Diet Order             Diet Heart Room service appropriate? Yes with Assist; Fluid consistency: Thin  Diet effective now                  EDUCATION NEEDS:  Education needs have been addressed  Skin:  Skin Assessment: Reviewed RN Assessment  Last BM:  1/8  Height:  Ht Readings from Last 1 Encounters:  04/27/24 5' 9 (1.753 m)   Weight:  Wt Readings from Last 1 Encounters:  04/27/24 86.2 kg   Ideal Body Weight:  72.7 kg  BMI:  Body mass index is 28.06 kg/m.  Estimated Nutritional Needs:  Kcal:  1950-2150 Protein:  95-115 gm  Fluid:  1.95-2.15 L   Suzen HUNT RD, LDN, CNSC Contact via secure chat. If unavailable, use group chat RD Inpatient.

## 2024-04-28 NOTE — PMR Pre-admission (Signed)
 PMR Admission Coordinator Pre-Admission Assessment  Patient: Robert Everett is an 89 y.o., male MRN: [id number] DOB: 1933-09-23 Height: 5' 9 (175.3 cm) Weight: 86.2 kg  Insurance Information HMO:   yes  PPO:      PCP:      IPA:      80/20:      OTHER:  PRIMARY:  [employer] Medicare     Policy#: [id number], Medicare: [id number]      Subscriber:  CM Name: UM Dept      Phone#: [phone]     Fax#: [phone] Pre-Cert#: [id number] I received auth for CIR from Heather  with [facility] for admit 05/01/24 through 05/08/24.  Updates due to 05/08/24 at fax listed above. Benefits:  Phone #: [phone]     Name: [phone] Eff Date: 04/20/2024 - still active Deductible: $0 (does not have deductible) OOP Max: $6,200 ($0 met)    CIR: $550/day co-pay for days 1-5, $0/day co-pay for days 6+  SNF: $0.00 Copayment per day for days 1-20; $218 Copayment per day for days 21-100 for Medicare-covered care/maximum 100 days/benefit period  Outpatient: $35 copay/visit Home Health:  100% coverage DME: 80% coverage; 20% co-insurance  Providers: in network  SECONDARY:       Policy#:      Phone#:   Artist:       Phone#:   The Best Boy for patients in Inpatient Rehabilitation Facilities with attached Privacy Act Statement-Health Care Records was provided and verbally reviewed with: Patient  Emergency Contact Information Contact Information     Name Relation Home Work Mobile   [place] Spouse [phone]     [place] Daughter [phone]     Morrison, Mcbryar   [phone]      Other Contacts   None on File     Current Medical History  Patient Admitting Diagnosis: CVA History of Present Illness: Robert Everett is a 89 y.o. male with past medical history  of TIA, DVT, right shoulder surgeries, low back pain hyperlipidemia who was brought to the [facility] emergency room 04/27/24 with left-sided weakness wife at bedside states that patient  was normal yesterday and this morning when she was trying to get him out of bed noted that he could not move his left upper extremity.  ded weakness noted when his wife attempted to get him up in the morning.  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  CTA head and neck without significant stenosis or disease.  MRI brain with acute infarct in the posterior right corona radiata and multiple remote infarcts in the left frontal gyrus, right occipital lobe, and cerebellum along with mild to moderate chronic microvascular changes.  He was treated with aspirin  and Plavix , not a candidate for thrombolysis or thrombectomy due to out of window for intervention.  He was admitted to the telemetry service for continuous cardiac monitoring.  Hospitalization has been otherwise complicated by hypertension, intermittent bradycardia. PM&R was consulted to evaluate appropriateness for IPR admission.     Complete NIHSS TOTAL: 7  Patient's medical record from [facility] has been reviewed by the rehabilitation admission coordinator and physician.  Past Medical History  Past Medical History:  Diagnosis Date   Arthritis    hands & shoulder    Cancer (HCC)    basal cell- nose   Diverticulosis    DVT (deep venous thrombosis) (HCC) 2007   treated with coumadin- x1  H/O exercise stress test 2000?   done following what they thought was an TIA, told all was wnll   Prostate enlargement    takes saw palmetto    Pulmonary embolism (HCC) 2007   Seasonal allergies    uses Flonase nasal spray   Stroke [facility])    TIA- 1990's    Has the patient had major surgery during 100 days prior to admission? No  Family History   family history is not on file.  Current Medications Current Medications[1]  Patients Current Diet:  Diet Order             Diet NPO time specified Except for: Sips with Meds  Diet effective now                    Precautions / Restrictions Precautions Precautions: Fall Restrictions Weight Bearing Restrictions Per Provider Order: No   Has the patient had 2 or more falls or a fall with injury in the past year? No  Prior Activity Level Community (5-7x/wk): Pt. active in the community PTA  Prior Functional Level Self Care: Did the patient need help bathing, dressing, using the toilet or eating? Independent  Indoor Mobility: Did the patient need assistance with walking from room to room (with or without device)? Independent  Stairs: Did the patient need assistance with internal or external stairs (with or without device)? Independent  Functional Cognition: Did the patient need help planning regular tasks such as shopping or remembering to take medications? Independent  Patient Information Are you of Hispanic, Latino/a,or Spanish origin?: A. No, not of Hispanic, Latino/a, or Spanish origin What is your race?: A. White Do you need or want an interpreter to communicate with a doctor or health care staff?: 0. No  Patient's Response To:  Health Literacy and Transportation Is the patient able to respond to health literacy and transportation needs?: Yes Health Literacy - How often do you need to have someone help you when you read instructions, pamphlets, or other written material from your doctor or pharmacy?: Never In the past 12 months, has lack of transportation kept you from medical appointments or from getting medications?: No In the past 12 months, has lack of transportation kept you from meetings, work, or from getting things needed for daily living?: No  Journalist, Newspaper / Equipment Home Equipment: Grab bars - tub/shower, Grab bars - toilet, Shower seat, Other (comment), Cane - single point  Prior Device Use: Indicate devices/aids used by the patient prior to current illness, exacerbation or injury? Walker  Current Functional Level Cognition  Arousal/Alertness:  Awake/alert Overall Cognitive Status: No family/caregiver present to determine baseline cognitive functioning Orientation Level: Oriented X4 Attention: Sustained Sustained Attention: Appears intact Memory: Impaired Memory Impairment: Storage deficit, Retrieval deficit, Decreased recall of new information Awareness: Impaired Awareness Impairment: Emergent impairment Problem Solving: Impaired Problem Solving Impairment: Verbal basic Safety/Judgment: Impaired    Extremity Assessment (includes Sensation/Coordination)  Upper Extremity Assessment: Right hand dominant, LUE deficits/detail LUE Deficits / Details: 2+/5 to the shoulder, no AROM noted to elbow, forearm, wrist or hand. LUE Sensation: decreased light touch LUE Coordination: decreased fine motor, decreased gross motor  Lower Extremity Assessment: Defer to PT evaluation LLE Deficits / Details: 4-/5 strength LLE Sensation: decreased proprioception LLE Coordination: decreased gross motor    ADLs  Overall ADL's : Needs assistance/impaired Eating/Feeding: Set up, Sitting Grooming: Wash/dry hands, Wash/dry face, Brushing hair, Minimal assistance, Sitting (assistance with combing hair) Upper Body Bathing: Moderate assistance, Sitting Upper  Body Bathing Details (indicate cue type and reason): assistance for bathing RUE and back Lower Body Bathing: Moderate assistance, Maximal assistance, Sit to/from stand Lower Body Bathing Details (indicate cue type and reason): patient able to bathe peri area back and upper legs while sitting and required max assist for peri area back while standing Upper Body Dressing : Moderate assistance, Sitting Upper Body Dressing Details (indicate cue type and reason): gown change Lower Body Dressing: Moderate assistance, Maximal assistance, Sit to/from stand, Sitting/lateral leans Toilet Transfer: Moderate assistance, Stand-pivot, BSC/3in1    Mobility  Overal bed mobility: Needs Assistance Bed Mobility: Sit  to Supine Supine to sit: Min assist Sit to supine: Min assist General bed mobility comments: assist for raising LLE    Transfers  Overall transfer level: Needs assistance Equipment used: Rolling walker (2 wheels), 1 person hand held assist Transfers: Sit to/from Stand Sit to Stand: Min assist Bed to/from chair/wheelchair/BSC transfer type:: Step pivot Step pivot transfers: Mod assist General transfer comment: RW used with assistance for LUE grip to allow for increased stability    Ambulation / Gait / Stairs / Wheelchair Mobility  Ambulation/Gait Ambulation/Gait assistance: Mod assist Gait Distance (Feet): 60 Feet Assistive device: Rolling walker (2 wheels) Gait Pattern/deviations: Step-through pattern, Decreased step length - left, Decreased dorsiflexion - left, Drifts right/left General Gait Details: held Lt hand on RW and assisted with all steering/turning of RW Gait velocity interpretation: <1.8 ft/sec, indicate of risk for recurrent falls Pre-gait activities: bilateral weight-shifts with 1UE support and assist to shift weight    Posture / Balance Dynamic Sitting Balance Sitting balance - Comments: CGA for EOB due to posterior bias Balance Overall balance assessment: Needs assistance Sitting-balance support: Feet supported Sitting balance-Leahy Scale: Fair Sitting balance - Comments: CGA for EOB due to posterior bias Postural control: Posterior lean Standing balance support: Single extremity supported, During functional activity, Reliant on assistive device for balance Standing balance-Leahy Scale: Poor Standing balance comment: reliant on external support due to posterior leaning    Special considerations/life events  Special service needs none    Previous Home Environment (from acute therapy documentation) Living Arrangements: Spouse/significant other  Lives With: Spouse Available Help at Discharge: Family, Available 24 hours/day Type of Home: House Home Layout: One  level Home Access: Ramped entrance Bathroom Shower/Tub: Engineer, Manufacturing Systems: Handicapped height Bathroom Accessibility: Yes How Accessible: Accessible via walker Home Care Services: No Additional Comments: uses urinal at night  Discharge Living Setting Plans for Discharge Living Setting: Patient's home Type of Home at Discharge: House Discharge Home Layout: One level Discharge Home Access: Ramped entrance Discharge Bathroom Shower/Tub: Tub/shower unit Discharge Bathroom Toilet: Handicapped height Discharge Bathroom Accessibility: Yes How Accessible: Accessible via walker Does the patient have any problems obtaining your medications?: No  Social/Family/Support Systems Patient Roles: Spouse Contact Information: [phone] Anticipated Caregiver: Arista Anticipated Caregiver's Contact Information: Wife 24/7 supervision to light min A Ability/Limitations of Caregiver: light min A Caregiver Availability: 24/7 Discharge Plan Discussed with Primary Caregiver: Yes Is Caregiver In Agreement with Plan?: Yes Does Caregiver/Family have Issues with Lodging/Transportation while Pt is in Rehab?: No  Goals Patient/Family Goal for Rehab: PT/OT/SLP Supervision Expected length of stay: 10-12 days Pt/Family Agrees to Admission and willing to participate: Yes Program Orientation Provided & Reviewed with Pt/Caregiver Including Roles  & Responsibilities: Yes  Decrease burden of Care through IP rehab admission:  Not anticipated  Possible need for SNF placement upon discharge: not anticipated  Patient Condition: I have reviewed medical records from [facility], spoken with CM, and patient and family member. I met with patient at the bedside for inpatient rehabilitation assessment.  Patient will benefit from ongoing PT, OT, and SLP, can actively participate in 3 hours of therapy a day 5 days of the week, and can make measurable gains during the admission.  Patient will  also benefit from the coordinated team approach during an Inpatient Acute Rehabilitation admission.  The patient will receive intensive therapy as well as Rehabilitation physician, nursing, social worker, and care management interventions.  Due to safety, skin/wound care, disease management, medication administration, pain management, and patient education the patient requires 24 hour a day rehabilitation nursing.  The patient is currently min-mod A with mobility and basic ADLs.  Discharge setting and therapy post discharge at home with home health is anticipated.  Patient has agreed to participate in the Acute Inpatient Rehabilitation Program and will admit today.  Preadmission Screen Completed By:  Leita KATHEE Kleine, 05/02/2024 10:39 AM ______________________________________________________________________   Discussed status with Dr. Babs on 05/02/24 at 900 and received approval for admission today.  Admission Coordinator:  Leita KATHEE Kleine, CCC-SLP, time 1030/Date 05/02/24   Assessment/Plan: Diagnosis: Right PCA infarct Does the need for close, 24 hr/day Medical supervision in concert with the patient's rehab needs make it unreasonable for this patient to be served in a less intensive setting? Yes Co-Morbidities requiring supervision/potential complications: prior CVA's, DVT, polyarthritis Due to bladder management, bowel management, safety, skin/wound care, disease management, medication administration, pain management, and patient education, does the patient require 24 hr/day rehab nursing? Yes Does the patient require coordinated care of a physician, rehab nurse, PT, OT, and SLP to address physical and functional deficits in the context of the above medical diagnosis(es)? Yes Addressing deficits in the following areas: balance, endurance, locomotion, strength, transferring, bowel/bladder control, bathing, dressing, feeding, grooming, toileting, cognition, speech, and psychosocial support Can the  patient actively participate in an intensive therapy program of at least 3 hrs of therapy 5 days a week? Yes The potential for patient to make measurable gains while on inpatient rehab is excellent Anticipated functional outcomes upon discharge from inpatient rehab: supervision PT, supervision OT, supervision SLP Estimated rehab length of stay to reach the above functional goals is: 10-12 days Anticipated discharge destination: Home 10. Overall Rehab/Functional Prognosis: excellent   MD Signature: Arthea IVAR Babs, MD, [facility] Physical Medicine & Rehabilitation Medical Director Rehabilitation Services 05/02/2024      [1]  Current Facility-Administered Medications:    0.9 %  sodium chloride  infusion, , Intravenous, Continuous, Claudene Pacific, MD   acetaminophen  (TYLENOL ) tablet 650 mg, 650 mg, Oral, Q4H PRN **OR** acetaminophen  (TYLENOL ) 160 MG/5ML solution 650 mg, 650 mg, Per Tube, Q4H PRN **OR** acetaminophen  (TYLENOL ) suppository 650 mg, 650 mg, Rectal, Q4H PRN, Tobie Mario GAILS, MD   aspirin  EC tablet 81 mg, 81 mg, Oral, Daily, Judithe, Erin C, NP, 81 mg at 05/01/24 1004   atorvastatin  (LIPITOR) tablet 40 mg, 40 mg, Oral, Daily, Patel, Ekta V, MD, 40 mg at 05/01/24 1004   clopidogrel  (PLAVIX ) tablet 75 mg, 75 mg, Oral, Daily, Lehner, Erin C, NP, 75 mg at 05/01/24 1004   feeding supplement (ENSURE PLUS HIGH PROTEIN) liquid 237 mL, 237 mL, Oral, BID BM, Perri DELENA Meliton Mickey., MD, 237 mL at 05/01/24 1319   heparin  injection 5,000 Units, 5,000 Units, Subcutaneous, Q8H, Tobie Mario GAILS, MD, 5,000 Units at 05/02/24 9367   multivitamin with minerals tablet 1 tablet, 1 tablet, Oral,  Daily, Perri DELENA Meliton Mickey., MD, 1 tablet at 05/01/24 1004   senna-docusate (Senokot-S) tablet 1 tablet, 1 tablet, Oral, QHS PRN, Tobie Mario GAILS, MD

## 2024-04-28 NOTE — Evaluation (Signed)
 Occupational Therapy Evaluation Patient Details Name: Robert Everett MRN: [id number] DOB: 1933-09-17 Today's Date: 04/28/2024   History of Present Illness   Pt is 89 yo presenting to [facility] on 1/8 due to L arm/leg weakness, L facial droop. MRI performed; neurologist note not yet in. PMH: TIA, DVT, BCC, R shoulder arthritis     Clinical Impressions Patient admitted for the diagnosis above.  PTA he lives at home with his spouse, who can provide supportive assist, but not physical assist.  Patient demonstrates the ability to participate with a higher intensity rehab, and could reach a Mod I level with a combination of wheelchair use.  OT will continue efforts in the acute setting to address deficits listed.  Patient will benefit from intensive inpatient follow-up therapy, >3 hours/day.     If plan is discharge home, recommend the following:   Assist for transportation;A lot of help with bathing/dressing/bathroom;A lot of help with walking and/or transfers     Functional Status Assessment   Patient has had a recent decline in their functional status and demonstrates the ability to make significant improvements in function in a reasonable and predictable amount of time.     Equipment Recommendations   BSC/3in1;Tub/shower bench     Recommendations for Other Services         Precautions/Restrictions   Precautions Precautions: Fall Recall of Precautions/Restrictions: Intact Restrictions Weight Bearing Restrictions Per Provider Order: No     Mobility Bed Mobility Overal bed mobility: Needs Assistance Bed Mobility: Supine to Sit     Supine to sit: Min assist, Mod assist     General bed mobility comments: Assist to bring hips forward to EOB    Transfers Overall transfer level: Needs assistance Equipment used: 1 person hand held assist Transfers: Sit to/from Stand, Bed to chair/wheelchair/BSC Sit to Stand: Min assist, From elevated surface     Step pivot  transfers: Min assist, Mod assist            Balance Overall balance assessment: Needs assistance Sitting-balance support: Single extremity supported, Feet unsupported Sitting balance-Leahy Scale: Good     Standing balance support: Bilateral upper extremity supported, Single extremity supported Standing balance-Leahy Scale: Poor                             ADL either performed or assessed with clinical judgement   ADL Overall ADL's : Needs assistance/impaired Eating/Feeding: Set up;Sitting   Grooming: Wash/dry hands;Wash/dry face;Set up;Minimal assistance;Sitting   Upper Body Bathing: Moderate assistance;Sitting   Lower Body Bathing: Maximal assistance;Sit to/from stand;Sitting/lateral leans   Upper Body Dressing : Moderate assistance;Sitting   Lower Body Dressing: Moderate assistance;Maximal assistance;Sit to/from stand;Sitting/lateral leans   Toilet Transfer: Moderate assistance;Stand-pivot;BSC/3in1                   Vision Baseline Vision/History: 1 Wears glasses Vision Assessment?: No apparent visual deficits     Perception Perception: Within Functional Limits       Praxis Praxis: WFL       Pertinent Vitals/Pain Pain Assessment Pain Assessment: No/denies pain     Extremity/Trunk Assessment Upper Extremity Assessment Upper Extremity Assessment: Right hand dominant;LUE deficits/detail LUE Deficits / Details: 2+/5 to the shoulder, no AROM noted to elbow, forearm, wrist or hand. LUE Sensation: decreased light touch LUE Coordination: decreased fine motor;decreased gross motor   Lower Extremity Assessment Lower Extremity Assessment: Defer to PT evaluation   Cervical / Trunk Assessment Cervical /  Trunk Assessment: Normal   Communication Communication Communication: Impaired Factors Affecting Communication: Hearing impaired;Difficulty expressing self;Reduced clarity of speech   Cognition Arousal: Alert Behavior During Therapy: WFL for  tasks assessed/performed Cognition: No apparent impairments                               Following commands: Intact       Cueing  General Comments   Cueing Techniques: Verbal cues;Visual cues      Exercises     Shoulder Instructions      Home Living Family/patient expects to be discharged to:: Private residence Living Arrangements: Spouse/significant other Available Help at Discharge: Family;Available 24 hours/day Type of Home: House Home Access: Ramped entrance     Home Layout: One level     Bathroom Shower/Tub: Chief Strategy Officer: Handicapped height Bathroom Accessibility: Yes How Accessible: Accessible via walker Home Equipment: Grab bars - tub/shower;Grab bars - toilet;Shower seat;Other (comment);Cane - single point          Prior Functioning/Environment Prior Level of Function : Independent/Modified Independent;Driving             Mobility Comments: normally ind; was using [facility] after back surgery in [place] but had gotten away from it. ADLs Comments: INd with ADLs/IADLs.    OT Problem List: Decreased strength;Decreased range of motion;Impaired balance (sitting and/or standing);Impaired sensation   OT Treatment/Interventions: Self-care/ADL training;Balance training;Therapeutic exercise;Therapeutic activities;DME and/or AE instruction;Visual/perceptual remediation/compensation;Patient/family education      OT Goals(Current goals can be found in the care plan section)   Acute Rehab OT Goals Patient Stated Goal: Return home OT Goal Formulation: With patient Time For Goal Achievement: 04/28/24 Potential to Achieve Goals: Good ADL Goals Pt Will Perform Grooming: with min assist;standing Pt Will Perform Upper Body Bathing: with min assist;sitting Pt Will Perform Lower Body Bathing: sitting/lateral leans;sit to/from stand;with min assist Pt Will Perform Upper Body Dressing: with contact guard assist;sitting Pt Will  Perform Lower Body Dressing: with min assist;sitting/lateral leans;sit to/from stand Pt Will Transfer to Toilet: with min assist;ambulating;regular height toilet Pt Will Perform Toileting - Clothing Manipulation and hygiene: with min assist   OT Frequency:  Min 2X/week    Co-evaluation              AM-PAC OT 6 Clicks Daily Activity     Outcome Measure Help from another person eating meals?: A Little Help from another person taking care of personal grooming?: A Little Help from another person toileting, which includes using toliet, bedpan, or urinal?: A Lot Help from another person bathing (including washing, rinsing, drying)?: A Lot Help from another person to put on and taking off regular upper body clothing?: A Lot Help from another person to put on and taking off regular lower body clothing?: A Lot 6 Click Score: 14   End of Session Equipment Utilized During Treatment: Gait belt Nurse Communication: Mobility status  Activity Tolerance: Patient tolerated treatment well Patient left: in chair;with call bell/phone within reach  OT Visit Diagnosis: Unsteadiness on feet (R26.81);Muscle weakness (generalized) (M62.81);Hemiplegia and hemiparesis Hemiplegia - Right/Left: Left Hemiplegia - dominant/non-dominant: Non-Dominant Hemiplegia - caused by: Cerebral infarction                Time: 9169-9149 OT Time Calculation (min): 20 min Charges:  OT General Charges $OT Visit: 1 Visit OT Evaluation $OT Eval Moderate Complexity: 1 Mod  04/28/2024  RP, OTR/L  Acute Rehabilitation Services  Office:  [phone]   Charlie BIRCH Tenita Cue 04/28/2024, 9:09 AM

## 2024-04-28 NOTE — TOC Initial Note (Signed)
 Transition of Care [facility]) - Initial/Assessment Note    Patient Details  Name: Robert Everett MRN: [id number] Date of Birth: Apr 24, 1933  Transition of Care [facility]) CM/SW Contact:    Andrez JULIANNA George, RN Phone Number: 04/28/2024, 10:50 AM  Clinical Narrative:                  Robert Everett is a 89 y.o. male with past medical history  of TIA, DVT, right shoulder surgeries, low back pain hyperlipidemia. Admitted with Lt sided weakness.  Pt is from home with his spouse. Spouse is with him all the time.  Pt manages his medications and does the driving. Spouse doesn't drive.   Current recommendations are for CIR. Awaiting eval.  CM following.  Expected Discharge Plan: IP Rehab Facility Barriers to Discharge: Continued Medical Work up   Patient Goals and CMS Choice   CMS Medicare.gov Compare Post Acute Care list provided to:: Patient Choice offered to / list presented to : Patient      Expected Discharge Plan and Services   Discharge Planning Services: CM Consult Post Acute Care Choice: IP Rehab Living arrangements for the past 2 months: Single Family Home                                      Prior Living Arrangements/Services Living arrangements for the past 2 months: Single Family Home Lives with:: Spouse Patient language and need for interpreter reviewed:: Yes Do you feel safe going back to the place where you live?: Yes        Care giver support system in place?: Yes (comment) Current home services: DME (cane/ walker/ shower seat) Criminal Activity/Legal Involvement Pertinent to Current Situation/Hospitalization: No - Comment as needed  Activities of Daily Living   ADL Screening (condition at time of admission) Independently performs ADLs?: Yes (appropriate for developmental age) Is the patient deaf or have difficulty hearing?: No Does the patient have difficulty seeing, even when wearing glasses/contacts?: No Does the patient have difficulty concentrating,  remembering, or making decisions?: No  Permission Sought/Granted                  Emotional Assessment Appearance:: Appears stated age Attitude/Demeanor/Rapport: Engaged Affect (typically observed): Accepting Orientation: : Oriented to Self, Oriented to Place, Oriented to  Time, Oriented to Situation   Psych Involvement: No (comment)  Admission diagnosis:  Weakness of left upper extremity [R29.898] Stroke-like symptoms [R29.90] Patient Active Problem List   Diagnosis Date Noted   Weakness of left upper extremity 04/27/2024   PCP:  Merilee, L.Addie, MD (Inactive) Pharmacy:   [facility] [address] [place] - [address] AT [facility] [address] [place] [address] Phone: [phone] Fax: [phone]     Social Drivers of Health (SDOH) Social History: SDOH Screenings   Food Insecurity: No Food Insecurity (04/27/2024)  Housing: Low Risk (04/27/2024)  Transportation Needs: No Transportation Needs (04/27/2024)  Utilities: Not At Risk (04/27/2024)  Social Connections: Socially Integrated (04/27/2024)  Tobacco Use: Medium Risk (04/27/2024)   SDOH Interventions:     Readmission Risk Interventions     No data to display

## 2024-04-28 NOTE — Evaluation (Signed)
 Speech Language Pathology Evaluation Patient Details Name: Robert Everett MRN: [id number] DOB: 07-14-1933 Today's Date: 04/28/2024 Time: 8851-8799 SLP Time Calculation (min) (ACUTE ONLY): 12 min  Problem List:  Patient Active Problem List   Diagnosis Date Noted   Weakness of left upper extremity 04/27/2024   Past Medical History:  Past Medical History:  Diagnosis Date   Arthritis    hands & shoulder    Cancer (HCC)    basal cell- nose   Diverticulosis    DVT (deep venous thrombosis) (HCC) 2007   treated with coumadin- x1    H/O exercise stress test 2000?   done following what they thought was an TIA, told all was wnll   Prostate enlargement    takes saw palmetto    Pulmonary embolism (HCC) 2007   Seasonal allergies    uses Flonase nasal spray   Stroke [facility])    TIA- 1990's   Past Surgical History:  Past Surgical History:  Procedure Laterality Date   INSERTION OF MESH Bilateral 06/28/2014   Procedure: INSERTION OF MESH;  Surgeon: Vicenta Poli, MD;  Location: [facility] OR;  Service: General;  Laterality: Bilateral;   LAPAROSCOPIC INGUINAL HERNIA WITH UMBILICAL HERNIA Bilateral 06/28/2014   Procedure: LAPAROSCOPIC RIGHT AND LEFT  INGUINAL HERNIA WITH UMBILICAL HERNIA  AND MESH;  Surgeon: Vicenta Poli, MD;  Location: [facility] OR;  Service: General;  Laterality: Bilateral;   polyps removed   2010   on colonoscopy   SHOULDER SURGERY Right    rotator cuff repair , x2 on the same shoulder   TONSILLECTOMY     HPI:  Pt is 89 yo presenting [facility]8 due to L arm/leg weakness, L facial droop. MRI 1. Acute infarct in the posterior aspect of the right corona radiata.  2. 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  3. Mild to moderate chronic microvascular ischemic changes. PMH: TIA, DVT, BCC, R shoulder arthritis   Assessment / Plan / Recommendation Clinical Impression  Pt seen for cognitive-linguistic evaluation.  Evaluation completed via informal means. Assessment likely impacted by pt's hearing status. Pt presents with cognitive-linguistic deficits affecting situational orientation, auditory comprehension (moderately complex-complex), memory (short term, working), problem solving (verbal), and insight/safety awareness. Speech is fluent and largely appropriate. Mild dysarthria noted with articulatory imprecision. ?pt's ability to synthesize complex information. Some confusion re: d/c plan, pt stating he was d/c'ing imminently to CIR; however, physician entering room to evaluate him stated she was their to assess appropriateness for CIR. Pt would benefit from formal cognitive-linguistic assessment. Recommend continued ST services acute and post-acute for above mentioned deficits.    SLP Assessment  SLP Recommendation/Assessment: Patient needs continued Speech Language Pathology Services SLP Visit Diagnosis: Dysarthria and anarthria (R47.1);Cognitive communication deficit (R41.841)     Assistance Recommended at Discharge  Frequent or constant Supervision/Assistance  Functional Status Assessment Patient has had a recent decline in their functional status and demonstrates the ability to make significant improvements in function in a reasonable and predictable amount of time.  Frequency and Duration min 2x/week  2 weeks      SLP Evaluation Cognition  Overall Cognitive Status: No family/caregiver present to determine baseline cognitive functioning Arousal/Alertness: Awake/alert Orientation Level: Oriented to person;Oriented to place;Oriented to time;Disoriented to situation Attention: Sustained Sustained Attention: Appears intact Memory: Impaired Memory Impairment: Storage deficit;Retrieval deficit;Decreased recall of new information Awareness: Impaired Awareness Impairment: Emergent impairment Problem Solving: Impaired Problem Solving Impairment: Verbal basic Safety/Judgment: Impaired  Comprehension  Auditory Comprehension Overall Auditory Comprehension: Impaired Yes/No Questions: Within Functional Limits Commands: Impaired Two Step Basic Commands: 75-100% accurate Complex Commands: 50-74% accurate Conversation: Simple Interfering Components: Hearing EffectiveTechniques: Extra processing time;Repetition;Slowed speech;Visual/Gestural cues Visual Recognition/Discrimination Discrimination: Not tested Reading Comprehension Reading Status: Not tested    Expression Expression Primary Mode of Expression: Verbal Verbal Expression Overall Verbal Expression: Impaired Initiation: No impairment Automatic Speech: Name;Social Response (WFL) Level of Generative/Spontaneous Verbalization: Phrase;Sentence Repetition: No impairment Naming: No impairment (common objects) Pragmatics: No impairment Written Expression Written Expression: Not tested   Oral / Motor  Oral Motor/Sensory Function Overall Oral Motor/Sensory Function: Within functional limits Motor Speech Overall Motor Speech: Impaired Respiration: Within functional limits Phonation: Normal Resonance: Within functional limits Articulation: Impaired Level of Impairment:  (all levels) Intelligibility: Intelligible Motor Planning: Within functional limits Motor Speech Errors: Not applicable Interfering Components: Hearing loss Effective Techniques: Increased vocal intensity;Over-articulate;Slow rate           Delon Bangs, M.S., CCC-SLP Speech-Language Pathologist Secure Chat Preferred  O: [phone]  Delon CHRISTELLA Bangs 04/28/2024, 12:21 PM

## 2024-04-28 NOTE — Progress Notes (Signed)
 Inpatient Rehab Admissions Coordinator:    I met with Pt to discuss potential  CIR admit. He is interested and states that  wife can provide supervision at d/c. I spoke with Pt.'s wife and daughter and they confirm she can provide supervision to light min assist at dc. I will send case to insurance once rehab MD consult is complete.  Leita Kleine, MS, CCC-SLP Rehab Admissions Coordinator  [phone] (celll) [phone] (office)

## 2024-04-28 NOTE — Discharge Instructions (Signed)
Heart-Healthy Nutrition Therapy  A heart-healthy diet is recommended to reduce your unhealthy blood cholesterol levels, manage high blood pressure, and lower your risk for heart disease. To follow a heart-healthy diet, Eat a balanced diet with whole grains, fruits and vegetables, and lean protein sources. Achieve and maintain a healthy weight. Choose heart-healthy unsaturated fats. Limit saturated fats, trans fats, and cholesterol intake. Eat more plant-based or vegetarian meals using beans and soy foods for protein. Eat whole, unprocessed foods to limit the amount of sodium (salt) you eat. Limit refined carbohydrates especially sugar, sweets and sugar-sweetened beverages. If you drink alcohol, do so in moderation: one serving per day (women) and two servings per day (men). One serving is equivalent to 12 ounces beer, 5 ounces wine, or 1.5 ounces distilled spirits  Tips Tips for Choosing Heart-Healthy Fats Choose lean protein and low-fat dairy foods to reduce saturated fat intake. Saturated fat is usually found in animal-based protein and is associated with certain health risks. Saturated fat is the biggest contributor to raised low-density lipoprotein (LDL) cholesterol levels in the diet. Research shows that limiting saturated fat lowers unhealthy cholesterol levels. Eat no more than 5-6% of your total calories each day from saturated fat. Ask your registered dietitian nutritionist (RDN) to help you determine how much saturated fat is right for you. There are many foods that do not contain large amounts of saturated fats. Swapping these foods to replace foods high in saturated fats will help you limit the saturated fat you eat and improve your cholesterol levels. You can also try eating more plant-based or vegetarian meals. Instead of. Try:  Whole milk, cheese, yogurt, and ice cream 1%, %, or skim milk, low-fat cheese, non-fat yogurt, and low-fat ice cream  Fatty, marbled beef and pork Lean  beef, pork, or venison  Poultry with skin Poultry without skin  Butter, stick margarine Reduced-fat, whipped, or liquid spreads  Coconut oil, palm oil Liquid vegetable oils: corn, canola, olive, soybean and safflower oils   Avoid trans fats. Trans fats increase levels of LDL-cholesterol. Hydrogenated fat in processed foods is the main source of trans fats in foods.  Trans fats can be found in stick margarine, shortening, processed sweets, baked goods, some fried foods, and packaged foods made with hydrogenated oils. Avoid foods with "partially hydrogenated oil" on the ingredient list such as: cookies, pastries, baked goods, biscuits, crackers, microwave popcorn, and frozen dinners. Choose foods with heart healthy fats. Polyunsaturated and monounsaturated fat are unsaturated fats that may help lower your blood cholesterol level when used in place of saturated fat in your diet. Ask your RDN about taking a dietary supplement with plant sterols and stanols to help lower your cholesterol level. Research shows that substituting saturated fats with unsaturated fats is beneficial to cholesterol levels. Try these easy swaps:   Instead of. Try:  Butter, stick margarine, or solid shortening Reduced-fat, whipped, or liquid spreads  Beef, pork, or poultry with skin   Fish and seafood  Chips, crackers, snack foods Raw or unsalted nuts and seeds or nut butters Hummus with vegetables Avocado on toast  Coconut oil, palm oil Liquid vegetable oils: corn, canola, olive, soybean and safflower oils    Limit the amount of cholesterol you eat to less than 200 milligrams per day. Cholesterol is a substance carried through the bloodstream via lipoproteins, which are known as "transporters" of fat. Some body functions need cholesterol to work properly, but too much cholesterol in the bloodstream can damage arteries and build up blood   vessel linings (which can lead to heart attack and stroke). You should eat less than  200 milligrams cholesterol per day. People respond differently to eating cholesterol. There is no test available right now that can figure out which people will respond more to dietary cholesterol and which will respond less. For individuals with high intake of dietary cholesterol, different types of increase (none, small, moderate, large) in LDL-cholesterol levels are all possible.   Food sources of cholesterol include egg yolks and organ meats such as liver, gizzards.  Limit egg yolks to two to four per week and avoid organ meats like liver and gizzards to control cholesterol intake.  Tips for Choosing Heart-Healthy Carbohydrates Consume foods rich in viscous (soluble) fiber Viscous, or soluble, fiber is found in the walls of plant cells. Viscous fiber is found only in plant-based foods--animal-based foods like meat or dairy products do not contain fiber. In the stomach, viscous fibers absorb water and swell to form a thick, jelly-like mass. This helps to lower your unhealthy cholesterol Rich sources of viscous fiber include asparagus, Brussels sprouts, sweet potatoes, turnips, apricots, mangoes, oranges, legumes, barley, oats, and oat bran. Eat at least 5 to 10 grams of viscous fiber each day. As you increase your fiber intake gradually, also increase the amount of water you drink. This will help prevent constipation. If you have difficulty achieving this goal, ask your RDN about fiber laxatives. Choose fiber supplements made with viscous fibers such as psyllium seed husks or methylcellulose to help lower unhealthy cholesterol. Limit refined carbohydrates There are three types of carbohydrates: starches, sugar, and fiber. Some carbohydrates occur naturally in food, like the starches in rice or corn or the sugars in fruits and milk. Refined carbohydrates--foods with high amounts of simple sugars--can raise triglyceride levels. High triglyceride levels are associated with coronary heart disease. Some  examples of refined carbohydrate foods are table sugar, sweets, and beverages sweetened with added sugar. Tips for Reducing Sodium (Salt) Although sodium is important for your body to function, too much sodium can be harmful for people with high blood pressure.  As sodium and fluid buildup in your tissues and bloodstream, your blood pressure increases. High blood pressure may cause damage to other organs and increase your risk for a stroke. Keep your salt intake to 2300 milligrams or less per day. Even if you take a pill for blood pressure or a water pill (diuretic) to remove fluid, it is still important to have less salt in your diet. Ask your RDN what amount of sodium is right for you. Avoid processed foods.  Eat more fresh foods. Fresh fruits and vegetables are naturally low in sodium, as well as frozen vegetables and fruits that have no added juices or sauces. Fresh meats are lower in sodium than processed meats, such as bacon, sausage, and hotdogs.  Read the nutrition label or ask your butcher to help you find a fresh meat that is low in sodium. Eat less salt--at the table and when cooking. A single teaspoon of table salt has 2,300 mg of sodium. Leave the salt out of recipes for pasta, casseroles, and soups. Ask your RDN how to cook your favorite recipes without sodium Be a smart shopper. Look for food packages that say "salt-free" or "sodium-free." These items contain less than 5 milligrams of sodium per serving. "Very low-sodium" products contain less than 35 milligrams of sodium per serving. "Low-sodium" products contain less than 140 milligrams of sodium per serving.  Beware of "reduced salt" or "reduced   sodium" products.  These items may still be high in sodium. Check the nutrition label.  Add flavors to your food without adding sodium. Try lemon juice, lime juice, fruit juice or vinegar.   Dry or fresh herbs add flavor. Try basil, bay leaf, dill, rosemary, parsley, sage, dry mustard,  nutmeg, thyme, and paprika. Pepper, red pepper flakes, and cayenne pepper can add spice to your meals without adding sodium. Hot sauce contains sodium, but if you use just a drop or two, it will not add up to much. Buy a sodium-free seasoning blend or make your own at home.    Additional Lifestyle Tips Achieve and maintain a healthy weight. Talk with your RDN or your doctor about what is a healthy weight for you. Set goals to reach and maintain that weight.  To lose weight, reduce your calorie intake along with increasing your physical activity. A weight loss of 10 to 15 pounds could reduce LDL-cholesterol by 5 milligrams per deciliter. Participate in physical activity. Talk with your health care team to find out what types of physical activity are best for you. Set a plan to get about 30 minutes of exercise on most days.   Foods to Choose or to Limit Food Group Foods to Choose Foods to Limit  Grains Whole grain breads and cereals, including whole wheat, barley, rye, buckwheat, corn, teff, quinoa, millet, amaranth, brown or wild rice, sorghum, and oats Pasta, especially whole wheat or other whole grain types Brown rice, quinoa or wild rice Whole grain crackers, bread, rolls, pitas Home-made bread with reduced-sodium baking soda Breads or crackers topped with salt Cereals (hot or cold) with more than 300 mg sodium per serving Biscuits, cornbread, and other "quick" breads prepared with baking soda Bread crumbs or stuffing mix from a store High-fat bakery products, such as doughnuts, biscuits, croissants, danish pastries, pies, cookies Instant cooking foods to which you add hot water and stir--potatoes, noodles, rice, etc. Packaged starchy foods--seasoned noodle or rice dishes, stuffing mix, macaroni and cheese dinner Snacks made with partially hydrogenated oils, including chips, cheese puffs, snack mixes, regular crackers, butter-flavored popcorn  Protein Foods Lean cuts of beef and pork  (loin, leg, round, extra lean hamburger) Skinless poultry Fish Venison and other wild game Dried beans and peas Nuts and nut butters (unsalted) Seeds and seed butters (unsalted) Meat alternatives made with soy or textured vegetable protein Egg whites or egg substitute Cold cuts made with lean meat or soy protein Higher-fat cuts of meats (ribs, t-bone steak, regular hamburger) Bacon, sausage, or hot dogs Cold cuts, such as salami or bologna, deli meats, cured meats, corned beef Organ meats (liver, brains, gizzards, sweetbreads) Poultry with skin Fried or smoked meat, poultry, and fish Whole eggs and egg yolks (more than 2-4 per week) Salted legumes, nuts, seeds, or nut/seed butters Meat alternatives with high levels of sodium (>300 mg per serving) or saturated fat (>5 g per serving)  Dairy and Dairy Alternatives Nonfat (skim), low-fat, or 1%-fat milk Nonfat or low-fat yogurt or cottage cheese Fat-free and low-fat cheese Fortified non-dairy milk: almond, cashew, pea, and soy Whole milk, 2% fat milk, buttermilk Whole milk yogurt or ice cream Cream, half-&-half Cream cheese Sour cream Cheese  Vegetables Fresh, frozen, or canned vegetables without added fat or salt Avocados Canned or frozen vegetables with salt, fresh vegetables prepared with salt, butter, cheese, or cream sauce Fried vegetables Pickled vegetables such as olives, pickles, or sauerkraut  Fruits Fresh, frozen, canned, or dried fruit Fried fruits Fruits   served with butter or cream  Oils Unsaturated oils (corn, olive, peanut, soy, sunflower, canola) Soft or liquid margarines and vegetable oil spreads Salad dressings made from saturated fats Butter, stick margarine, shortening Partially hydrogenated oils or trans fats Tropical oils (coconut, palm, palm kernel oils)  Other Prepared or homemade foods, including soups, casseroles, and salads made from recommended ingredients and contain <600 mg sodium. Low-sodium seasonings  (ketchup, barbeque sauce) Spices, herbs, Salt-free seasoning mixes and marinades Vinegar Lemon or lime juice Prepared foods, including soups, casseroles, and salads made from recommended ingredients and contain >600 mg sodium. Frozen meals and prepared sides that are >600 mg of sodium Sugary and/or fatty desserts, candy, and other sweets Salts:  sea salt, kosher salt, onion salt, and garlic salt, seasoning mixes containing salt Flavorings: bouillon cubes, catsup or ketchup, barbeque sauce, Worcestershire sauce, soy sauce, salsa, relish, teriyaki sauce   Heart-Healthy Sample 1-Day Menu View Nutrient Info Breakfast 1 cup oatmeal 1 cup fat-free milk 1 cup blueberries 1 ounce almonds, unsalted 1 cup brewed coffee  Lunch 2 slices whole-wheat bread 2 ounces lean turkey breast 1 ounce low-fat Swiss cheese 2 slices tomato 2 lettuce leaves 1 pear 1 cup skim milk  Afternoon Snack 1 ounce trail mix with unsalted nuts, seeds, and raisins  Evening Meal 3 ounces broiled salmon 2/3 cup brown rice 1 teaspoon margarine, soft tub  cup broccoli, cooked  cup carrots, cooked 1 cup tossed salad 1 teaspoon olive oil and vinegar dressing 1 small whole-wheat roll 1 teaspoon margarine, soft tub 1 cup tea  Evening Snack 1 small banana  Daily Sum Nutrient Unit Value  Macronutrients  Energy kcal 2069  Energy kJ 8655  Protein g 146  Total lipid (fat) g 69  Carbohydrate, by difference g 228  Fiber, total dietary g 33  Sugars, total g 85  Minerals  Calcium, Ca mg 1292  Iron, Fe mg 14  Sodium, Na mg 1751  Vitamins  Vitamin C, total ascorbic acid mg 85  Vitamin A, IU IU 17756  Vitamin D IU 231  Lipids  Fatty acids, total saturated g 12  Fatty acids, total monounsaturated g 27  Fatty acids, total polyunsaturated g 24  Cholesterol mg 270     Heart-Healthy Vegan 1-Day Sample Menu View Nutrient Info Breakfast 1 slice whole wheat toast 2 tablespoons peanut butter without salt Tofu scramble  made with:  cup calcium-set tofu  cup green pepper  cup spinach  cup tomatoes  cup white mushrooms 1 teaspoon canola oil 1 cup soymilk fortified with calcium, vitamin B12, and vitamin D  Lunch 1 cup reduced sodium split pea soup 1 whole wheat dinner roll 1 medium apple  Dinner Salad made with: 1 cup lentils  cup cooked broccoli  cup cooked carrots 2 tablespoons hummus 1 cup sliced strawberries 1 cup soymilk fortified with calcium, vitamin B12, and vitamin D  Evening Snack 1 cup soy yogurt  cup mixed nuts  Daily Sum Nutrient Unit Value  Macronutrients  Energy kcal 1672  Energy kJ 7000  Protein g 86  Total lipid (fat) g 65  Carbohydrate, by difference g 205  Fiber, total dietary g 47  Sugars, total g 73  Minerals  Calcium, Ca mg 1443  Iron, Fe mg 19  Sodium, Na mg 1148  Vitamins  Vitamin C, total ascorbic acid mg 253  Vitamin A, IU IU 15451  Vitamin D IU 361  Lipids  Fatty acids, total saturated g 11  Fatty acids, total monounsaturated g   29  Fatty acids, total polyunsaturated g 19  Cholesterol mg 5     Heart-Healthy Vegetarian (Lacto-Ovo) Sample 1-Day Menu View Nutrient Info Breakfast 1 cup cooked oatmeal 1 tablespoon ground flaxseed 1 cup blueberries 2 scrambled egg whites with 1 teaspoon canola oil 2 tablespoons salsa 1 cup fat-free milk 1 cup coffee Oil, canola  Lunch 2 slices whole wheat bread 2 tablespoons peanut butter without salt 1 small banana 6 ounces fat-free plain yogurt 1 cup sliced red pepper 2 tablespoons hummus 1 cup fat-free milk  Evening Meal Stir fry made with:  cup tofu 1 cup brown rice  cup cooked broccoli  cup cooked carrots  cup cooked green beans 1 teaspoon peanut oil  Evening Snack 1 slice low-fat mozzarella cheese 1 medium apple  Daily Sum Nutrient Unit Value  Macronutrients  Energy kcal 1764  Energy kJ 7375  Protein g 87  Total lipid (fat) g 53  Carbohydrate, by difference g 254  Fiber, total dietary g 35   Sugars, total g 98  Minerals  Calcium, Ca mg 1609  Iron, Fe mg 12  Sodium, Na mg 1434  Vitamins  Vitamin C, total ascorbic acid mg 210  Vitamin A, IU IU 16317  Vitamin D IU 234  Lipids  Fatty acids, total saturated g 12  Fatty acids, total monounsaturated g 21  Fatty acids, total polyunsaturated g 15  Cholesterol mg 31    Copyright 2020  [employer]. All rights reserved  

## 2024-04-28 NOTE — Progress Notes (Signed)
 " PROGRESS NOTE    Robert Everett  FMW:[id number] DOB: 1934-01-23 DOA: 04/27/2024 PCP: Merilee, L.Addie, MD (Inactive)  Chief Complaint  Patient presents with   Code Stroke    Brief Narrative:   89 yo with hx TIA, VTE, dyslipidemia who presented with L sided weakness and was found to have Robert [name] stroke.   Neurology is following, now on DAPT.   Plan is for CIR.  Assessment & Plan:   Principal Problem:   Weakness of left upper extremity  Acute CVA MRI with stroke in the posterior aspect of the [name].  Multiple remote infarcts including the L superior frontal gyrus, [name], cerebellum. CTA head/neck without carotid stenosis, no significant vertebrobasilar or intracranial disease Echo with preserved EF, no RWMA, positive bubble study suggestive of interatrial shunt LE US  pending LDL 72, A1c 5.8 Appreciate neurology recommendations - cryptogenic strokes - recommending DAPT for 3 weeks then plavix  alone.  Lipitor increased to 40 mg.  Eccentric Aortic Regurgitation Likely moderate to severe Will defer additional workup to outpatient setting  Hypertension  Permissive hypertension, not on any home BP meds  Dyslipidemia Lipitor increased as above  Prediabetes A1c 5.8     DVT prophylaxis: heparin  Code Status: full Family Communication: none Disposition:   Status is: Inpatient Remains inpatient appropriate because: need for continued neurology follow up   Consultants:  neurology  Procedures:  Echo IMPRESSIONS     1. Left ventricular ejection fraction, by estimation, is 60 to 65%. The  left ventricle has normal function. The left ventricle has no regional  wall motion abnormalities. Left ventricular diastolic parameters are  consistent with Grade I diastolic  dysfunction (impaired relaxation).   2. Right ventricular systolic function is normal. The right ventricular  size is normal.   3. Left atrial size was mildly dilated.   4.  The mitral valve is normal in structure. Trivial mitral valve  regurgitation. No evidence of mitral stenosis.   5. The aortic valve is tricuspid. There is mild calcification of the  aortic valve. Aortic valve regurgitation is moderate to severe. Aortic  valve sclerosis is present, with no evidence of aortic valve stenosis.   6. Aortic dilatation noted. There is borderline dilatation of the  ascending aorta, measuring 38 mm.   7. Agitated saline contrast bubble study was positive with shunting  observed within 3-6 cardiac cycles suggestive of interatrial shunt.   Comparison(s): No prior Echocardiogram.   Conclusion(s)/Recommendation(s): Very eccentric aortic regurgitation,  likely moderate to severe. Could consider TEE or cardiac MRI for further  evaluation. Bubble study positive for intraatrial shunt.   Antimicrobials:  Anti-infectives (From admission, onward)    None       Subjective: No complaints Continued L sided weakness  Objective: Vitals:   04/28/24 0418 04/28/24 0741 04/28/24 0828 04/28/24 1144  BP: (!) 164/49 (!) 174/47 (!) 161/56 (!) 143/46  Pulse: (!) 50 (!) 54 (!) 52 (!) 58  Resp:  16  16  Temp: 98.2 F (36.8 C) 98.3 F (36.8 C)  98.6 F (37 C)  TempSrc: Oral     SpO2: 96% 95% 98% 97%  Weight:      Height:        Intake/Output Summary (Last 24 hours) at 04/28/2024 1342 Last data filed at 04/28/2024 0740 Gross per 24 hour  Intake 580 ml  Output 750 ml  Net -170 ml   Filed Weights   04/27/24 0934  Weight: 86.2 kg  Examination:  General exam: Appears calm and comfortable  Respiratory system: Clear to auscultation. Respiratory effort normal. Cardiovascular system: RRR Central nervous system: L sided weakness Extremities: no LEE   Data Reviewed: I have personally reviewed following labs and imaging studies  CBC: Recent Labs  Lab 04/27/24 0930 04/27/24 0938 04/28/24 0148  WBC 6.0  --  7.6  NEUTROABS 3.5  --  4.5  HGB 14.3 14.3 13.0  HCT  42.3 42.0 38.6*  MCV 96.8  --  96.0  PLT 154  --  134*    Basic Metabolic Panel: Recent Labs  Lab 04/27/24 0930 04/27/24 0938 04/28/24 0148  NA 142 144 141  K 3.8 3.8 3.7  CL 109 107 108  CO2 24  --  25  GLUCOSE 112* 112* 108*  BUN 20 23 21   CREATININE 0.96 1.00 1.01  CALCIUM  9.2  --  8.8*  MG  --   --  2.0  PHOS  --   --  3.3    GFR: Estimated Creatinine Clearance: 55 mL/min (by C-G formula based on SCr of 1.01 mg/dL).  Liver Function Tests: Recent Labs  Lab 04/27/24 0930 04/28/24 0148  AST 26  25 20   ALT 30  30 24   ALKPHOS 80  81 67  BILITOT 1.0  1.0 0.8  PROT 6.4*  6.4* 5.4*  ALBUMIN 3.9  3.9 3.3*    CBG: Recent Labs  Lab 04/27/24 0930  GLUCAP 104*     No results found for this or any previous visit (from the past 240 hours).       Radiology Studies: ECHOCARDIOGRAM COMPLETE Result Date: 04/27/2024    ECHOCARDIOGRAM REPORT   Patient Name:   Robert Everett Date of Exam: 04/27/2024 Medical Rec #:  [id number]       Height:       69.0 in Accession #:    [id number]      Weight:       190.0 lb Date of Birth:  1934-04-05       BSA:          2.022 m Patient Age:    89 years        BP:           174/41 mmHg Patient Gender: M               HR:           51 bpm. Exam Location:  Inpatient Procedure: 2D Echo, Cardiac Doppler, Color Doppler and Saline Contrast Bubble            Study (Both Spectral and Color Flow Doppler were utilized during            procedure). Indications:   Stroke I63.9  History:       Patient has no prior history of Echocardiogram examinations.                Stroke.  Sonographer:   Robert Everett RDCS Referring      [phone] Robert Everett Phys: IMPRESSIONS  1. Left ventricular ejection fraction, by estimation, is 60 to 65%. The left ventricle has normal function. The left ventricle has no regional wall motion abnormalities. Left ventricular diastolic parameters are consistent with Grade I diastolic dysfunction (impaired relaxation).  2. Right  ventricular systolic function is normal. The right ventricular size is normal.  3. Left atrial size was mildly dilated.  4. The mitral valve is normal in structure. Trivial mitral valve regurgitation. No  evidence of mitral stenosis.  5. The aortic valve is tricuspid. There is mild calcification of the aortic valve. Aortic valve regurgitation is moderate to severe. Aortic valve sclerosis is present, with no evidence of aortic valve stenosis.  6. Aortic dilatation noted. There is borderline dilatation of the ascending aorta, measuring 38 mm.  7. Agitated saline contrast bubble study was positive with shunting observed within 3-6 cardiac cycles suggestive of interatrial shunt. Comparison(s): No prior Echocardiogram. Conclusion(s)/Recommendation(s): Very eccentric aortic regurgitation, likely moderate to severe. Could consider TEE or cardiac MRI for further evaluation. Bubble study positive for intraatrial shunt. FINDINGS  Left Ventricle: Left ventricular ejection fraction, by estimation, is 60 to 65%. The left ventricle has normal function. The left ventricle has no regional wall motion abnormalities. The left ventricular internal cavity size was normal in size. There is  borderline left ventricular hypertrophy. Left ventricular diastolic parameters are consistent with Grade I diastolic dysfunction (impaired relaxation). Right Ventricle: The right ventricular size is normal. Right vetricular wall thickness was not well visualized. Right ventricular systolic function is normal. Left Atrium: Left atrial size was mildly dilated. Right Atrium: Right atrial size was not well visualized. Pericardium: There is no evidence of pericardial effusion. Mitral Valve: The mitral valve is normal in structure. Trivial mitral valve regurgitation. No evidence of mitral valve stenosis. Tricuspid Valve: The tricuspid valve is normal in structure. Tricuspid valve regurgitation is trivial. No evidence of tricuspid stenosis. Aortic Valve:  While 5 chamber apical views show AR filling the LVOT, this is very eccentric, and on other views this appears less severe. The aortic valve is tricuspid. There is mild calcification of the aortic valve. Aortic valve regurgitation is moderate to severe. Aortic valve sclerosis is present, with no evidence of aortic valve stenosis. Pulmonic Valve: The pulmonic valve was not well visualized. Pulmonic valve regurgitation is trivial. No evidence of pulmonic stenosis. Aorta: Aortic dilatation noted. There is borderline dilatation of the ascending aorta, measuring 38 mm. Venous: The inferior vena cava was not well visualized. IAS/Shunts: The atrial septum is grossly normal. Agitated saline contrast was given intravenously to evaluate for intracardiac shunting. Agitated saline contrast bubble study was positive with shunting observed within 3-6 cardiac cycles suggestive of interatrial shunt.  LEFT VENTRICLE PLAX 2D LVIDd:         5.30 cm      Diastology LVIDs:         3.50 cm      LV e' lateral:   3.59 cm/s LV PW:         1.10 cm      LV E/e' lateral: 10.7 LV IVS:        1.10 cm LVOT diam:     2.40 cm LV SV:         171 LV SV Index:   84 LVOT Area:     4.52 cm  LV Volumes (MOD) LV vol d, MOD A2C: 185.0 ml LV vol d, MOD A4C: 187.0 ml LV vol s, MOD A2C: 71.8 ml LV vol s, MOD A4C: 71.8 ml LV SV MOD A2C:     113.2 ml LV SV MOD A4C:     187.0 ml LV SV MOD BP:      112.6 ml RIGHT VENTRICLE RV S prime:     14.70 cm/s TAPSE (M-mode): 1.6 cm LEFT ATRIUM             Index LA diam:        5.00 cm 2.47 cm/m LA Vol (  A2C):   44.3 ml 21.91 ml/m LA Vol (A4C):   64.6 ml 31.95 ml/m LA Biplane Vol: 55.3 ml 27.35 ml/m  AORTIC VALVE LVOT Vmax:   138.00 cm/s LVOT Vmean:  96.100 cm/s LVOT VTI:    0.377 m  AORTA Ao Root diam: 3.60 cm Ao Asc diam:  3.80 cm MITRAL VALVE MV Area (PHT): 3.60 cm    SHUNTS MV Decel Time: 211 msec    Systemic VTI:  0.38 m MV E velocity: 38.30 cm/s  Systemic Diam: 2.40 cm MV Elektra Wartman velocity: 66.60 cm/s MV E/Edilia Ghuman ratio:   0.58 Shelda Bruckner MD Electronically signed by Shelda Bruckner MD Signature Date/Time: 04/27/2024/8:17:27 PM    Final    MR BRAIN WO CONTRAST Result Date: 04/27/2024 EXAM: MRI BRAIN WITHOUT CONTRAST 04/27/2024 12:09:00 PM TECHNIQUE: Multiplanar multisequence MRI of the head/brain was performed without the administration of intravenous contrast. COMPARISON: Same day CT head and CTA head and neck. CLINICAL HISTORY: Stroke, follow up. FINDINGS: BRAIN AND VENTRICLES: There is Lyndal Alamillo 1.7 cm focus of restricted diffusion in the posterior aspect of the right corona radiata compatible with an acute infarct. Encephalomalacia and gliosis in the left superior frontal gyrus compatible with remote infarct. There are multiple additional remote infarcts in the cerebellum, right more than left. Additional remote cortical infarct in the right occipital lobe. T2 and FLAIR hyperintensity in the periventricular and subcortical white matter suggestive of mild to moderate chronic microvascular ischemic changes. Mild parietal predominant age related volume loss. Chronic microhemorrhage in the left temporal lobe. No acute intracranial hemorrhage. No mass. No midline shift. No hydrocephalus. The sella is unremarkable. Normal flow voids. ORBITS: Bilateral lens replacement. SINUSES AND MASTOIDS: No significant abnormality. BONES AND SOFT TISSUES: Normal marrow signal. No significant soft tissue abnormality. IMPRESSION: 1. Acute infarct in the posterior aspect of the right corona radiata. 2. Multiple remote infarcts including the left superior frontal gyrus, right occipital lobe, and cerebellum. 3. Mild to moderate chronic microvascular ischemic changes. Electronically signed by: Donnice Mania MD MD 04/27/2024 02:13 PM EST RP Workstation: [id number]   CT ANGIO HEAD NECK W WO CM W PERF (CODE STROKE) Addendum Date: 04/27/2024 ADDENDUM REPORT: 04/27/2024 10:40 ADDENDUM: Pelagia Iacobucci perfusion study was also obtained. The study is normal. No  perfusion deficit identified. Electronically Signed   By: Nancyann Burns M.D.   On: 04/27/2024 10:40   Result Date: 04/27/2024 CLINICAL DATA:  Left arm weakness EXAM: CT ANGIOGRAPHY HEAD AND NECK WITH CONTRAST TECHNIQUE: Multidetector CT imaging of the head and neck was performed using the standard protocol during bolus administration of intravenous contrast. Multiplanar CT image reconstructions and MIPs were obtained to evaluate the vascular anatomy. Carotid stenosis measurements (when applicable) are obtained utilizing NASCET criteria, using the distal internal carotid diameter as the denominator. RADIATION DOSE REDUCTION: This exam was performed according to the departmental dose-optimization program which includes automated exposure control, adjustment of the mA and/or kV according to patient size and/or use of iterative reconstruction technique. CONTRAST:  OMNIPAQUE  IOHEXOL  350 MG/ML SOLN COMPARISON:  None Available. CTA NECK: CTA NECK Aortic arch: No proximal vessel stenosis. Right carotid: Normal Left carotid: Normal Right vertebral: Normal Left vertebral: Normal Soft tissues: No significant abnormality Other comments: None CTA HEAD: CTA HEAD Right anterior circulation: The internal carotid artery is patent without significant stenosis. The anterior and middle cerebral arteries are patent without significant stenosis or proximal branch occlusion. No aneurysm. Left anterior circulation: The internal carotid artery is patent without significant stenosis. The anterior and middle  cerebral arteries are patent without significant stenosis or proximal branch occlusion. No aneurysm. Posterior circulation: Both vertebral arteries are patent. There is no significant basilar stenosis. Both posterior cerebral arteries are patent without significant stenosis or proximal branch occlusion. No aneurysm. IMPRESSION: No carotid stenosis on either side No significant vertebrobasilar or intracranial disease Electronically  Signed: By: Nancyann Burns M.D. On: 04/27/2024 09:57   CT HEAD CODE STROKE WO CONTRAST Result Date: 04/27/2024 EXAM: CT HEAD WITHOUT CONTRAST 04/27/2024 09:37:00 AM TECHNIQUE: CT of the head was performed without the administration of intravenous contrast. Automated exposure control, iterative reconstruction, and/or weight based adjustment of the mA/kV was utilized to reduce the radiation dose to as low as reasonably achievable. COMPARISON: Brain MRI 01/06/2006, Head CT 10/20/2005. CLINICAL HISTORY: 89 year old male with acute neurological deficit, stroke suspected. FINDINGS: BRAIN AND VENTRICLES: No acute hemorrhage. No evidence of acute infarct. No hydrocephalus. No extra-axial collection. No mass effect or midline shift. Chronic encephalomalacia anterior left superior frontal gyrus, new since 2007. Patchy and confluent but more age indeterminate hypodensity in the inferior right parietal lobe (coronal image 65). Small but circumscribed chronic appearing cerebellar infarcts. Mild for age patchy cerebral white matter hypodensity otherwise. No suspicious intracranial vascular hyperdensity. Calcified atherosclerosis at the skull base. ORBITS: Chronic left orbital floor fracture. SINUSES: Paranasal sinuses, tympanic cavities, and mastoids are well aerated. SOFT TISSUES AND SKULL: No acute soft tissue abnormality. No skull fracture. ALBERTA STROKE PROGRAM EARLY CT SCORE (ASPECTS): Ganglionic (caudate, IC, lentiform nucleus, insula, M1-M3): 7. Supraganglionic (M4-M6): 3. Total: 10. IMPRESSION: 1. Age indeterminate right PCA territory infarct. Otherwise, chronic findings (including left MCA and bilateral cerebellar artery territories). ASPECTS 10. 2. These results were communicated to Dr. JUDITHANN Raddle at [phone] hours on 04/27/2024 by text page via the [facility] messaging system. Electronically signed by: Helayne Hurst MD MD 04/27/2024 09:45 AM EST RP Workstation: [id number]        Scheduled Meds:  aspirin  EC  81 mg Oral  Daily   atorvastatin   40 mg Oral Daily   clopidogrel   75 mg Oral Daily   heparin   5,000 Units Subcutaneous Q8H   Continuous Infusions:   LOS: 1 day    Time spent: over 30 min     Meliton Monte, MD [facility]   To contact the attending provider between 7A-7P or the covering provider during after hours 7P-7A, please log into the web site [email or web address] and access using universal [employer] password for that web site. If you do not have the password, please call the hospital operator.  04/28/2024, 1:42 PM    "

## 2024-04-29 ENCOUNTER — Inpatient Hospital Stay (HOSPITAL_COMMUNITY)

## 2024-04-29 DIAGNOSIS — I639 Cerebral infarction, unspecified: Secondary | ICD-10-CM

## 2024-04-29 LAB — CBC WITH DIFFERENTIAL/PLATELET
Abs Immature Granulocytes: 0.02 K/uL (ref 0.00–0.07)
Basophils Absolute: 0 K/uL (ref 0.0–0.1)
Basophils Relative: 1 %
Eosinophils Absolute: 0.4 K/uL (ref 0.0–0.5)
Eosinophils Relative: 5 %
HCT: 37.1 % — ABNORMAL LOW (ref 39.0–52.0)
Hemoglobin: 12.8 g/dL — ABNORMAL LOW (ref 13.0–17.0)
Immature Granulocytes: 0 %
Lymphocytes Relative: 27 %
Lymphs Abs: 2 K/uL (ref 0.7–4.0)
MCH: 32.9 pg (ref 26.0–34.0)
MCHC: 34.5 g/dL (ref 30.0–36.0)
MCV: 95.4 fL (ref 80.0–100.0)
Monocytes Absolute: 0.7 K/uL (ref 0.1–1.0)
Monocytes Relative: 10 %
Neutro Abs: 4.1 K/uL (ref 1.7–7.7)
Neutrophils Relative %: 57 %
Platelets: 141 K/uL — ABNORMAL LOW (ref 150–400)
RBC: 3.89 MIL/uL — ABNORMAL LOW (ref 4.22–5.81)
RDW: 13.8 % (ref 11.5–15.5)
WBC: 7.2 K/uL (ref 4.0–10.5)
nRBC: 0 % (ref 0.0–0.2)

## 2024-04-29 LAB — COMPREHENSIVE METABOLIC PANEL WITH GFR
ALT: 31 U/L (ref 0–44)
AST: 24 U/L (ref 15–41)
Albumin: 3.3 g/dL — ABNORMAL LOW (ref 3.5–5.0)
Alkaline Phosphatase: 68 U/L (ref 38–126)
Anion gap: 7 (ref 5–15)
BUN: 22 mg/dL (ref 8–23)
CO2: 26 mmol/L (ref 22–32)
Calcium: 9.1 mg/dL (ref 8.9–10.3)
Chloride: 108 mmol/L (ref 98–111)
Creatinine, Ser: 0.91 mg/dL (ref 0.61–1.24)
GFR, Estimated: >60 mL/min
Glucose, Bld: 125 mg/dL — ABNORMAL HIGH (ref 70–99)
Potassium: 4.1 mmol/L (ref 3.5–5.1)
Sodium: 141 mmol/L (ref 135–145)
Total Bilirubin: 0.7 mg/dL (ref 0.0–1.2)
Total Protein: 5.4 g/dL — ABNORMAL LOW (ref 6.5–8.1)

## 2024-04-29 LAB — MAGNESIUM: Magnesium: 2 mg/dL (ref 1.7–2.4)

## 2024-04-29 LAB — PHOSPHORUS: Phosphorus: 3.3 mg/dL (ref 2.5–4.6)

## 2024-04-29 MED ORDER — POTASSIUM CHLORIDE CRYS ER 20 MEQ PO TBCR
20.0000 meq | EXTENDED_RELEASE_TABLET | Freq: Once | ORAL | Status: AC
  Administered 2024-04-29: 20 meq via ORAL
  Filled 2024-04-29: qty 1

## 2024-04-29 NOTE — Plan of Care (Signed)

## 2024-04-29 NOTE — Progress Notes (Signed)
 PT Cancellation Note  Patient Details Name: Robert Everett MRN: [id number] DOB: 02/16/1934   Cancelled Treatment:    Reason Eval/Treat Not Completed: Other (comment) (Pt asleep upon arrival. Acute PT to follow and re-attempt as able.)  Saleen Peden W, PT, DPT Secure Chat Preferred  Rehab Office [phone]  Kate BRAVO [name] 04/29/2024, 3:07 PM

## 2024-04-29 NOTE — Plan of Care (Signed)
  Problem: Education: Goal: Knowledge of disease or condition will improve Outcome: Progressing Goal: Knowledge of patient specific risk factors will improve (DELETE if not current risk factor) Outcome: Progressing   Problem: Coping: Goal: Will verbalize positive feelings about self Outcome: Progressing   Problem: Self-Care: Goal: Ability to participate in self-care as condition permits will improve Outcome: Progressing

## 2024-04-29 NOTE — Progress Notes (Signed)
" °   04/29/24 0100  Provider Notification  Provider Name/Title Dr Charlton  Date Provider Notified 04/29/24  Time Provider Notified 0040  Method of Notification Page  Notification Reason New onset of dysrhythmia  Type of New Onset of Dysrhythmia Second degree heart block (Mobitz II) (Call recieved from [facility] that patient has been switching heart rythmn between second degree type 1 and type 2. Vitals signs taken. EKG done.  Patient denies any symptoms)  Symptoms of New Onset of Dysrhythmia Other (Comment) (none)  Provider response See new orders  Date of Provider Response 04/29/24    "

## 2024-04-29 NOTE — Progress Notes (Incomplete)
 SABRA

## 2024-04-29 NOTE — H&P (View-Only) (Signed)
 Referring Physician: Meliton DELENA Perri Teddie MD  Robert Everett is an 89 y.o. male.                       Chief Complaint: Sinus dysrhythmia in patient with acute stroke  HPI: 89 years old white male with PMH of TIA, DVT, dyslipidemia, Arthritis has acute right parietal lobe infarct.  His VS are stable.  His blood work is also unremarkable except mild hyperglycemia and hypoproteinemia.  EKG has shown sinus rhythm with 1st degree AV block His echocardiogram showed normal LV systolic function with moderate to severe aortic valve insufficiency. Patient denies any h/o fever or endocarditis/Bacteremia. CT head and MRI are positive with right parietal infarct. Patient is on DAPT.  Past Medical History:  Diagnosis Date   Arthritis    hands & shoulder    Cancer (HCC)    basal cell- nose   Diverticulosis    DVT (deep venous thrombosis) (HCC) 2007   treated with coumadin- x1    H/O exercise stress test 2000?   done following what they thought was an TIA, told all was wnll   Prostate enlargement    takes saw palmetto    Pulmonary embolism (HCC) 2007   Seasonal allergies    uses Flonase nasal spray   Stroke [facility])    TIA- 1990's      Past Surgical History:  Procedure Laterality Date   INSERTION OF MESH Bilateral 06/28/2014   Procedure: INSERTION OF MESH;  Surgeon: Vicenta Poli, MD;  Location: [facility] OR;  Service: General;  Laterality: Bilateral;   LAPAROSCOPIC INGUINAL HERNIA WITH UMBILICAL HERNIA Bilateral 06/28/2014   Procedure: LAPAROSCOPIC RIGHT AND LEFT  INGUINAL HERNIA WITH UMBILICAL HERNIA  AND MESH;  Surgeon: Vicenta Poli, MD;  Location: [facility] OR;  Service: General;  Laterality: Bilateral;   polyps removed   2010   on colonoscopy   SHOULDER SURGERY Right    rotator cuff repair , x2 on the same shoulder   TONSILLECTOMY      History reviewed. No pertinent family history. Social History:  reports that he quit smoking about 53 years ago. His smoking use included cigarettes. He  has never used smokeless tobacco. He reports that he does not drink alcohol and does not use drugs.  Allergies: Allergies[1]  Medications Prior to Admission  Medication Sig Dispense Refill   aspirin  EC 81 MG tablet Take 81 mg by mouth at bedtime.      atorvastatin  (LIPITOR) 20 MG tablet Take 20 mg by mouth at bedtime.     diclofenac (VOLTAREN) 50 MG EC tablet Take 50 mg by mouth 2 (two) times daily.     Multiple Vitamin (MULTIVITAMIN PO) Take 1 tablet by mouth daily.     Saw Palmetto 450 MG CAPS Take 450 mg by mouth 2 (two) times daily.      Results for orders placed or performed during the hospital encounter of 04/27/24 (from the past 48 hours)  Lipid panel     Status: None   Collection Time: 04/28/24  1:48 AM  Result Value Ref Range   Cholesterol 124 0 - 200 mg/dL    Comment:        ATP III CLASSIFICATION:  <200     mg/dL   Desirable  799-760  mg/dL   Borderline High  >=759    mg/dL   High           Triglycerides 49 <150 mg/dL   HDL 42 >  40 mg/dL   Total CHOL/HDL Ratio 3.0 RATIO   VLDL 10 0 - 40 mg/dL   LDL Cholesterol 72 0 - 99 mg/dL    Comment:        Total Cholesterol/HDL:CHD Risk Coronary Heart Disease Risk Table                     Men   Women  1/2 Average Risk   3.4   3.3  Average Risk       5.0   4.4  2 X Average Risk   9.6   7.1  3 X Average Risk  23.4   11.0        Use the calculated Patient Ratio above and the CHD Risk Table to determine the patient's CHD Risk.        ATP III CLASSIFICATION (LDL):  <100     mg/dL   Optimal  899-870  mg/dL   Near or Above                    Optimal  130-159  mg/dL   Borderline  839-810  mg/dL   High  >809     mg/dL   Very High Performed at [facility], [address], [place], [place] [address]   CBC with Differential/Platelet     Status: Abnormal   Collection Time: 04/28/24  1:48 AM  Result Value Ref Range   WBC 7.6 4.0 - 10.5 K/uL   RBC 4.02 (L) 4.22 - 5.81 MIL/uL   Hemoglobin 13.0 13.0 - 17.0 g/dL   HCT  61.3 (L) 60.9 - 52.0 %   MCV 96.0 80.0 - 100.0 fL   MCH 32.3 26.0 - 34.0 pg   MCHC 33.7 30.0 - 36.0 g/dL   RDW 86.1 88.4 - 84.4 %   Platelets 134 (L) 150 - 400 K/uL   nRBC 0.0 0.0 - 0.2 %   Neutrophils Relative % 59 %   Neutro Abs 4.5 1.7 - 7.7 K/uL   Lymphocytes Relative 27 %   Lymphs Abs 2.1 0.7 - 4.0 K/uL   Monocytes Relative 8 %   Monocytes Absolute 0.6 0.1 - 1.0 K/uL   Eosinophils Relative 5 %   Eosinophils Absolute 0.3 0.0 - 0.5 K/uL   Basophils Relative 1 %   Basophils Absolute 0.1 0.0 - 0.1 K/uL   Immature Granulocytes 0 %   Abs Immature Granulocytes 0.02 0.00 - 0.07 K/uL    Comment: Performed at [facility], [address] [place], [place] [address]  Comprehensive metabolic panel with GFR     Status: Abnormal   Collection Time: 04/28/24  1:48 AM  Result Value Ref Range   Sodium 141 135 - 145 mmol/L   Potassium 3.7 3.5 - 5.1 mmol/L   Chloride 108 98 - 111 mmol/L   CO2 25 22 - 32 mmol/L   Glucose, Bld 108 (H) 70 - 99 mg/dL    Comment: Glucose reference range applies only to samples taken after fasting for at least 8 hours.   BUN 21 8 - 23 mg/dL   Creatinine, Ser 8.98 0.61 - 1.24 mg/dL   Calcium  8.8 (L) 8.9 - 10.3 mg/dL   Total Protein 5.4 (L) 6.5 - 8.1 g/dL   Albumin 3.3 (L) 3.5 - 5.0 g/dL   AST 20 15 - 41 U/L   ALT 24 0 - 44 U/L   Alkaline Phosphatase 67 38 - 126 U/L  Total Bilirubin 0.8 0.0 - 1.2 mg/dL   GFR, Estimated >39 >39 mL/min    Comment: (NOTE) Calculated using the CKD-EPI Creatinine Equation (2021)    Anion gap 8 5 - 15    Comment: Performed at [facility], [address], [place], [place] [address]  Magnesium      Status: None   Collection Time: 04/28/24  1:48 AM  Result Value Ref Range   Magnesium  2.0 1.7 - 2.4 mg/dL    Comment: Performed at [facility], [address] [place], [place] [address]  Phosphorus     Status: None   Collection Time: 04/28/24  1:48 AM  Result Value Ref Range   Phosphorus 3.3 2.5 - 4.6 mg/dL     Comment: Performed at [facility], [address], [place], [place] [address]  CBC with Differential/Platelet     Status: Abnormal   Collection Time: 04/29/24  2:01 AM  Result Value Ref Range   WBC 7.2 4.0 - 10.5 K/uL   RBC 3.89 (L) 4.22 - 5.81 MIL/uL   Hemoglobin 12.8 (L) 13.0 - 17.0 g/dL   HCT 62.8 (L) 60.9 - 47.9 %   MCV 95.4 80.0 - 100.0 fL   MCH 32.9 26.0 - 34.0 pg   MCHC 34.5 30.0 - 36.0 g/dL   RDW 86.1 88.4 - 84.4 %   Platelets 141 (L) 150 - 400 K/uL   nRBC 0.0 0.0 - 0.2 %   Neutrophils Relative % 57 %   Neutro Abs 4.1 1.7 - 7.7 K/uL   Lymphocytes Relative 27 %   Lymphs Abs 2.0 0.7 - 4.0 K/uL   Monocytes Relative 10 %   Monocytes Absolute 0.7 0.1 - 1.0 K/uL   Eosinophils Relative 5 %   Eosinophils Absolute 0.4 0.0 - 0.5 K/uL   Basophils Relative 1 %   Basophils Absolute 0.0 0.0 - 0.1 K/uL   Immature Granulocytes 0 %   Abs Immature Granulocytes 0.02 0.00 - 0.07 K/uL    Comment: Performed at [facility], [address], [place], [place] [address]  Comprehensive metabolic panel with GFR     Status: Abnormal   Collection Time: 04/29/24  2:01 AM  Result Value Ref Range   Sodium 141 135 - 145 mmol/L   Potassium 4.1 3.5 - 5.1 mmol/L   Chloride 108 98 - 111 mmol/L   CO2 26 22 - 32 mmol/L   Glucose, Bld 125 (H) 70 - 99 mg/dL    Comment: Glucose reference range applies only to samples taken after fasting for at least 8 hours.   BUN 22 8 - 23 mg/dL   Creatinine, Ser 9.08 0.61 - 1.24 mg/dL   Calcium  9.1 8.9 - 10.3 mg/dL   Total Protein 5.4 (L) 6.5 - 8.1 g/dL   Albumin 3.3 (L) 3.5 - 5.0 g/dL   AST 24 15 - 41 U/L   ALT 31 0 - 44 U/L   Alkaline Phosphatase 68 38 - 126 U/L   Total Bilirubin 0.7 0.0 - 1.2 mg/dL   GFR, Estimated >39 >39 mL/min    Comment: (NOTE) Calculated using the CKD-EPI Creatinine Equation (2021)    Anion gap 7 5 - 15    Comment: Performed at [facility], [address] [place], [place] [address]  Magnesium      Status: None    Collection Time: 04/29/24  2:01 AM  Result Value Ref Range   Magnesium  2.0 1.7 - 2.4 mg/dL  Comment: Performed at [facility], [address], [place], [place] [address]  Phosphorus     Status: None   Collection Time: 04/29/24  2:01 AM  Result Value Ref Range   Phosphorus 3.3 2.5 - 4.6 mg/dL    Comment: Performed at [facility], [address], [place], [place] [address]   CT HEAD WO CONTRAST ( ) Result Date: 04/29/2024 EXAM: CT HEAD WITHOUT CONTRAST 04/29/2024 06:30:45 PM TECHNIQUE: CT of the head was performed without the administration of intravenous contrast. Automated exposure control, iterative reconstruction, and/or weight based adjustment of the mA/kV was utilized to reduce the radiation dose to as low as reasonably achievable. COMPARISON: 04/27/2024 CLINICAL HISTORY: Mental status change, unknown cause. FINDINGS: BRAIN AND VENTRICLES: No acute hemorrhage. Evolving acute/subacute infarct in the right parietal lobe. Stable patchy low-attenuation in the supratentorial white matter. Stable encephalomalacia in the left frontal lobe. Enlarged ventricular system and sulcal spaces, proportionate, consistent with diffuse atrophy. No extra-axial collection. No mass effect or midline shift. ORBITS: Chronic left orbital floor fracture again visualized. SINUSES: No acute abnormality. SOFT TISSUES AND SKULL: No acute soft tissue abnormality. No skull fracture. IMPRESSION: 1. Evolving acute/subacute infarct in the right parietal lobe. 2. Stable patchy low-attenuation in the supratentorial white matter. 3. Stable encephalomalacia in the left frontal lobe. 4. Diffuse cerebral atrophy. Electronically signed by: Franky Stanford MD MD 04/29/2024 07:05 PM EST RP Workstation: [id number]   VAS US  LOWER EXTREMITY VENOUS (DVT) Result Date: 04/29/2024  Lower Venous DVT Study Patient Name:  Robert Everett  Date of Exam:   04/28/2024 Medical Rec #: [id number]        Accession #:    [id number] Date of Birth:  1933-08-08        Patient Gender: M Patient Age:   32 years Exam Location:  [facility] Procedure:      VAS US  LOWER EXTREMITY VENOUS (DVT) Referring Phys: DEVON SHAFER --------------------------------------------------------------------------------  Indications: Stroke. Other Indications: H/O TIA and DVT. Comparison Study: Previous study on 3.17.2016 Performing Technologist: Edilia Elden Appl  Examination Guidelines: A complete evaluation includes B-mode imaging, spectral Doppler, color Doppler, and power Doppler as needed of all accessible portions of each vessel. Bilateral testing is considered an integral part of a complete examination. Limited examinations for reoccurring indications may be performed as noted. The reflux portion of the exam is performed with the patient in reverse Trendelenburg.  +---------+---------------+---------+-----------+----------+--------------+ RIGHT    CompressibilityPhasicitySpontaneityPropertiesThrombus Aging +---------+---------------+---------+-----------+----------+--------------+ CFV      Full           Yes      Yes                                 +---------+---------------+---------+-----------+----------+--------------+ SFJ      Full           Yes      Yes                                 +---------+---------------+---------+-----------+----------+--------------+ FV Prox  Full                                                        +---------+---------------+---------+-----------+----------+--------------+ FV Mid  Full                                                        +---------+---------------+---------+-----------+----------+--------------+ FV DistalFull                                                        +---------+---------------+---------+-----------+----------+--------------+ PFV      Full                                                         +---------+---------------+---------+-----------+----------+--------------+ POP      Full           Yes      Yes                                 +---------+---------------+---------+-----------+----------+--------------+ PTV      Full                                                        +---------+---------------+---------+-----------+----------+--------------+ PERO     Full                                                        +---------+---------------+---------+-----------+----------+--------------+   +---------+---------------+---------+-----------+----------+--------------+ LEFT     CompressibilityPhasicitySpontaneityPropertiesThrombus Aging +---------+---------------+---------+-----------+----------+--------------+ CFV      Full           Yes      Yes                                 +---------+---------------+---------+-----------+----------+--------------+ SFJ      Full           Yes                                          +---------+---------------+---------+-----------+----------+--------------+ FV Prox  Full                                                        +---------+---------------+---------+-----------+----------+--------------+ FV Mid   Full                                                        +---------+---------------+---------+-----------+----------+--------------+  FV DistalFull                                                        +---------+---------------+---------+-----------+----------+--------------+ PFV      Full                                                        +---------+---------------+---------+-----------+----------+--------------+ POP      Partial        Yes      Yes                                 +---------+---------------+---------+-----------+----------+--------------+ PTV      Full                                                         +---------+---------------+---------+-----------+----------+--------------+ PERO     Full                                                        +---------+---------------+---------+-----------+----------+--------------+ Chronic deep vein thrombosis noted in the left popliteal vein at the distal portion.    Summary: RIGHT: - There is no evidence of deep vein thrombosis in the lower extremity.  - No cystic structure found in the popliteal fossa.  LEFT: - Findings consistent with chronic deep vein thrombosis involving the left popliteal vein.  - No cystic structure found in the popliteal fossa.  *See table(s) above for measurements and observations. Electronically signed by Fonda Rim on 04/29/2024 at 8:35:56 AM.    Final     Review Of Systems Constitutional: No fever, chills, weight loss or gain. Eyes: No vision change, wears glasses. No discharge or pain. Ears: Positive hearing loss, No tinnitus. Respiratory: No asthma, COPD, pneumonias. No shortness of breath. No hemoptysis. Cardiovascular: No chest pain, palpitation, leg edema. Gastrointestinal: No nausea, vomiting, diarrhea, constipation. No GI bleed. No hepatitis. Genitourinary: No dysuria, hematuria, kidney stone. No incontinance. Neurological: No headache, positive stroke, no seizures.  Psychiatry: No psych facility admission for anxiety, depression, suicide. No detox. Skin: No rash. Musculoskeletal: Positive joint pain, no fibromyalgia. No neck pain, back pain. Lymphadenopathy: No lymphadenopathy. Hematology: No anemia or easy bruising.   Blood pressure (!) 128/46, pulse 60, temperature 98.1 F (36.7 C), temperature source Oral, resp. rate 18, height 5' 9 (1.753 m), weight 86.2 kg, SpO2 94%. Body mass index is 28.06 kg/m. General appearance: alert, cooperative, appears stated age and no distress Head: Normocephalic, atraumatic. Eyes: Blue eyes, pink conjunctiva, corneas clear.  Neck: No adenopathy, no carotid bruit, no JVD,  supple, symmetrical, trachea midline and thyroid not enlarged. Resp: Clear to auscultation bilaterally. Cardio: Regular rate and rhythm, S1, S2 normal, III/VI systolic and II/VI diastolic murmur, no click, rub or gallop GI: Soft, non-tender;  bowel sounds normal; no organomegaly. Extremities: No edema, cyanosis or clubbing. Left sided weakness. Skin: Warm and dry.  Neurologic: Alert and oriented X 3 with left sided weakness.  Assessment/Plan Acute right parietal stroke H/O DVT H/O PE Moderate protein calorie malnutrition Sinus rhythm with 1st degree AV block. HTN Dyslipidemia  Plan: Continue DAPT. Consider TEE to r/o endocarditis of AV Blood cultures x 2.  Time spent: Review of old records, Lab, x-rays, EKG, other cardiac tests, examination, discussion with patient/Doctor/Nurse over 70 minutes.  Salena GORMAN Negri, MD  04/29/2024, 7:37 PM       [1] No Known Allergies

## 2024-04-29 NOTE — Progress Notes (Signed)
 " PROGRESS NOTE    Robert Everett  FMW:[id number] DOB: 1933-07-05 DOA: 04/27/2024 PCP: Merilee, L.Addie, MD (Inactive)  Chief Complaint  Patient presents with   Code Stroke    Brief Narrative:   89 yo with hx TIA, VTE, dyslipidemia who presented with L sided weakness and was found to have Robert Everett R corona radiata stroke.   Neurology is following, now on DAPT.   Plan is for CIR.  Assessment & Plan:   Principal Problem:   Acute CVA (cerebrovascular accident) (HCC) Active Problems:   Malnutrition of moderate degree  Acute CVA MRI with stroke in the posterior aspect of the R corona radiata.  Multiple remote infarcts including the L superior frontal gyrus, R occipital lobe, cerebellum. CTA head/neck without carotid stenosis, no significant vertebrobasilar or intracranial disease Echo with preserved EF, no RWMA, positive bubble study suggestive of interatrial shunt LE US  with chronic DVT LDL 72, A1c 5.8 Appreciate neurology recommendations - cryptogenic strokes - recommending DAPT for 3 weeks then plavix  alone.  Lipitor increased to 40 mg.  Eccentric Aortic Regurgitation Likely moderate to severe Will defer additional workup to outpatient setting  Hypertension  Permissive hypertension, not on any home BP meds  Dyslipidemia Lipitor increased as above  Prediabetes A1c 5.8     DVT prophylaxis: heparin  Code Status: full Family Communication: none Disposition:   Status is: Inpatient Remains inpatient appropriate because: need for continued neurology follow up   Consultants:  neurology  Procedures:  Echo IMPRESSIONS     1. Left ventricular ejection fraction, by estimation, is 60 to 65%. The  left ventricle has normal function. The left ventricle has no regional  wall motion abnormalities. Left ventricular diastolic parameters are  consistent with Grade I diastolic  dysfunction (impaired relaxation).   2. Right ventricular systolic function is normal. The right  ventricular  size is normal.   3. Left atrial size was mildly dilated.   4. The mitral valve is normal in structure. Trivial mitral valve  regurgitation. No evidence of mitral stenosis.   5. The aortic valve is tricuspid. There is mild calcification of the  aortic valve. Aortic valve regurgitation is moderate to severe. Aortic  valve sclerosis is present, with no evidence of aortic valve stenosis.   6. Aortic dilatation noted. There is borderline dilatation of the  ascending aorta, measuring 38 mm.   7. Agitated saline contrast bubble study was positive with shunting  observed within 3-6 cardiac cycles suggestive of interatrial shunt.   Comparison(s): No prior Echocardiogram.   Conclusion(s)/Recommendation(s): Very eccentric aortic regurgitation,  likely moderate to severe. Could consider TEE or cardiac MRI for further  evaluation. Bubble study positive for intraatrial shunt.   Antimicrobials:  Anti-infectives (From admission, onward)    None       Subjective:  No new complaints  Objective: Vitals:   04/29/24 0315 04/29/24 0738 04/29/24 1120 04/29/24 1521  BP: (!) 140/54 (!) 141/39 (!) 126/46 (!) 128/46  Pulse: 66 (!) 57 63 60  Resp:  20 (!) 21 18  Temp: 98.1 F (36.7 C) 97.7 F (36.5 C) 97.7 F (36.5 C) 98.1 F (36.7 C)  TempSrc: Oral Oral Oral Oral  SpO2: 93% 94% 94% 94%  Weight:      Height:        Intake/Output Summary (Last 24 hours) at 04/29/2024 1635 Last data filed at 04/29/2024 1400 Gross per 24 hour  Intake 360 ml  Output 750 ml  Net -390 ml   [employer]  04/27/24 0934  Weight: 86.2 kg    Examination:  General: No acute distress. Cardiovascular: RRR Lungs: unlabored Neurological: L sided weakness (flaccid in LUE, antigravity in LLE) Extremities: No clubbing or cyanosis. No edema.  Data Reviewed: I have personally reviewed following labs and imaging studies  CBC: Recent Labs  Lab 04/27/24 0930 04/27/24 0938 04/28/24 0148  04/29/24 0201  WBC 6.0  --  7.6 7.2  NEUTROABS 3.5  --  4.5 4.1  HGB 14.3 14.3 13.0 12.8*  HCT 42.3 42.0 38.6* 37.1*  MCV 96.8  --  96.0 95.4  PLT 154  --  134* 141*    Basic Metabolic Panel: Recent Labs  Lab 04/27/24 0930 04/27/24 0938 04/28/24 0148 04/29/24 0201  NA 142 144 141 141  K 3.8 3.8 3.7 4.1  CL 109 107 108 108  CO2 24  --  25 26  GLUCOSE 112* 112* 108* 125*  BUN 20 23 21 22   CREATININE 0.96 1.00 1.01 0.91  CALCIUM  9.2  --  8.8* 9.1  MG  --   --  2.0 2.0  PHOS  --   --  3.3 3.3    GFR: Estimated Creatinine Clearance: 61 mL/min (by C-G formula based on SCr of 0.91 mg/dL).  Liver Function Tests: Recent Labs  Lab 04/27/24 0930 04/28/24 0148 04/29/24 0201  AST 26  25 20 24   ALT 30  30 24 31   ALKPHOS 80  81 67 68  BILITOT 1.0  1.0 0.8 0.7  PROT 6.4*  6.4* 5.4* 5.4*  ALBUMIN 3.9  3.9 3.3* 3.3*    CBG: Recent Labs  Lab 04/27/24 0930  GLUCAP 104*     No results found for this or any previous visit (from the past 240 hours).       Radiology Studies: VAS US  LOWER EXTREMITY VENOUS (DVT) Result Date: 04/29/2024  Lower Venous DVT Study Patient Name:  Robert Everett  Date of Exam:   04/28/2024 Medical Rec #: [id number]        Accession #:    [id number] Date of Birth: [birth year]        Patient Gender: M Patient Age:   42 years Exam Location:  [facility] Procedure:      VAS US  LOWER EXTREMITY VENOUS (DVT) Referring Phys: Robert Everett --------------------------------------------------------------------------------  Indications: Stroke. Other Indications: H/O TIA and DVT. Comparison Study: Previous study on 3.17.2016 Performing Technologist: Robert Everett  Examination Guidelines: Robert Everett complete evaluation includes B-mode imaging, spectral Doppler, color Doppler, and power Doppler as needed of all accessible portions of each vessel. Bilateral testing is considered an integral part of Robert Everett complete examination. Limited examinations for reoccurring  indications may be performed as noted. The reflux portion of the exam is performed with the patient in reverse Trendelenburg.  +---------+---------------+---------+-----------+----------+--------------+ RIGHT    CompressibilityPhasicitySpontaneityPropertiesThrombus Aging +---------+---------------+---------+-----------+----------+--------------+ CFV      Full           Yes      Yes                                 +---------+---------------+---------+-----------+----------+--------------+ SFJ      Full           Yes      Yes                                 +---------+---------------+---------+-----------+----------+--------------+  FV Prox  Full                                                        +---------+---------------+---------+-----------+----------+--------------+ FV Mid   Full                                                        +---------+---------------+---------+-----------+----------+--------------+ FV DistalFull                                                        +---------+---------------+---------+-----------+----------+--------------+ PFV      Full                                                        +---------+---------------+---------+-----------+----------+--------------+ POP      Full           Yes      Yes                                 +---------+---------------+---------+-----------+----------+--------------+ PTV      Full                                                        +---------+---------------+---------+-----------+----------+--------------+ PERO     Full                                                        +---------+---------------+---------+-----------+----------+--------------+   +---------+---------------+---------+-----------+----------+--------------+ LEFT     CompressibilityPhasicitySpontaneityPropertiesThrombus Aging  +---------+---------------+---------+-----------+----------+--------------+ CFV      Full           Yes      Yes                                 +---------+---------------+---------+-----------+----------+--------------+ SFJ      Full           Yes                                          +---------+---------------+---------+-----------+----------+--------------+ FV Prox  Full                                                        +---------+---------------+---------+-----------+----------+--------------+  FV Mid   Full                                                        +---------+---------------+---------+-----------+----------+--------------+ FV DistalFull                                                        +---------+---------------+---------+-----------+----------+--------------+ PFV      Full                                                        +---------+---------------+---------+-----------+----------+--------------+ POP      Partial        Yes      Yes                                 +---------+---------------+---------+-----------+----------+--------------+ PTV      Full                                                        +---------+---------------+---------+-----------+----------+--------------+ PERO     Full                                                        +---------+---------------+---------+-----------+----------+--------------+ Chronic deep vein thrombosis noted in the left popliteal vein at the distal portion.    Summary: RIGHT: - There is no evidence of deep vein thrombosis in the lower extremity.  - No cystic structure found in the popliteal fossa.  LEFT: - Findings consistent with chronic deep vein thrombosis involving the left popliteal vein.  - No cystic structure found in the popliteal fossa.  *See table(s) above for measurements and observations. Electronically signed by Fonda Rim on 04/29/2024 at 8:35:56 AM.     Final         Scheduled Meds:  aspirin  EC  81 mg Oral Daily   atorvastatin   40 mg Oral Daily   clopidogrel   75 mg Oral Daily   feeding supplement  237 mL Oral BID BM   heparin   5,000 Units Subcutaneous Q8H   multivitamin with minerals  1 tablet Oral Daily   Continuous Infusions:   LOS: 2 days    Time spent: over 30 min     Meliton Monte, MD [facility]   To contact the attending provider between 7A-7P or the covering provider during after hours 7P-7A, please log into the web site [email or web address] and access using universal [place] password for that web site. If you do not have the password, please call the hospital operator.  04/29/2024, 4:35 PM    "

## 2024-04-29 NOTE — Consult Note (Signed)
 Referring Physician: Meliton DELENA Perri Teddie MD  Robert Everett is an 89 y.o. male.                       Chief Complaint: Sinus dysrhythmia in patient with acute stroke  HPI: 89 years old white male with PMH of TIA, DVT, dyslipidemia, Arthritis has acute right parietal lobe infarct.  His VS are stable.  His blood work is also unremarkable except mild hyperglycemia and hypoproteinemia.  EKG has shown sinus rhythm with 1st degree AV block His echocardiogram showed normal LV systolic function with moderate to severe aortic valve insufficiency. Patient denies any h/o fever or endocarditis/Bacteremia. CT head and MRI are positive with right parietal infarct. Patient is on DAPT.  Past Medical History:  Diagnosis Date   Arthritis    hands & shoulder    Cancer (HCC)    basal cell- nose   Diverticulosis    DVT (deep venous thrombosis) (HCC) 2007   treated with coumadin- x1    H/O exercise stress test 2000?   done following what they thought was an TIA, told all was wnll   Prostate enlargement    takes saw palmetto    Pulmonary embolism (HCC) 2007   Seasonal allergies    uses Flonase nasal spray   Stroke [facility])    TIA- 1990's      Past Surgical History:  Procedure Laterality Date   INSERTION OF MESH Bilateral 06/28/2014   Procedure: INSERTION OF MESH;  Surgeon: Vicenta Poli, MD;  Location: [facility] OR;  Service: General;  Laterality: Bilateral;   LAPAROSCOPIC INGUINAL HERNIA WITH UMBILICAL HERNIA Bilateral 06/28/2014   Procedure: LAPAROSCOPIC RIGHT AND LEFT  INGUINAL HERNIA WITH UMBILICAL HERNIA  AND MESH;  Surgeon: Vicenta Poli, MD;  Location: [facility] OR;  Service: General;  Laterality: Bilateral;   polyps removed   2010   on colonoscopy   SHOULDER SURGERY Right    rotator cuff repair , x2 on the same shoulder   TONSILLECTOMY      History reviewed. No pertinent family history. Social History:  reports that he quit smoking about 53 years ago. His smoking use included cigarettes. He  has never used smokeless tobacco. He reports that he does not drink alcohol and does not use drugs.  Allergies: Allergies[1]  Medications Prior to Admission  Medication Sig Dispense Refill   aspirin  EC 81 MG tablet Take 81 mg by mouth at bedtime.      atorvastatin  (LIPITOR) 20 MG tablet Take 20 mg by mouth at bedtime.     diclofenac (VOLTAREN) 50 MG EC tablet Take 50 mg by mouth 2 (two) times daily.     Multiple Vitamin (MULTIVITAMIN PO) Take 1 tablet by mouth daily.     Saw Palmetto 450 MG CAPS Take 450 mg by mouth 2 (two) times daily.      Results for orders placed or performed during the hospital encounter of 04/27/24 (from the past 48 hours)  Lipid panel     Status: None   Collection Time: 04/28/24  1:48 AM  Result Value Ref Range   Cholesterol 124 0 - 200 mg/dL    Comment:        ATP III CLASSIFICATION:  <200     mg/dL   Desirable  799-760  mg/dL   Borderline High  >=759    mg/dL   High           Triglycerides 49 <150 mg/dL   HDL 42 >  40 mg/dL   Total CHOL/HDL Ratio 3.0 RATIO   VLDL 10 0 - 40 mg/dL   LDL Cholesterol 72 0 - 99 mg/dL    Comment:        Total Cholesterol/HDL:CHD Risk Coronary Heart Disease Risk Table                     Men   Women  1/2 Average Risk   3.4   3.3  Average Risk       5.0   4.4  2 X Average Risk   9.6   7.1  3 X Average Risk  23.4   11.0        Use the calculated Patient Ratio above and the CHD Risk Table to determine the patient's CHD Risk.        ATP III CLASSIFICATION (LDL):  <100     mg/dL   Optimal  899-870  mg/dL   Near or Above                    Optimal  130-159  mg/dL   Borderline  839-810  mg/dL   High  >809     mg/dL   Very High Performed at [facility], [address], [place], [place] [address]   CBC with Differential/Platelet     Status: Abnormal   Collection Time: 04/28/24  1:48 AM  Result Value Ref Range   WBC 7.6 4.0 - 10.5 K/uL   RBC 4.02 (L) 4.22 - 5.81 MIL/uL   Hemoglobin 13.0 13.0 - 17.0 g/dL   HCT  61.3 (L) 60.9 - 52.0 %   MCV 96.0 80.0 - 100.0 fL   MCH 32.3 26.0 - 34.0 pg   MCHC 33.7 30.0 - 36.0 g/dL   RDW 86.1 88.4 - 84.4 %   Platelets 134 (L) 150 - 400 K/uL   nRBC 0.0 0.0 - 0.2 %   Neutrophils Relative % 59 %   Neutro Abs 4.5 1.7 - 7.7 K/uL   Lymphocytes Relative 27 %   Lymphs Abs 2.1 0.7 - 4.0 K/uL   Monocytes Relative 8 %   Monocytes Absolute 0.6 0.1 - 1.0 K/uL   Eosinophils Relative 5 %   Eosinophils Absolute 0.3 0.0 - 0.5 K/uL   Basophils Relative 1 %   Basophils Absolute 0.1 0.0 - 0.1 K/uL   Immature Granulocytes 0 %   Abs Immature Granulocytes 0.02 0.00 - 0.07 K/uL    Comment: Performed at [facility], [address] [place], [place] [address]  Comprehensive metabolic panel with GFR     Status: Abnormal   Collection Time: 04/28/24  1:48 AM  Result Value Ref Range   Sodium 141 135 - 145 mmol/L   Potassium 3.7 3.5 - 5.1 mmol/L   Chloride 108 98 - 111 mmol/L   CO2 25 22 - 32 mmol/L   Glucose, Bld 108 (H) 70 - 99 mg/dL    Comment: Glucose reference range applies only to samples taken after fasting for at least 8 hours.   BUN 21 8 - 23 mg/dL   Creatinine, Ser 8.98 0.61 - 1.24 mg/dL   Calcium  8.8 (L) 8.9 - 10.3 mg/dL   Total Protein 5.4 (L) 6.5 - 8.1 g/dL   Albumin 3.3 (L) 3.5 - 5.0 g/dL   AST 20 15 - 41 U/L   ALT 24 0 - 44 U/L   Alkaline Phosphatase 67 38 - 126 U/L  Total Bilirubin 0.8 0.0 - 1.2 mg/dL   GFR, Estimated >39 >39 mL/min    Comment: (NOTE) Calculated using the CKD-EPI Creatinine Equation (2021)    Anion gap 8 5 - 15    Comment: Performed at [facility], [address], [place], [place] [address]  Magnesium      Status: None   Collection Time: 04/28/24  1:48 AM  Result Value Ref Range   Magnesium  2.0 1.7 - 2.4 mg/dL    Comment: Performed at [facility], [address] [place], [place] [address]  Phosphorus     Status: None   Collection Time: 04/28/24  1:48 AM  Result Value Ref Range   Phosphorus 3.3 2.5 - 4.6 mg/dL     Comment: Performed at [facility], [address], [place], [place] [address]  CBC with Differential/Platelet     Status: Abnormal   Collection Time: 04/29/24  2:01 AM  Result Value Ref Range   WBC 7.2 4.0 - 10.5 K/uL   RBC 3.89 (L) 4.22 - 5.81 MIL/uL   Hemoglobin 12.8 (L) 13.0 - 17.0 g/dL   HCT 62.8 (L) 60.9 - 47.9 %   MCV 95.4 80.0 - 100.0 fL   MCH 32.9 26.0 - 34.0 pg   MCHC 34.5 30.0 - 36.0 g/dL   RDW 86.1 88.4 - 84.4 %   Platelets 141 (L) 150 - 400 K/uL   nRBC 0.0 0.0 - 0.2 %   Neutrophils Relative % 57 %   Neutro Abs 4.1 1.7 - 7.7 K/uL   Lymphocytes Relative 27 %   Lymphs Abs 2.0 0.7 - 4.0 K/uL   Monocytes Relative 10 %   Monocytes Absolute 0.7 0.1 - 1.0 K/uL   Eosinophils Relative 5 %   Eosinophils Absolute 0.4 0.0 - 0.5 K/uL   Basophils Relative 1 %   Basophils Absolute 0.0 0.0 - 0.1 K/uL   Immature Granulocytes 0 %   Abs Immature Granulocytes 0.02 0.00 - 0.07 K/uL    Comment: Performed at [facility], [address], [place], [place] [address]  Comprehensive metabolic panel with GFR     Status: Abnormal   Collection Time: 04/29/24  2:01 AM  Result Value Ref Range   Sodium 141 135 - 145 mmol/L   Potassium 4.1 3.5 - 5.1 mmol/L   Chloride 108 98 - 111 mmol/L   CO2 26 22 - 32 mmol/L   Glucose, Bld 125 (H) 70 - 99 mg/dL    Comment: Glucose reference range applies only to samples taken after fasting for at least 8 hours.   BUN 22 8 - 23 mg/dL   Creatinine, Ser 9.08 0.61 - 1.24 mg/dL   Calcium  9.1 8.9 - 10.3 mg/dL   Total Protein 5.4 (L) 6.5 - 8.1 g/dL   Albumin 3.3 (L) 3.5 - 5.0 g/dL   AST 24 15 - 41 U/L   ALT 31 0 - 44 U/L   Alkaline Phosphatase 68 38 - 126 U/L   Total Bilirubin 0.7 0.0 - 1.2 mg/dL   GFR, Estimated >39 >39 mL/min    Comment: (NOTE) Calculated using the CKD-EPI Creatinine Equation (2021)    Anion gap 7 5 - 15    Comment: Performed at [facility], [address] [place], [place] [address]  Magnesium      Status: None    Collection Time: 04/29/24  2:01 AM  Result Value Ref Range   Magnesium  2.0 1.7 - 2.4 mg/dL  Comment: Performed at [facility], [address], [place], [place] [address]  Phosphorus     Status: None   Collection Time: 04/29/24  2:01 AM  Result Value Ref Range   Phosphorus 3.3 2.5 - 4.6 mg/dL    Comment: Performed at [facility], [address], [place], [place] [address]   CT HEAD WO CONTRAST ( ) Result Date: 04/29/2024 EXAM: CT HEAD WITHOUT CONTRAST 04/29/2024 06:30:45 PM TECHNIQUE: CT of the head was performed without the administration of intravenous contrast. Automated exposure control, iterative reconstruction, and/or weight based adjustment of the mA/kV was utilized to reduce the radiation dose to as low as reasonably achievable. COMPARISON: 04/27/2024 CLINICAL HISTORY: Mental status change, unknown cause. FINDINGS: BRAIN AND VENTRICLES: No acute hemorrhage. Evolving acute/subacute infarct in the right parietal lobe. Stable patchy low-attenuation in the supratentorial white matter. Stable encephalomalacia in the left frontal lobe. Enlarged ventricular system and sulcal spaces, proportionate, consistent with diffuse atrophy. No extra-axial collection. No mass effect or midline shift. ORBITS: Chronic left orbital floor fracture again visualized. SINUSES: No acute abnormality. SOFT TISSUES AND SKULL: No acute soft tissue abnormality. No skull fracture. IMPRESSION: 1. Evolving acute/subacute infarct in the right parietal lobe. 2. Stable patchy low-attenuation in the supratentorial white matter. 3. Stable encephalomalacia in the left frontal lobe. 4. Diffuse cerebral atrophy. Electronically signed by: Franky Stanford MD MD 04/29/2024 07:05 PM EST RP Workstation: [id number]   VAS US  LOWER EXTREMITY VENOUS (DVT) Result Date: 04/29/2024  Lower Venous DVT Study Patient Name:  Robert Everett  Date of Exam:   04/28/2024 Medical Rec #: [id number]        Accession #:    [id number] Date of Birth:  1933-08-08        Patient Gender: M Patient Age:   32 years Exam Location:  [facility] Procedure:      VAS US  LOWER EXTREMITY VENOUS (DVT) Referring Phys: DEVON SHAFER --------------------------------------------------------------------------------  Indications: Stroke. Other Indications: H/O TIA and DVT. Comparison Study: Previous study on 3.17.2016 Performing Technologist: Edilia Elden Appl  Examination Guidelines: A complete evaluation includes B-mode imaging, spectral Doppler, color Doppler, and power Doppler as needed of all accessible portions of each vessel. Bilateral testing is considered an integral part of a complete examination. Limited examinations for reoccurring indications may be performed as noted. The reflux portion of the exam is performed with the patient in reverse Trendelenburg.  +---------+---------------+---------+-----------+----------+--------------+ RIGHT    CompressibilityPhasicitySpontaneityPropertiesThrombus Aging +---------+---------------+---------+-----------+----------+--------------+ CFV      Full           Yes      Yes                                 +---------+---------------+---------+-----------+----------+--------------+ SFJ      Full           Yes      Yes                                 +---------+---------------+---------+-----------+----------+--------------+ FV Prox  Full                                                        +---------+---------------+---------+-----------+----------+--------------+ FV Mid  Full                                                        +---------+---------------+---------+-----------+----------+--------------+ FV DistalFull                                                        +---------+---------------+---------+-----------+----------+--------------+ PFV      Full                                                         +---------+---------------+---------+-----------+----------+--------------+ POP      Full           Yes      Yes                                 +---------+---------------+---------+-----------+----------+--------------+ PTV      Full                                                        +---------+---------------+---------+-----------+----------+--------------+ PERO     Full                                                        +---------+---------------+---------+-----------+----------+--------------+   +---------+---------------+---------+-----------+----------+--------------+ LEFT     CompressibilityPhasicitySpontaneityPropertiesThrombus Aging +---------+---------------+---------+-----------+----------+--------------+ CFV      Full           Yes      Yes                                 +---------+---------------+---------+-----------+----------+--------------+ SFJ      Full           Yes                                          +---------+---------------+---------+-----------+----------+--------------+ FV Prox  Full                                                        +---------+---------------+---------+-----------+----------+--------------+ FV Mid   Full                                                        +---------+---------------+---------+-----------+----------+--------------+  FV DistalFull                                                        +---------+---------------+---------+-----------+----------+--------------+ PFV      Full                                                        +---------+---------------+---------+-----------+----------+--------------+ POP      Partial        Yes      Yes                                 +---------+---------------+---------+-----------+----------+--------------+ PTV      Full                                                         +---------+---------------+---------+-----------+----------+--------------+ PERO     Full                                                        +---------+---------------+---------+-----------+----------+--------------+ Chronic deep vein thrombosis noted in the left popliteal vein at the distal portion.    Summary: RIGHT: - There is no evidence of deep vein thrombosis in the lower extremity.  - No cystic structure found in the popliteal fossa.  LEFT: - Findings consistent with chronic deep vein thrombosis involving the left popliteal vein.  - No cystic structure found in the popliteal fossa.  *See table(s) above for measurements and observations. Electronically signed by Fonda Rim on 04/29/2024 at 8:35:56 AM.    Final     Review Of Systems Constitutional: No fever, chills, weight loss or gain. Eyes: No vision change, wears glasses. No discharge or pain. Ears: Positive hearing loss, No tinnitus. Respiratory: No asthma, COPD, pneumonias. No shortness of breath. No hemoptysis. Cardiovascular: No chest pain, palpitation, leg edema. Gastrointestinal: No nausea, vomiting, diarrhea, constipation. No GI bleed. No hepatitis. Genitourinary: No dysuria, hematuria, kidney stone. No incontinance. Neurological: No headache, positive stroke, no seizures.  Psychiatry: No psych facility admission for anxiety, depression, suicide. No detox. Skin: No rash. Musculoskeletal: Positive joint pain, no fibromyalgia. No neck pain, back pain. Lymphadenopathy: No lymphadenopathy. Hematology: No anemia or easy bruising.   Blood pressure (!) 128/46, pulse 60, temperature 98.1 F (36.7 C), temperature source Oral, resp. rate 18, height 5' 9 (1.753 m), weight 86.2 kg, SpO2 94%. Body mass index is 28.06 kg/m. General appearance: alert, cooperative, appears stated age and no distress Head: Normocephalic, atraumatic. Eyes: Blue eyes, pink conjunctiva, corneas clear.  Neck: No adenopathy, no carotid bruit, no JVD,  supple, symmetrical, trachea midline and thyroid not enlarged. Resp: Clear to auscultation bilaterally. Cardio: Regular rate and rhythm, S1, S2 normal, III/VI systolic and II/VI diastolic murmur, no click, rub or gallop GI: Soft, non-tender;  bowel sounds normal; no organomegaly. Extremities: No edema, cyanosis or clubbing. Left sided weakness. Skin: Warm and dry.  Neurologic: Alert and oriented X 3 with left sided weakness.  Assessment/Plan Acute right parietal stroke H/O DVT H/O PE Moderate protein calorie malnutrition Sinus rhythm with 1st degree AV block. HTN Dyslipidemia  Plan: Continue DAPT. Consider TEE to r/o endocarditis of AV Blood cultures x 2.  Time spent: Review of old records, Lab, x-rays, EKG, other cardiac tests, examination, discussion with patient/Doctor/Nurse over 70 minutes.  Salena GORMAN Negri, MD  04/29/2024, 7:37 PM       [1] No Known Allergies

## 2024-04-29 NOTE — Plan of Care (Signed)
 " Problem: Education: Goal: Knowledge of disease or condition will improve 04/29/2024 1736 by Oneita Rhoda Aquas, RN Outcome: Progressing 04/29/2024 1651 by Oneita Rhoda Aquas, RN Outcome: Progressing Goal: Knowledge of secondary prevention will improve (MUST DOCUMENT ALL) 04/29/2024 1736 by Oneita Rhoda Aquas, RN Outcome: Progressing 04/29/2024 1651 by Oneita Rhoda Aquas, RN Outcome: Progressing Goal: Knowledge of patient specific risk factors will improve (DELETE if not current risk factor) 04/29/2024 1736 by Oneita Rhoda Aquas, RN Outcome: Progressing 04/29/2024 1651 by Oneita Rhoda Aquas, RN Outcome: Progressing   Problem: Ischemic Stroke/TIA Tissue Perfusion: Goal: Complications of ischemic stroke/TIA will be minimized 04/29/2024 1736 by Oneita Rhoda Aquas, RN Outcome: Progressing 04/29/2024 1651 by Oneita Rhoda Aquas, RN Outcome: Progressing   Problem: Coping: Goal: Will verbalize positive feelings about self 04/29/2024 1736 by Oneita Rhoda Aquas, RN Outcome: Progressing 04/29/2024 1651 by Oneita Rhoda Aquas, RN Outcome: Progressing Goal: Will identify appropriate support needs 04/29/2024 1736 by Oneita Rhoda Aquas, RN Outcome: Progressing 04/29/2024 1651 by Oneita Rhoda Aquas, RN Outcome: Progressing   Problem: Health Behavior/Discharge Planning: Goal: Ability to manage health-related needs will improve 04/29/2024 1736 by Oneita Rhoda Aquas, RN Outcome: Progressing 04/29/2024 1651 by Oneita Rhoda Aquas, RN Outcome: Progressing Goal: Goals will be collaboratively established with patient/family 04/29/2024 1736 by Oneita Rhoda Aquas, RN Outcome: Progressing 04/29/2024 1651 by Oneita Rhoda Aquas, RN Outcome: Progressing   Problem: Self-Care: Goal: Ability to participate in self-care as condition permits will improve 04/29/2024 1736 by Oneita Rhoda Aquas, RN Outcome:  Progressing 04/29/2024 1651 by Oneita Rhoda Aquas, RN Outcome: Progressing Goal: Verbalization of feelings and concerns over difficulty with self-care will improve 04/29/2024 1736 by Oneita Rhoda Aquas, RN Outcome: Progressing 04/29/2024 1651 by Oneita Rhoda Aquas, RN Outcome: Progressing Goal: Ability to communicate needs accurately will improve 04/29/2024 1736 by Oneita Rhoda Aquas, RN Outcome: Progressing 04/29/2024 1651 by Oneita Rhoda Aquas, RN Outcome: Progressing   Problem: Nutrition: Goal: Risk of aspiration will decrease 04/29/2024 1736 by Oneita Rhoda Aquas, RN Outcome: Progressing 04/29/2024 1651 by Oneita Rhoda Aquas, RN Outcome: Progressing Goal: Dietary intake will improve 04/29/2024 1736 by Oneita Rhoda Aquas, RN Outcome: Progressing 04/29/2024 1651 by Oneita Rhoda Aquas, RN Outcome: Progressing   Problem: Education: Goal: Knowledge of General Education information will improve Description: Including pain rating scale, medication(s)/side effects and non-pharmacologic comfort measures 04/29/2024 1736 by Oneita Rhoda Aquas, RN Outcome: Progressing 04/29/2024 1651 by Oneita Rhoda Aquas, RN Outcome: Progressing   Problem: Health Behavior/Discharge Planning: Goal: Ability to manage health-related needs will improve 04/29/2024 1736 by Oneita Rhoda Aquas, RN Outcome: Progressing 04/29/2024 1651 by Oneita Rhoda Aquas, RN Outcome: Progressing   Problem: Clinical Measurements: Goal: Ability to maintain clinical measurements within normal limits will improve 04/29/2024 1736 by Oneita Rhoda Aquas, RN Outcome: Progressing 04/29/2024 1651 by Oneita Rhoda Aquas, RN Outcome: Progressing Goal: Will remain free from infection 04/29/2024 1736 by Oneita Rhoda Aquas, RN Outcome: Progressing 04/29/2024 1651 by Oneita Rhoda Aquas, RN Outcome: Progressing Goal: Diagnostic test results will  improve 04/29/2024 1736 by Oneita Rhoda Aquas, RN Outcome: Progressing 04/29/2024 1651 by Oneita Rhoda Aquas, RN Outcome: Progressing Goal: Respiratory complications will improve 04/29/2024 1736 by Oneita Rhoda Aquas, RN Outcome: Progressing 04/29/2024 1651 by Oneita Rhoda Aquas, RN Outcome: Progressing Goal: Cardiovascular complication will be avoided 04/29/2024 1736 by Oneita Rhoda Aquas, RN Outcome: Progressing 04/29/2024 1651 by Oneita Rhoda Aquas, RN Outcome: Progressing   Problem: Activity: Goal: Risk for activity intolerance will decrease 04/29/2024 1736 by Oneita Rhoda Aquas, RN Outcome: Progressing 04/29/2024 1651  by Oneita Rhoda Aquas, RN Outcome: Progressing   Problem: Nutrition: Goal: Adequate nutrition will be maintained 04/29/2024 1736 by Oneita Rhoda Aquas, RN Outcome: Progressing 04/29/2024 1651 by Oneita Rhoda Aquas, RN Outcome: Progressing   Problem: Coping: Goal: Level of anxiety will decrease 04/29/2024 1736 by Oneita Rhoda Aquas, RN Outcome: Progressing 04/29/2024 1651 by Oneita Rhoda Aquas, RN Outcome: Progressing   Problem: Elimination: Goal: Will not experience complications related to bowel motility 04/29/2024 1736 by Oneita Rhoda Aquas, RN Outcome: Progressing 04/29/2024 1651 by Oneita Rhoda Aquas, RN Outcome: Progressing Goal: Will not experience complications related to urinary retention 04/29/2024 1736 by Oneita Rhoda Aquas, RN Outcome: Progressing 04/29/2024 1651 by Oneita Rhoda Aquas, RN Outcome: Progressing   Problem: Pain Managment: Goal: General experience of comfort will improve and/or be controlled 04/29/2024 1736 by Oneita Rhoda Aquas, RN Outcome: Progressing 04/29/2024 1651 by Oneita Rhoda Aquas, RN Outcome: Progressing   Problem: Safety: Goal: Ability to remain free from injury will improve 04/29/2024 1736 by Oneita Rhoda Aquas, RN Outcome: Progressing 04/29/2024 1651 by Oneita Rhoda Aquas, RN Outcome: Progressing   Problem: Skin Integrity: Goal: Risk for impaired skin integrity will decrease 04/29/2024 1736 by Oneita Rhoda Aquas, RN Outcome: Progressing 04/29/2024 1651 by Oneita Rhoda Aquas, RN Outcome: Progressing   "

## 2024-04-30 DIAGNOSIS — I639 Cerebral infarction, unspecified: Secondary | ICD-10-CM | POA: Diagnosis not present

## 2024-04-30 LAB — CBC
HCT: 39 % (ref 39.0–52.0)
Hemoglobin: 13.3 g/dL (ref 13.0–17.0)
MCH: 32.5 pg (ref 26.0–34.0)
MCHC: 34.1 g/dL (ref 30.0–36.0)
MCV: 95.4 fL (ref 80.0–100.0)
Platelets: 149 K/uL — ABNORMAL LOW (ref 150–400)
RBC: 4.09 MIL/uL — ABNORMAL LOW (ref 4.22–5.81)
RDW: 13.6 % (ref 11.5–15.5)
WBC: 8.4 K/uL (ref 4.0–10.5)
nRBC: 0 % (ref 0.0–0.2)

## 2024-04-30 LAB — BASIC METABOLIC PANEL WITH GFR
Anion gap: 9 (ref 5–15)
BUN: 26 mg/dL — ABNORMAL HIGH (ref 8–23)
CO2: 24 mmol/L (ref 22–32)
Calcium: 9.3 mg/dL (ref 8.9–10.3)
Chloride: 107 mmol/L (ref 98–111)
Creatinine, Ser: 0.99 mg/dL (ref 0.61–1.24)
GFR, Estimated: >60 mL/min
Glucose, Bld: 120 mg/dL — ABNORMAL HIGH (ref 70–99)
Potassium: 4.5 mmol/L (ref 3.5–5.1)
Sodium: 139 mmol/L (ref 135–145)

## 2024-04-30 LAB — PHOSPHORUS: Phosphorus: 3.6 mg/dL (ref 2.5–4.6)

## 2024-04-30 LAB — MAGNESIUM: Magnesium: 2.1 mg/dL (ref 1.7–2.4)

## 2024-04-30 NOTE — Plan of Care (Signed)
 Communicated well. No any complaints.   Problem: Education: Goal: Knowledge of disease or condition will improve Outcome: Progressing   Problem: Skin Integrity: Goal: Risk for impaired skin integrity will decrease Outcome: Progressing   Problem: Safety: Goal: Ability to remain free from injury will improve Outcome: Progressing   Problem: Elimination: Goal: Will not experience complications related to bowel motility Outcome: Progressing   Problem: Coping: Goal: Level of anxiety will decrease Outcome: Progressing   Problem: Activity: Goal: Risk for activity intolerance will decrease Outcome: Progressing   Problem: Clinical Measurements: Goal: Will remain free from infection Outcome: Progressing

## 2024-04-30 NOTE — Plan of Care (Signed)
" °  Problem: Education: Goal: Knowledge of disease or condition will improve Outcome: Progressing   Problem: Coping: Goal: Will identify appropriate support needs Outcome: Progressing   Problem: Health Behavior/Discharge Planning: Goal: Goals will be collaboratively established with patient/family Outcome: Progressing   Problem: Self-Care: Goal: Ability to communicate needs accurately will improve Outcome: Progressing   Problem: Education: Goal: Knowledge of General Education information will improve Description: Including pain rating scale, medication(s)/side effects and non-pharmacologic comfort measures Outcome: Progressing   Problem: Clinical Measurements: Goal: Respiratory complications will improve Outcome: Progressing   Problem: Pain Managment: Goal: General experience of comfort will improve and/or be controlled Outcome: Progressing   Problem: Ischemic Stroke/TIA Tissue Perfusion: Goal: Complications of ischemic stroke/TIA will be minimized Outcome: Not Progressing   Problem: Clinical Measurements: Goal: Cardiovascular complication will be avoided Outcome: Not Progressing   Problem: Activity: Goal: Risk for activity intolerance will decrease Outcome: Not Progressing   "

## 2024-04-30 NOTE — Progress Notes (Addendum)
 Physical Therapy Treatment Patient Details Name: Robert Everett MRN: [id number] DOB: [birth year] Today's Date: 04/30/2024   History of Present Illness Pt is 89 yo presenting to [facility] on 1/8 due to L arm/leg weakness, L facial droop. MRI brain showed stroke in the posterior aspect of the R corona radiata. Multiple remote infarcts including the L superior frontal gyrus, R occipital lobe, cerebellum. PMH: TIA, DVT, BCC, R shoulder arthritis   PT Comments  Pt received in supine and agreeable to PT session. Pt required MinA for bed mobility and MinA/ModA to stand with 1HH. Able to step-pivot to the right with ModA via 1HH. L LE blocked throughout, however, no overt buckling observed. Worked on seated weight-shifts forwards in the recliner with multiple repetitions of sit<>stand transfers performed. Also worked on weight-shifting in standing with assist needed to facilitate movement and provide steadying assist. Continue to recommend >3hrs post acute rehab with acute PT to follow.     If plan is discharge home, recommend the following: Help with stairs or ramp for entrance;Assist for transportation;Assistance with cooking/housework;A lot of help with walking and/or transfers;A lot of help with bathing/dressing/bathroom   Can travel by private vehicle      No  Equipment Recommendations  Wheelchair cushion (measurements PT);Wheelchair (measurements PT)       Precautions / Restrictions Precautions Precautions: Fall Recall of Precautions/Restrictions: Intact Restrictions Weight Bearing Restrictions Per Provider Order: No     Mobility  Bed Mobility Overal bed mobility: Needs Assistance Bed Mobility: Supine to Sit    Supine to sit: Min assist    General bed mobility comments: Assist to bring hips forward to EOB    Transfers Overall transfer level: Needs assistance Equipment used: 1 person hand held assist Transfers: Sit to/from Stand, Bed to chair/wheelchair/BSC Sit to Stand: Mod assist, Min  assist   Step pivot transfers: Mod assist    General transfer comment: ModA/MinA for boost-up and steadying assist with L UE supported in gait belt. Able to step-pivot to the right with L LE blocked. Took a few shuffling steps with R LE towards recliner.    Ambulation/Gait  Pre-gait activities: bilateral weight-shifts with 1UE support and assist to shift weight General Gait Details: need +2 for gait trials    Modified Rankin (Stroke Patients Only) Modified Rankin (Stroke Patients Only) Pre-Morbid Rankin Score: No symptoms Modified Rankin: Severe disability     Balance Overall balance assessment: Needs assistance Sitting-balance support: Single extremity supported, Feet supported Sitting balance-Leahy Scale: Poor Sitting balance - Comments: reliant on 1UE support with intermittent posterior lean. CGA/ModA to maintain balance Postural control: Posterior lean Standing balance support: Single extremity supported, During functional activity, Reliant on assistive device for balance Standing balance-Leahy Scale: Poor Standing balance comment: reliant on ModA for dynamic balance with posterior lean     Communication Communication Communication: Impaired Factors Affecting Communication: Hearing impaired;Difficulty expressing self;Reduced clarity of speech  Cognition Arousal: Alert Behavior During Therapy: WFL for tasks assessed/performed   PT - Cognitive impairments: No apparent impairments    Following commands: Intact      Cueing Cueing Techniques: Verbal cues, Visual cues  Exercises General Exercises - Lower Extremity Long Arc Quad: AROM, Left, 10 reps, Seated (x5 sec hold) Heel Slides: Left, 10 reps, Supine, AAROM Other Exercises Other Exercises: x5 sit<>stand repetitions with 1UE support and ModA/MinA Other Exercises: x5 trunk pulls with R UE Other Exercises: multiple seated weight-shifts to scoot towards edge of recliner        Pertinent Vitals/Pain Pain  Assessment Pain Assessment: No/denies pain     PT Goals (current goals can now be found in the care plan section) Acute Rehab PT Goals PT Goal Formulation: With patient/family Time For Goal Achievement: 05/11/24 Potential to Achieve Goals: Good Progress towards PT goals: Progressing toward goals    Frequency    Min 3X/week       AM-PAC PT 6 Clicks Mobility   Outcome Measure  Help needed turning from your back to your side while in a flat bed without using bedrails?: A Little Help needed moving from lying on your back to sitting on the side of a flat bed without using bedrails?: A Little Help needed moving to and from a bed to a chair (including a wheelchair)?: A Lot Help needed standing up from a chair using your arms (e.g., wheelchair or bedside chair)?: A Lot Help needed to walk in hospital room?: Total Help needed climbing 3-5 steps with a railing? : Total 6 Click Score: 12    End of Session Equipment Utilized During Treatment: Gait belt Activity Tolerance: Patient tolerated treatment well Patient left: in chair;with call bell/phone within reach;with chair alarm set Nurse Communication: Mobility status PT Visit Diagnosis: Unsteadiness on feet (R26.81);Other abnormalities of gait and mobility (R26.89);Hemiplegia and hemiparesis Hemiplegia - Right/Left: Left Hemiplegia - dominant/non-dominant: Non-dominant Hemiplegia - caused by: Cerebral infarction     Time: 1305-1340 PT Time Calculation (min) (ACUTE ONLY): 35 min  Charges:    $Therapeutic Exercise: 8-22 mins $Therapeutic Activity: 8-22 mins PT General Charges $$ ACUTE PT VISIT: 1 Visit                    Kate ORN, PT, DPT Secure Chat Preferred  Rehab Office [phone]   Kate BRAVO [name] 04/30/2024, 2:23 PM

## 2024-04-30 NOTE — Progress Notes (Signed)
 " PROGRESS NOTE    Robert Everett  FMW:[id number] DOB: [birth year] DOA: 04/27/2024 PCP: Merilee, L.Addie, MD (Inactive)  Chief Complaint  Patient presents with   Code Stroke    Brief Narrative:   89 yo with hx TIA, VTE, dyslipidemia who presented with L sided weakness and was found to have Merle Cirelli R corona radiata stroke.   Neurology is following, now on DAPT.   Plan is for CIR.  Assessment & Plan:   Principal Problem:   Acute CVA (cerebrovascular accident) (HCC) Active Problems:   Malnutrition of moderate degree  Acute CVA MRI with stroke in the posterior aspect of the R corona radiata.  Multiple remote infarcts including the L superior frontal gyrus, R occipital lobe, cerebellum. CTA head/neck without carotid stenosis, no significant vertebrobasilar or intracranial disease Echo with preserved EF, no RWMA, positive bubble study suggestive of interatrial shunt LE US  with chronic DVT LDL 72, A1c 5.8 Appreciate neurology recommendations - cryptogenic strokes - recommending DAPT for 3 weeks then plavix  alone.  Lipitor increased to 40 mg.  2nd Degree Heart Block Appreciate cardiology assistance Blood cx ng  Eccentric Aortic Regurgitation Likely moderate to severe Will defer additional workup to outpatient setting  Hypertension  Permissive hypertension, not on any home BP meds  Dyslipidemia Lipitor increased as above  Prediabetes A1c 5.8   To CIR when able    DVT prophylaxis: heparin  Code Status: full Family Communication: none Disposition:   Status is: Inpatient Remains inpatient appropriate because: need for continued neurology follow up   Consultants:  neurology  Procedures:  Echo IMPRESSIONS     1. Left ventricular ejection fraction, by estimation, is 60 to 65%. The  left ventricle has normal function. The left ventricle has no regional  wall motion abnormalities. Left ventricular diastolic parameters are  consistent with Grade I diastolic  dysfunction  (impaired relaxation).   2. Right ventricular systolic function is normal. The right ventricular  size is normal.   3. Left atrial size was mildly dilated.   4. The mitral valve is normal in structure. Trivial mitral valve  regurgitation. No evidence of mitral stenosis.   5. The aortic valve is tricuspid. There is mild calcification of the  aortic valve. Aortic valve regurgitation is moderate to severe. Aortic  valve sclerosis is present, with no evidence of aortic valve stenosis.   6. Aortic dilatation noted. There is borderline dilatation of the  ascending aorta, measuring 38 mm.   7. Agitated saline contrast bubble study was positive with shunting  observed within 3-6 cardiac cycles suggestive of interatrial shunt.   Comparison(s): No prior Echocardiogram.   Conclusion(s)/Recommendation(s): Very eccentric aortic regurgitation,  likely moderate to severe. Could consider TEE or cardiac MRI for further  evaluation. Bubble study positive for intraatrial shunt.   Antimicrobials:  Anti-infectives (From admission, onward)    None       Subjective: No new complaints  Objective: Vitals:   04/30/24 0400 04/30/24 0500 04/30/24 1134 04/30/24 1543  BP:   (!) 131/49 (!) 145/47  Pulse:   (!) 58 66  Resp: 20 16 20 18   Temp:   98 F (36.7 C) 97.7 F (36.5 C)  TempSrc:   Oral   SpO2:   96% 95%  Weight:      Height:        Intake/Output Summary (Last 24 hours) at 04/30/2024 1638 Last data filed at 04/30/2024 1440 Gross per 24 hour  Intake --  Output 1285 ml  Net -1285 ml  Filed Weights   04/27/24 0934  Weight: 86.2 kg    Examination:  General: No acute distress. Lungs: unlabored Neurological: flaccid LUE.  Improving LLE strength. Extremities: No clubbing or cyanosis. No edema.   Data Reviewed: I have personally reviewed following labs and imaging studies  CBC: Recent Labs  Lab 04/27/24 0930 04/27/24 0938 04/28/24 0148 04/29/24 0201 04/30/24 0216  WBC 6.0  --   7.6 7.2 8.4  NEUTROABS 3.5  --  4.5 4.1  --   HGB 14.3 14.3 13.0 12.8* 13.3  HCT 42.3 42.0 38.6* 37.1* 39.0  MCV 96.8  --  96.0 95.4 95.4  PLT 154  --  134* 141* 149*    Basic Metabolic Panel: Recent Labs  Lab 04/27/24 0930 04/27/24 0938 04/28/24 0148 04/29/24 0201 04/30/24 0216  NA 142 144 141 141 139  K 3.8 3.8 3.7 4.1 4.5  CL 109 107 108 108 107  CO2 24  --  25 26 24   GLUCOSE 112* 112* 108* 125* 120*  BUN 20 23 21 22  26*  CREATININE 0.96 1.00 1.01 0.91 0.99  CALCIUM  9.2  --  8.8* 9.1 9.3  MG  --   --  2.0 2.0 2.1  PHOS  --   --  3.3 3.3 3.6    GFR: Estimated Creatinine Clearance: 56.1 mL/min (by C-G formula based on SCr of 0.99 mg/dL).  Liver Function Tests: Recent Labs  Lab 04/27/24 0930 04/28/24 0148 04/29/24 0201  AST 26  25 20 24   ALT 30  30 24 31   ALKPHOS 80  81 67 68  BILITOT 1.0  1.0 0.8 0.7  PROT 6.4*  6.4* 5.4* 5.4*  ALBUMIN 3.9  3.9 3.3* 3.3*    CBG: Recent Labs  Lab 04/27/24 0930  GLUCAP 104*     Recent Results (from the past 240 hours)  Culture, blood (Routine X 2) w Reflex to ID Panel     Status: None (Preliminary result)   Collection Time: 04/29/24  9:19 PM   Specimen: BLOOD LEFT HAND  Result Value Ref Range Status   Specimen Description BLOOD LEFT HAND  Final   Special Requests   Final    BOTTLES DRAWN AEROBIC AND ANAEROBIC Blood Culture adequate volume   Culture   Final    NO GROWTH < 12 HOURS Performed at [facility], [address], [place], [place] [address]    Report Status PENDING  Incomplete  Culture, blood (Routine X 2) w Reflex to ID Panel     Status: None (Preliminary result)   Collection Time: 04/29/24  9:22 PM   Specimen: BLOOD RIGHT HAND  Result Value Ref Range Status   Specimen Description BLOOD RIGHT HAND  Final   Special Requests   Final    BOTTLES DRAWN AEROBIC AND ANAEROBIC Blood Culture adequate volume   Culture   Final    NO GROWTH < 12 HOURS Performed at [facility], [address], [place], [place] [address]    Report Status PENDING  Incomplete         Radiology Studies: CT HEAD WO CONTRAST ( ) Result Date: 04/29/2024 EXAM: CT HEAD WITHOUT CONTRAST 04/29/2024 06:30:45 PM TECHNIQUE: CT of the head was performed without the administration of intravenous contrast. Automated exposure control, iterative reconstruction, and/or weight based adjustment of the mA/kV was utilized to reduce the radiation dose to as low as reasonably achievable. COMPARISON: 04/27/2024 CLINICAL HISTORY: Mental status change, unknown cause. FINDINGS: BRAIN AND VENTRICLES: No  acute hemorrhage. Evolving acute/subacute infarct in the right parietal lobe. Stable patchy low-attenuation in the supratentorial white matter. Stable encephalomalacia in the left frontal lobe. Enlarged ventricular system and sulcal spaces, proportionate, consistent with diffuse atrophy. No extra-axial collection. No mass effect or midline shift. ORBITS: Chronic left orbital floor fracture again visualized. SINUSES: No acute abnormality. SOFT TISSUES AND SKULL: No acute soft tissue abnormality. No skull fracture. IMPRESSION: 1. Evolving acute/subacute infarct in the right parietal lobe. 2. Stable patchy low-attenuation in the supratentorial white matter. 3. Stable encephalomalacia in the left frontal lobe. 4. Diffuse cerebral atrophy. Electronically signed by: Franky Stanford MD MD 04/29/2024 07:05 PM EST RP Workstation: [id number]        Scheduled Meds:  aspirin  EC  81 mg Oral Daily   atorvastatin   40 mg Oral Daily   clopidogrel   75 mg Oral Daily   feeding supplement  237 mL Oral BID BM   heparin   5,000 Units Subcutaneous Q8H   multivitamin with minerals  1 tablet Oral Daily   Continuous Infusions:   LOS: 3 days    Time spent: over 30 min     Meliton Monte, MD [facility]   To contact the attending provider between 7A-7P or the covering provider during after hours 7P-7A, please log into the web site  [email or web address] and access using universal [place] password for that web site. If you do not have the password, please call the hospital operator.  04/30/2024, 4:38 PM    "

## 2024-04-30 NOTE — Consult Note (Signed)
 Ref: Merilee, L.Addie, MD (Inactive)   Subjective:  Awake. No chest pain. No fever.  Objective:  Vital Signs in the last 24 hours: Temp:  [97.9 F (36.6 C)-98.2 F (36.8 C)] 98 F (36.7 C) (01/11 1134) Pulse Rate:  [53-64] 58 (01/11 1134) Cardiac Rhythm: Sinus bradycardia;Heart block (01/11 0700) Resp:  [16-20] 20 (01/11 1134) BP: (131-160)/(48-57) 131/49 (01/11 1134) SpO2:  [93 %-96 %] 96 % (01/11 1134)  Physical Exam: BP Readings from Last 1 Encounters:  04/30/24 (!) 131/49     Wt Readings from Last 1 Encounters:  04/27/24 86.2 kg    Weight change:  Body mass index is 28.06 kg/m. HEENT: [place]/AT, Eyes-Blue, Conjunctiva-Pink, Sclera-Non-icteric Neck: No JVD, No bruit, Trachea midline. Lungs:  Clear, Bilateral. Cardiac:  Regular rhythm, normal S1 and S2, no S3. II/VI systolic murmur. Abdomen:  Soft, non-tender. BS present. Extremities:  No edema present. No cyanosis. No clubbing. CNS: AxOx3, Cranial nerves grossly intact, left sided weakness.  Skin: Warm and dry.   Intake/Output from previous day: 01/10 0701 - 01/11 0700 In: -  Out: 1375 [Urine:1375]    Lab Results: BMET    Component Value Date/Time   NA 139 04/30/2024 0216   NA 141 04/29/2024 0201   NA 141 04/28/2024 0148   K 4.5 04/30/2024 0216   K 4.1 04/29/2024 0201   K 3.7 04/28/2024 0148   CL 107 04/30/2024 0216   CL 108 04/29/2024 0201   CL 108 04/28/2024 0148   CO2 24 04/30/2024 0216   CO2 26 04/29/2024 0201   CO2 25 04/28/2024 0148   GLUCOSE 120 (H) 04/30/2024 0216   GLUCOSE 125 (H) 04/29/2024 0201   GLUCOSE 108 (H) 04/28/2024 0148   BUN 26 (H) 04/30/2024 0216   BUN 22 04/29/2024 0201   BUN 21 04/28/2024 0148   CREATININE 0.99 04/30/2024 0216   CREATININE 0.91 04/29/2024 0201   CREATININE 1.01 04/28/2024 0148   CALCIUM  9.3 04/30/2024 0216   CALCIUM  9.1 04/29/2024 0201   CALCIUM  8.8 (L) 04/28/2024 0148   GFRNONAA >60 04/30/2024 0216   GFRNONAA >60 04/29/2024 0201   GFRNONAA >60 04/28/2024  0148   GFRAA 73 (L) 07/05/2014 1710   GFRAA 77 (L) 06/22/2014 1557   CBC    Component Value Date/Time   WBC 8.4 04/30/2024 0216   RBC 4.09 (L) 04/30/2024 0216   HGB 13.3 04/30/2024 0216   HCT 39.0 04/30/2024 0216   PLT 149 (L) 04/30/2024 0216   MCV 95.4 04/30/2024 0216   MCH 32.5 04/30/2024 0216   MCHC 34.1 04/30/2024 0216   RDW 13.6 04/30/2024 0216   LYMPHSABS 2.0 04/29/2024 0201   MONOABS 0.7 04/29/2024 0201   EOSABS 0.4 04/29/2024 0201   BASOSABS 0.0 04/29/2024 0201   HEPATIC Function Panel Recent Labs    04/27/24 0930 04/28/24 0148 04/29/24 0201  PROT 6.4*  6.4* 5.4* 5.4*  ALBUMIN 3.9  3.9 3.3* 3.3*  AST 26  25 20 24   ALT 30  30 24 31   ALKPHOS 80  81 67 68  BILIDIR 0.3*  --   --   IBILI 0.6  --   --    HEMOGLOBIN A1C Lab Results  Component Value Date   MPG 119.76 04/27/2024   CARDIAC ENZYMES No results found for: CKTOTAL, CKMB, CKMBINDEX, TROPONINI BNP No results for input(s): PROBNP in the last 8760 hours. TSH No results for input(s): TSH in the last 8760 hours. CHOLESTEROL Recent Labs    04/27/24 0930 04/28/24 0148  CHOL 150 124    Scheduled Meds:  aspirin  EC  81 mg Oral Daily   atorvastatin   40 mg Oral Daily   clopidogrel   75 mg Oral Daily   feeding supplement  237 mL Oral BID BM   heparin   5,000 Units Subcutaneous Q8H   multivitamin with minerals  1 tablet Oral Daily   Continuous Infusions: PRN Meds:.acetaminophen  **OR** acetaminophen  (TYLENOL ) oral liquid 160 mg/5 mL **OR** acetaminophen , senna-docusate  Assessment/Plan: Acute right parietal stroke H/O DVT H/O PE Moderate protein calorie malnutrition Sinus rhythm with 1st degree AV block. HTN Dyslipidemia  Plan: Continue medical treatment for now.    LOS: 3 days   Time spent including chart review, lab review, examination, discussion with patient/Nurse : 30 min   Salena Negri  MD  04/30/2024, 3:37 PM

## 2024-05-01 NOTE — Progress Notes (Signed)
 Inpatient Rehab Admissions Coordinator:    CIR following. Insurance auth received. I await completion of TEE prior to admit.   Leita Kleine, MS, CCC-SLP Rehab Admissions Coordinator  [phone] (celll) [phone] (office)

## 2024-05-01 NOTE — Progress Notes (Signed)
 Occupational Therapy Treatment Patient Details Name: Robert Everett MRN: [id number] DOB: July 25, 1933 Today's Date: 05/01/2024   History of present illness Pt is 89 yo presenting to [facility] on 1/8 due to L arm/leg weakness, L facial droop. MRI brain showed stroke in the posterior aspect of the R corona radiata. Multiple remote infarcts including the L superior frontal gyrus, R occipital lobe, cerebellum. PMH: TIA, DVT, BCC, R shoulder arthritis   OT comments  Patient demonstrating good gains with OT treatment with min assist to get to EOB. Patient attempted to stand from EOB with one person HHA with posterior leaning. RW used to address balance with patient able to ambulate to sink with assistance for LUE grip on RW. Patient performed bathing seated at sink with mod assist seated and max assist for peri area bathing back.  Patient will benefit from intensive inpatient follow-up therapy, >3 hours/day.  Acute OT to continue to follow to address established goals to facilitate DC to next venue of care.        If plan is discharge home, recommend the following:  Assist for transportation;A lot of help with bathing/dressing/bathroom;A lot of help with walking and/or transfers   Equipment Recommendations  BSC/3in1;Tub/shower bench    Recommendations for Other Services      Precautions / Restrictions Precautions Precautions: Fall Recall of Precautions/Restrictions: Intact Restrictions Weight Bearing Restrictions Per Provider Order: No       Mobility Bed Mobility Overal bed mobility: Needs Assistance Bed Mobility: Supine to Sit     Supine to sit: Min assist     General bed mobility comments: assistance with LUE and scooting towards EOB    Transfers Overall transfer level: Needs assistance Equipment used: Rolling walker (2 wheels), 1 person hand held assist Transfers: Sit to/from Stand, Bed to chair/wheelchair/BSC Sit to Stand: Mod assist, Min assist     Step pivot transfers: Mod  assist     General transfer comment: RW used with assistance for LUE grip to allow for increased stability     Balance Overall balance assessment: Needs assistance Sitting-balance support: Single extremity supported, Feet supported Sitting balance-Leahy Scale: Poor Sitting balance - Comments: CGA for EOB due to posterior bias Postural control: Posterior lean Standing balance support: Single extremity supported, During functional activity, Reliant on assistive device for balance Standing balance-Leahy Scale: Poor Standing balance comment: reliant on external support due to posterior leaning                           ADL either performed or assessed with clinical judgement   ADL Overall ADL's : Needs assistance/impaired     Grooming: Wash/dry hands;Wash/dry face;Brushing hair;Minimal assistance;Sitting (assistance with combing hair)   Upper Body Bathing: Moderate assistance;Sitting Upper Body Bathing Details (indicate cue type and reason): assistance for bathing RUE and back Lower Body Bathing: Moderate assistance;Maximal assistance;Sit to/from stand Lower Body Bathing Details (indicate cue type and reason): patient able to bathe peri area back and upper legs while sitting and required max assist for peri area back while standing Upper Body Dressing : Moderate assistance;Sitting Upper Body Dressing Details (indicate cue type and reason): gown change                        Extremity/Trunk Assessment              Vision       Perception     Praxis  Communication Communication Communication: Impaired Factors Affecting Communication: Hearing impaired;Difficulty expressing self;Reduced clarity of speech   Cognition Arousal: Alert Behavior During Therapy: WFL for tasks assessed/performed Cognition: No apparent impairments                               Following commands: Intact        Cueing   Cueing Techniques: Verbal cues,  Visual cues  Exercises      Shoulder Instructions       General Comments      Pertinent Vitals/ Pain       Pain Assessment Pain Assessment: No/denies pain  Home Living                                          Prior Functioning/Environment              Frequency  Min 2X/week        Progress Toward Goals  OT Goals(current goals can now be found in the care plan section)  Progress towards OT goals: Progressing toward goals  Acute Rehab OT Goals Patient Stated Goal: to go home OT Goal Formulation: With patient Time For Goal Achievement: 04/28/24 Potential to Achieve Goals: Good ADL Goals Pt Will Perform Grooming: with min assist;standing Pt Will Perform Upper Body Bathing: with min assist;sitting Pt Will Perform Lower Body Bathing: sitting/lateral leans;sit to/from stand;with min assist Pt Will Perform Upper Body Dressing: with contact guard assist;sitting Pt Will Perform Lower Body Dressing: with min assist;sitting/lateral leans;sit to/from stand Pt Will Transfer to Toilet: with min assist;ambulating;regular height toilet Pt Will Perform Toileting - Clothing Manipulation and hygiene: with min assist  Plan      Co-evaluation                 AM-PAC OT 6 Clicks Daily Activity     Outcome Measure   Help from another person eating meals?: A Little Help from another person taking care of personal grooming?: A Little Help from another person toileting, which includes using toliet, bedpan, or urinal?: A Lot Help from another person bathing (including washing, rinsing, drying)?: A Lot Help from another person to put on and taking off regular upper body clothing?: A Lot Help from another person to put on and taking off regular lower body clothing?: A Lot 6 Click Score: 14    End of Session Equipment Utilized During Treatment: Gait belt;Rolling walker (2 wheels)  OT Visit Diagnosis: Unsteadiness on feet (R26.81);Muscle weakness  (generalized) (M62.81);Hemiplegia and hemiparesis Hemiplegia - Right/Left: Left Hemiplegia - dominant/non-dominant: Non-Dominant Hemiplegia - caused by: Cerebral infarction   Activity Tolerance Patient tolerated treatment well   Patient Left in chair;with call bell/phone within reach;with chair alarm set   Nurse Communication Mobility status        Time: 9245-9177 OT Time Calculation (min): 28 min  Charges: OT General Charges $OT Visit: 1 Visit OT Treatments $Self Care/Home Management : 23-37 mins  Dick Laine, OTA Acute Rehabilitation Services  Office [phone]   Jeb LITTIE Laine 05/01/2024, 8:52 AM

## 2024-05-01 NOTE — Plan of Care (Signed)
" °  Problem: Education: Goal: Knowledge of disease or condition will improve Outcome: Progressing   Problem: Health Behavior/Discharge Planning: Goal: Goals will be collaboratively established with patient/family Outcome: Progressing   Problem: Education: Goal: Knowledge of General Education information will improve Description: Including pain rating scale, medication(s)/side effects and non-pharmacologic comfort measures Outcome: Progressing   Problem: Clinical Measurements: Goal: Will remain free from infection Outcome: Progressing Goal: Respiratory complications will improve Outcome: Progressing   Problem: Pain Managment: Goal: General experience of comfort will improve and/or be controlled Outcome: Progressing   Problem: Ischemic Stroke/TIA Tissue Perfusion: Goal: Complications of ischemic stroke/TIA will be minimized Outcome: Not Progressing   Problem: Clinical Measurements: Goal: Cardiovascular complication will be avoided Outcome: Not Progressing   "

## 2024-05-01 NOTE — Care Management Important Message (Signed)
 Important Message  Patient Details  Name: Robert Everett MRN: [id number] Date of Birth: 26-Nov-1933   Important Message Given:  Yes - Medicare IM     Claretta Deed 05/01/2024, 3:34 PM

## 2024-05-01 NOTE — Plan of Care (Signed)

## 2024-05-01 NOTE — TOC CAGE-AID Note (Signed)
 Transition of Care [facility]) - CAGE-AID Screening   Patient Details  Name: Robert Everett MRN: [id number] Date of Birth: 04/05/1934  Transition of Care [facility]) CM/SW Contact:    Ajeenah Heiny E Kayen Grabel, LCSW Phone Number: 05/01/2024, 10:10 AM   Clinical Narrative: No SA noted.   CAGE-AID Screening:    Have You Ever Felt You Ought to Cut Down on Your Drinking or Drug Use?: No Have People Annoyed You By Critizing Your Drinking Or Drug Use?: No Have You Felt Bad Or Guilty About Your Drinking Or Drug Use?: No Have You Ever Had a Drink or Used Drugs First Thing In The Morning to Steady Your Nerves or to Get Rid of a Hangover?: No CAGE-AID Score: 0  Substance Abuse Education Offered: No

## 2024-05-01 NOTE — Progress Notes (Signed)
 Physical Therapy Treatment Patient Details Name: Robert Everett MRN: [id number] DOB: Apr 19, 1934 Today's Date: 05/01/2024   History of Present Illness Pt is 89 yo presenting to [facility] on 1/8 due to L arm/leg weakness, L facial droop. MRI brain showed stroke in the posterior aspect of the R corona radiata. Multiple remote infarcts including the L superior frontal gyrus, R occipital lobe, cerebellum. PMH: TIA, DVT, BCC, R shoulder arthritis    PT Comments  Patient up in chair since OT session early this a.m. Patient fatigued and agrees to work on standing, balance, and gait training with goal to return back to bed at end of session. Sit to stand from recliner with LUE supported on RW by PT. Came to stand with min assist with slight posterior imbalance. Progressed to ambulation with +1 mod assist to maintain LUE on RW and for steering RW. Patient with short, shuffling steps with LLE--able to partially correct with cues. Patient tolerated 60 ft with mod assist and RW. Patient very motivated and will do well with post-acute inpatient therapies >3 hrs/day.     If plan is discharge home, recommend the following: Help with stairs or ramp for entrance;Assist for transportation;Assistance with cooking/housework;A lot of help with walking and/or transfers;A lot of help with bathing/dressing/bathroom   Can travel by private vehicle        Equipment Recommendations  Wheelchair cushion (measurements PT);Wheelchair (measurements PT)    Recommendations for Other Services       Precautions / Restrictions Precautions Precautions: Fall Recall of Precautions/Restrictions: Intact Restrictions Weight Bearing Restrictions Per Provider Order: No     Mobility  Bed Mobility Overal bed mobility: Needs Assistance Bed Mobility: Sit to Supine       Sit to supine: Min assist   General bed mobility comments: assist for raising LLE    Transfers Overall transfer level: Needs assistance Equipment used: Rolling  walker (2 wheels), 1 person hand held assist Transfers: Sit to/from Stand Sit to Stand: Min assist           General transfer comment: RW used with assistance for LUE grip to allow for increased stability    Ambulation/Gait Ambulation/Gait assistance: Mod assist Gait Distance (Feet): 60 Feet Assistive device: Rolling walker (2 wheels) Gait Pattern/deviations: Step-through pattern, Decreased step length - left, Decreased dorsiflexion - left, Drifts right/left   Gait velocity interpretation: <1.8 ft/sec, indicate of risk for recurrent falls   General Gait Details: held Lt hand on RW and assisted with all steering/turning of RW   Stairs             Wheelchair Mobility     Tilt Bed    Modified Rankin (Stroke Patients Only) Modified Rankin (Stroke Patients Only) Pre-Morbid Rankin Score: No symptoms Modified Rankin: Moderately severe disability     Balance Overall balance assessment: Needs assistance Sitting-balance support: Feet supported Sitting balance-Leahy Scale: Fair     Standing balance support: Single extremity supported, During functional activity, Reliant on assistive device for balance Standing balance-Leahy Scale: Poor Standing balance comment: reliant on external support due to posterior leaning                            Communication Communication Communication: Impaired Factors Affecting Communication: Hearing impaired;Difficulty expressing self;Reduced clarity of speech  Cognition Arousal: Alert Behavior During Therapy: WFL for tasks assessed/performed   PT - Cognitive impairments: No apparent impairments  Following commands: Intact      Cueing Cueing Techniques: Verbal cues, Visual cues  Exercises      General Comments General comments (skin integrity, edema, etc.): HR 90-95 during activity      Pertinent Vitals/Pain Pain Assessment Pain Assessment: No/denies pain    Home Living                           Prior Function            PT Goals (current goals can now be found in the care plan section) Acute Rehab PT Goals Patient Stated Goal: improve mobility and return to PLOF Time For Goal Achievement: 05/11/24 Potential to Achieve Goals: Good Progress towards PT goals: Progressing toward goals    Frequency    Min 3X/week      PT Plan      Co-evaluation              AM-PAC PT 6 Clicks Mobility   Outcome Measure  Help needed turning from your back to your side while in a flat bed without using bedrails?: A Little Help needed moving from lying on your back to sitting on the side of a flat bed without using bedrails?: A Little Help needed moving to and from a bed to a chair (including a wheelchair)?: A Lot Help needed standing up from a chair using your arms (e.g., wheelchair or bedside chair)?: A Little Help needed to walk in hospital room?: A Lot Help needed climbing 3-5 steps with a railing? : Total 6 Click Score: 14    End of Session Equipment Utilized During Treatment: Gait belt Activity Tolerance: Patient tolerated treatment well Patient left: with call bell/phone within reach;in bed;with bed alarm set Nurse Communication: Mobility status PT Visit Diagnosis: Unsteadiness on feet (R26.81);Other abnormalities of gait and mobility (R26.89);Hemiplegia and hemiparesis Hemiplegia - Right/Left: Left Hemiplegia - dominant/non-dominant: Non-dominant Hemiplegia - caused by: Cerebral infarction     Time: 1246-1306 PT Time Calculation (min) (ACUTE ONLY): 20 min  Charges:    $Gait Training: 8-22 mins PT General Charges $$ ACUTE PT VISIT: 1 Visit                      [name], PT Acute Rehabilitation Services  Office [phone]    Macario SHAUNNA Soja 05/01/2024, 1:12 PM

## 2024-05-01 NOTE — TOC Progression Note (Signed)
 Transition of Care [facility]) - Progression Note    Patient Details  Name: MARIAH HARN MRN: [id number] Date of Birth: Apr 16, 1934  Transition of Care [facility]) CM/SW Contact  Andrez JULIANNA George, RN Phone Number: 05/01/2024, 1:07 PM  Clinical Narrative:     Pt has insurance approval for CIR. Awaiting TEE prior to dc to CIR.  IP Care management following.  Expected Discharge Plan: IP Rehab Facility Barriers to Discharge: Continued Medical Work up               Expected Discharge Plan and Services   Discharge Planning Services: CM Consult Post Acute Care Choice: IP Rehab Living arrangements for the past 2 months: Single Family Home                                       Social Drivers of Health (SDOH) Interventions SDOH Screenings   Food Insecurity: No Food Insecurity (04/27/2024)  Housing: Low Risk (04/27/2024)  Transportation Needs: No Transportation Needs (04/27/2024)  Utilities: Not At Risk (04/27/2024)  Social Connections: Socially Integrated (04/27/2024)  Tobacco Use: Medium Risk (04/27/2024)    Readmission Risk Interventions     No data to display

## 2024-05-01 NOTE — Progress Notes (Signed)
 " PROGRESS NOTE    Robert Everett  FMW:[id number] DOB: Dec 02, 1933 DOA: 04/27/2024 PCP: Merilee, L.Addie, MD (Inactive)  Chief Complaint  Patient presents with   Code Stroke    Brief Narrative:   89 yo with hx TIA, VTE, dyslipidemia who presented with L sided weakness and was found to have Robert [name] stroke.   Neurology is following, now on DAPT.   Plan is for CIR.  Discharge pending TEE per cardiology  Assessment & Plan:   Principal Problem:   Acute CVA (cerebrovascular accident) [facility]) Active Problems:   Malnutrition of moderate degree  Acute CVA MRI with stroke in the posterior aspect of the [name].  Multiple remote infarcts including the L superior frontal gyrus, [name], cerebellum. CTA head/neck without carotid stenosis, no significant vertebrobasilar or intracranial disease Echo with preserved EF, no RWMA, positive bubble study suggestive of interatrial shunt LE US  with chronic DVT LDL 72, A1c 5.8 Appreciate neurology recommendations - cryptogenic strokes - recommending DAPT for 3 weeks then plavix  alone.  Lipitor increased to 40 mg.  2nd Degree Heart Block Appreciate cardiology assistance Blood cx ng  Eccentric Aortic Regurgitation Likely moderate to severe Dr. Claudene planning for TEE before discharge based on our conversation 1/12  Hypertension  Permissive hypertension, not on any home BP meds  Dyslipidemia Lipitor increased as above  Prediabetes A1c 5.8   To CIR when able    DVT prophylaxis: heparin  Code Status: full Family Communication: none Disposition:   Status is: Inpatient Remains inpatient appropriate because: need for continued neurology follow up   Consultants:  neurology  Procedures:  Echo IMPRESSIONS     1. Left ventricular ejection fraction, by estimation, is 60 to 65%. The  left ventricle has normal function. The left ventricle has no regional  wall motion abnormalities. Left ventricular diastolic  parameters are  consistent with Grade I diastolic  dysfunction (impaired relaxation).   2. Right ventricular systolic function is normal. The right ventricular  size is normal.   3. Left atrial size was mildly dilated.   4. The mitral valve is normal in structure. Trivial mitral valve  regurgitation. No evidence of mitral stenosis.   5. The aortic valve is tricuspid. There is mild calcification of the  aortic valve. Aortic valve regurgitation is moderate to severe. Aortic  valve sclerosis is present, with no evidence of aortic valve stenosis.   6. Aortic dilatation noted. There is borderline dilatation of the  ascending aorta, measuring 38 mm.   7. Agitated saline contrast bubble study was positive with shunting  observed within 3-6 cardiac cycles suggestive of interatrial shunt.   Comparison(s): No prior Echocardiogram.   Conclusion(s)/Recommendation(s): Very eccentric aortic regurgitation,  likely moderate to severe. Could consider TEE or cardiac MRI for further  evaluation. Bubble study positive for intraatrial shunt.   Antimicrobials:  Anti-infectives (From admission, onward)    None       Subjective:  No complaints Continued L sided weakness - inability to move L arm  Objective: Vitals:   05/01/24 0345 05/01/24 0633 05/01/24 0836 05/01/24 1129  BP: (!) 126/39  (!) 154/47 (!) (P) 121/44  Pulse: (!) 50  (!) 56 (P) 79  Resp: 16 13    Temp: 98.3 F (36.8 C)  98 F (36.7 C) (P) 98.2 F (36.8 C)  TempSrc: Oral  Oral (P) Oral  SpO2: 96%  96% (P) 93%  Weight:      Height:  Intake/Output Summary (Last 24 hours) at 05/01/2024 1233 Last data filed at 05/01/2024 0643 Gross per 24 hour  Intake --  Output 1480 ml  Net -1480 ml   Filed Weights   04/27/24 0934  Weight: 86.2 kg    Examination:  General: No acute distress. Cardiovascular: HRRR Lungs: unlabored Neurological: L sided weakness.  Flaccid LUE. Extremities: No clubbing or cyanosis. No edema.    Data Reviewed: I have personally reviewed following labs and imaging studies  CBC: Recent Labs  Lab 04/27/24 0930 04/27/24 0938 04/28/24 0148 04/29/24 0201 04/30/24 0216  WBC 6.0  --  7.6 7.2 8.4  NEUTROABS 3.5  --  4.5 4.1  --   HGB 14.3 14.3 13.0 12.8* 13.3  HCT 42.3 42.0 38.6* 37.1* 39.0  MCV 96.8  --  96.0 95.4 95.4  PLT 154  --  134* 141* 149*    Basic Metabolic Panel: Recent Labs  Lab 04/27/24 0930 04/27/24 0938 04/28/24 0148 04/29/24 0201 04/30/24 0216  NA 142 144 141 141 139  K 3.8 3.8 3.7 4.1 4.5  CL 109 107 108 108 107  CO2 24  --  25 26 24   GLUCOSE 112* 112* 108* 125* 120*  BUN 20 23 21 22  26*  CREATININE 0.96 1.00 1.01 0.91 0.99  CALCIUM  9.2  --  8.8* 9.1 9.3  MG  --   --  2.0 2.0 2.1  PHOS  --   --  3.3 3.3 3.6    GFR: Estimated Creatinine Clearance: 56.1 mL/min (by C-G formula based on SCr of 0.99 mg/dL).  Liver Function Tests: Recent Labs  Lab 04/27/24 0930 04/28/24 0148 04/29/24 0201  AST 26  25 20 24   ALT 30  30 24 31   ALKPHOS 80  81 67 68  BILITOT 1.0  1.0 0.8 0.7  PROT 6.4*  6.4* 5.4* 5.4*  ALBUMIN 3.9  3.9 3.3* 3.3*    CBG: Recent Labs  Lab 04/27/24 0930  GLUCAP 104*     Recent Results (from the past 240 hours)  Culture, blood (Routine X 2) w Reflex to ID Panel     Status: None (Preliminary result)   Collection Time: 04/29/24  9:19 PM   Specimen: BLOOD LEFT HAND  Result Value Ref Range Status   Specimen Description BLOOD LEFT HAND  Final   Special Requests   Final    BOTTLES DRAWN AEROBIC AND ANAEROBIC Blood Culture adequate volume   Culture   Final    NO GROWTH 2 DAYS Performed at [facility], [address], [place], [place] [address]    Report Status PENDING  Incomplete  Culture, blood (Routine X 2) w Reflex to ID Panel     Status: None (Preliminary result)   Collection Time: 04/29/24  9:22 PM   Specimen: BLOOD RIGHT HAND  Result Value Ref Range Status   Specimen Description BLOOD RIGHT HAND  Final    Special Requests   Final    BOTTLES DRAWN AEROBIC AND ANAEROBIC Blood Culture adequate volume   Culture   Final    NO GROWTH 2 DAYS Performed at [facility], [address] [place], [place] [address]    Report Status PENDING  Incomplete         Radiology Studies: CT HEAD WO CONTRAST ( ) Result Date: 04/29/2024 EXAM: CT HEAD WITHOUT CONTRAST 04/29/2024 06:30:45 PM TECHNIQUE: CT of the head was performed without the administration of intravenous contrast. Automated exposure control, iterative reconstruction, and/or weight based  adjustment of the mA/kV was utilized to reduce the radiation dose to as low as reasonably achievable. COMPARISON: 04/27/2024 CLINICAL HISTORY: Mental status change, unknown cause. FINDINGS: BRAIN AND VENTRICLES: No acute hemorrhage. Evolving acute/subacute infarct in the right parietal lobe. Stable patchy low-attenuation in the supratentorial white matter. Stable encephalomalacia in the left frontal lobe. Enlarged ventricular system and sulcal spaces, proportionate, consistent with diffuse atrophy. No extra-axial collection. No mass effect or midline shift. ORBITS: Chronic left orbital floor fracture again visualized. SINUSES: No acute abnormality. SOFT TISSUES AND SKULL: No acute soft tissue abnormality. No skull fracture. IMPRESSION: 1. Evolving acute/subacute infarct in the right parietal lobe. 2. Stable patchy low-attenuation in the supratentorial white matter. 3. Stable encephalomalacia in the left frontal lobe. 4. Diffuse cerebral atrophy. Electronically signed by: Franky Stanford MD MD 04/29/2024 07:05 PM EST RP Workstation: [id number]        Scheduled Meds:  aspirin  EC  81 mg Oral Daily   atorvastatin   40 mg Oral Daily   clopidogrel   75 mg Oral Daily   feeding supplement  237 mL Oral BID BM   heparin   5,000 Units Subcutaneous Q8H   multivitamin with minerals  1 tablet Oral Daily   Continuous Infusions:   LOS: 4 days    Time spent: over 30 min      Meliton Monte, MD [facility]   To contact the attending provider between 7A-7P or the covering provider during after hours 7P-7A, please log into the web site [email or web address] and access using universal [place] password for that web site. If you do not have the password, please call the hospital operator.  05/01/2024, 12:33 PM    "

## 2024-05-02 ENCOUNTER — Inpatient Hospital Stay (HOSPITAL_COMMUNITY)

## 2024-05-02 ENCOUNTER — Other Ambulatory Visit: Payer: Self-pay

## 2024-05-02 ENCOUNTER — Encounter (HOSPITAL_COMMUNITY)
Admission: EM | Disposition: A | Discharge status: Rehabilitation Facility | Payer: Self-pay | Source: Home / Self Care | Attending: Family Medicine

## 2024-05-02 ENCOUNTER — Inpatient Hospital Stay (HOSPITAL_COMMUNITY)
Admission: AD | Admit: 2024-05-02 | Discharge: 2024-05-17 | DRG: 057 | Disposition: A | Discharge status: Home Health | Source: Intra-hospital | Attending: Physical Medicine and Rehabilitation | Admitting: Physical Medicine and Rehabilitation

## 2024-05-02 ENCOUNTER — Encounter (HOSPITAL_COMMUNITY): Payer: Self-pay | Admitting: Internal Medicine

## 2024-05-02 ENCOUNTER — Inpatient Hospital Stay (HOSPITAL_COMMUNITY): Admitting: Anesthesiology

## 2024-05-02 ENCOUNTER — Encounter (HOSPITAL_COMMUNITY): Payer: Self-pay | Admitting: Physical Medicine and Rehabilitation

## 2024-05-02 DIAGNOSIS — R7989 Other specified abnormal findings of blood chemistry: Secondary | ICD-10-CM | POA: Diagnosis present

## 2024-05-02 DIAGNOSIS — M545 Low back pain, unspecified: Secondary | ICD-10-CM | POA: Diagnosis present

## 2024-05-02 DIAGNOSIS — K5900 Constipation, unspecified: Secondary | ICD-10-CM | POA: Diagnosis present

## 2024-05-02 DIAGNOSIS — I350 Nonrheumatic aortic (valve) stenosis: Secondary | ICD-10-CM

## 2024-05-02 DIAGNOSIS — E785 Hyperlipidemia, unspecified: Secondary | ICD-10-CM | POA: Diagnosis present

## 2024-05-02 DIAGNOSIS — Q2112 Patent foramen ovale: Secondary | ICD-10-CM

## 2024-05-02 DIAGNOSIS — I351 Nonrheumatic aortic (valve) insufficiency: Secondary | ICD-10-CM | POA: Diagnosis present

## 2024-05-02 DIAGNOSIS — I69322 Dysarthria following cerebral infarction: Secondary | ICD-10-CM | POA: Diagnosis not present

## 2024-05-02 DIAGNOSIS — I69354 Hemiplegia and hemiparesis following cerebral infarction affecting left non-dominant side: Principal | ICD-10-CM

## 2024-05-02 DIAGNOSIS — G8194 Hemiplegia, unspecified affecting left nondominant side: Secondary | ICD-10-CM

## 2024-05-02 DIAGNOSIS — R32 Unspecified urinary incontinence: Secondary | ICD-10-CM | POA: Diagnosis not present

## 2024-05-02 DIAGNOSIS — I340 Nonrheumatic mitral (valve) insufficiency: Secondary | ICD-10-CM

## 2024-05-02 DIAGNOSIS — H9190 Unspecified hearing loss, unspecified ear: Secondary | ICD-10-CM | POA: Diagnosis present

## 2024-05-02 DIAGNOSIS — R351 Nocturia: Secondary | ICD-10-CM | POA: Diagnosis not present

## 2024-05-02 DIAGNOSIS — I82532 Chronic embolism and thrombosis of left popliteal vein: Secondary | ICD-10-CM | POA: Diagnosis present

## 2024-05-02 DIAGNOSIS — H9193 Unspecified hearing loss, bilateral: Secondary | ICD-10-CM | POA: Diagnosis present

## 2024-05-02 DIAGNOSIS — I69954 Hemiplegia and hemiparesis following unspecified cerebrovascular disease affecting left non-dominant side: Secondary | ICD-10-CM | POA: Diagnosis present

## 2024-05-02 DIAGNOSIS — R29810 Facial weakness: Secondary | ICD-10-CM | POA: Diagnosis present

## 2024-05-02 DIAGNOSIS — R5381 Other malaise: Secondary | ICD-10-CM | POA: Diagnosis present

## 2024-05-02 DIAGNOSIS — N401 Enlarged prostate with lower urinary tract symptoms: Secondary | ICD-10-CM | POA: Diagnosis not present

## 2024-05-02 DIAGNOSIS — Z79899 Other long term (current) drug therapy: Secondary | ICD-10-CM

## 2024-05-02 DIAGNOSIS — Z23 Encounter for immunization: Secondary | ICD-10-CM

## 2024-05-02 DIAGNOSIS — D696 Thrombocytopenia, unspecified: Secondary | ICD-10-CM | POA: Diagnosis present

## 2024-05-02 DIAGNOSIS — Z85828 Personal history of other malignant neoplasm of skin: Secondary | ICD-10-CM | POA: Diagnosis not present

## 2024-05-02 DIAGNOSIS — N400 Enlarged prostate without lower urinary tract symptoms: Secondary | ICD-10-CM | POA: Diagnosis not present

## 2024-05-02 DIAGNOSIS — Z7982 Long term (current) use of aspirin: Secondary | ICD-10-CM

## 2024-05-02 DIAGNOSIS — I10 Essential (primary) hypertension: Secondary | ICD-10-CM | POA: Diagnosis present

## 2024-05-02 DIAGNOSIS — G8929 Other chronic pain: Secondary | ICD-10-CM | POA: Diagnosis present

## 2024-05-02 DIAGNOSIS — Z7902 Long term (current) use of antithrombotics/antiplatelets: Secondary | ICD-10-CM | POA: Diagnosis not present

## 2024-05-02 DIAGNOSIS — M542 Cervicalgia: Secondary | ICD-10-CM | POA: Diagnosis not present

## 2024-05-02 DIAGNOSIS — Z87891 Personal history of nicotine dependence: Secondary | ICD-10-CM | POA: Diagnosis not present

## 2024-05-02 DIAGNOSIS — Z86711 Personal history of pulmonary embolism: Secondary | ICD-10-CM

## 2024-05-02 DIAGNOSIS — I959 Hypotension, unspecified: Secondary | ICD-10-CM | POA: Diagnosis not present

## 2024-05-02 DIAGNOSIS — I639 Cerebral infarction, unspecified: Principal | ICD-10-CM | POA: Diagnosis present

## 2024-05-02 HISTORY — PX: TRANSESOPHAGEAL ECHOCARDIOGRAM (CATH LAB): EP1270

## 2024-05-02 MED ORDER — ASPIRIN EC 81 MG PO TBEC: 81.0000 mg | DELAYED_RELEASE_TABLET | Freq: Every day | ORAL | Status: DC

## 2024-05-02 MED ORDER — GUAIFENESIN-DM 100-10 MG/5ML PO SYRP: 5.0000 mL | ORAL_SOLUTION | Freq: Four times a day (QID) | ORAL | Status: DC | PRN

## 2024-05-02 MED ORDER — TRAZODONE HCL 50 MG PO TABS
25.0000 mg | ORAL_TABLET | Freq: Every evening | ORAL | Status: DC | PRN
  Administered 2024-05-05 – 2024-05-16 (×3): 50 mg via ORAL
  Filled 2024-05-02 (×4): qty 1

## 2024-05-02 MED ORDER — ADULT MULTIVITAMIN W/MINERALS CH
1.0000 | ORAL_TABLET | Freq: Every day | ORAL | Status: DC
  Administered 2024-05-03 – 2024-05-17 (×15): 1 via ORAL
  Filled 2024-05-02 (×15): qty 1

## 2024-05-02 MED ORDER — ACETAMINOPHEN 325 MG PO TABS
325.0000 mg | ORAL_TABLET | ORAL | Status: DC | PRN
  Administered 2024-05-05 – 2024-05-15 (×3): 650 mg via ORAL
  Filled 2024-05-02 (×4): qty 2

## 2024-05-02 MED ORDER — PROCHLORPERAZINE 25 MG RE SUPP: 12.5000 mg | Freq: Four times a day (QID) | RECTAL | Status: DC | PRN

## 2024-05-02 MED ORDER — PHENYLEPHRINE 80 MCG/ML (10ML) SYRINGE FOR IV PUSH (FOR BLOOD PRESSURE SUPPORT)
PREFILLED_SYRINGE | INTRAVENOUS | Status: DC | PRN
  Administered 2024-05-02 (×3): 80 ug via INTRAVENOUS

## 2024-05-02 MED ORDER — EPHEDRINE SULFATE-NACL 50-0.9 MG/10ML-% IV SOSY
PREFILLED_SYRINGE | INTRAVENOUS | Status: DC | PRN
  Administered 2024-05-02 (×3): 5 mg via INTRAVENOUS

## 2024-05-02 MED ORDER — PHENYLEPHRINE HCL-NACL 20-0.9 MG/250ML-% IV SOLN
INTRAVENOUS | Status: DC | PRN
  Administered 2024-05-02: 25 ug/min via INTRAVENOUS

## 2024-05-02 MED ORDER — PROPOFOL 10 MG/ML IV BOLUS
INTRAVENOUS | Status: DC | PRN
  Administered 2024-05-02: 30 mg via INTRAVENOUS

## 2024-05-02 MED ORDER — ENOXAPARIN SODIUM 40 MG/0.4ML IJ SOSY
40.0000 mg | PREFILLED_SYRINGE | INTRAMUSCULAR | Status: DC
  Administered 2024-05-02 – 2024-05-03 (×2): 40 mg via SUBCUTANEOUS
  Filled 2024-05-02 (×2): qty 0.4

## 2024-05-02 MED ORDER — ATORVASTATIN CALCIUM 40 MG PO TABS
40.0000 mg | ORAL_TABLET | Freq: Every day | ORAL | Status: DC
  Administered 2024-05-03 – 2024-05-17 (×15): 40 mg via ORAL
  Filled 2024-05-02 (×15): qty 1

## 2024-05-02 MED ORDER — CLOPIDOGREL BISULFATE 75 MG PO TABS: 75.0000 mg | ORAL_TABLET | Freq: Every day | ORAL | Status: DC

## 2024-05-02 MED ORDER — GLYCOPYRROLATE PF 0.2 MG/ML IJ SOSY
PREFILLED_SYRINGE | INTRAMUSCULAR | Status: DC | PRN
  Administered 2024-05-02: .2 mg via INTRAVENOUS

## 2024-05-02 MED ORDER — SODIUM CHLORIDE 0.9 % IV SOLN: INTRAVENOUS | Status: DC

## 2024-05-02 MED ORDER — LIDOCAINE 2% (20 MG/ML) 5 ML SYRINGE
INTRAMUSCULAR | Status: DC | PRN
  Administered 2024-05-02: 60 mg via INTRAVENOUS

## 2024-05-02 MED ORDER — DIPHENHYDRAMINE HCL 25 MG PO CAPS
25.0000 mg | ORAL_CAPSULE | Freq: Four times a day (QID) | ORAL | Status: DC | PRN
  Filled 2024-05-02: qty 1

## 2024-05-02 MED ORDER — PROCHLORPERAZINE MALEATE 5 MG PO TABS: 5.0000 mg | ORAL_TABLET | Freq: Four times a day (QID) | ORAL | Status: DC | PRN

## 2024-05-02 MED ORDER — ATORVASTATIN CALCIUM 40 MG PO TABS: 40.0000 mg | ORAL_TABLET | Freq: Every day | ORAL | Status: DC

## 2024-05-02 MED ORDER — CLOPIDOGREL BISULFATE 75 MG PO TABS
75.0000 mg | ORAL_TABLET | Freq: Every day | ORAL | Status: DC
  Administered 2024-05-03 – 2024-05-17 (×15): 75 mg via ORAL
  Filled 2024-05-02 (×15): qty 1

## 2024-05-02 MED ORDER — PROCHLORPERAZINE EDISYLATE 10 MG/2ML IJ SOLN: 5.0000 mg | Freq: Four times a day (QID) | INTRAMUSCULAR | Status: DC | PRN

## 2024-05-02 MED ORDER — ALUM & MAG HYDROXIDE-SIMETH 200-200-20 MG/5ML PO SUSP: 30.0000 mL | ORAL | Status: DC | PRN

## 2024-05-02 MED ORDER — FLEET ENEMA RE ENEM: 1.0000 | ENEMA | Freq: Once | RECTAL | Status: DC | PRN

## 2024-05-02 MED ORDER — BISACODYL 10 MG RE SUPP: 10.0000 mg | Freq: Every day | RECTAL | Status: DC | PRN

## 2024-05-02 MED ORDER — ASPIRIN 81 MG PO TBEC
81.0000 mg | DELAYED_RELEASE_TABLET | Freq: Every day | ORAL | Status: DC
  Administered 2024-05-03 – 2024-05-16 (×14): 81 mg via ORAL
  Filled 2024-05-02 (×12): qty 1

## 2024-05-02 MED ORDER — PROPOFOL 500 MG/50ML IV EMUL
INTRAVENOUS | Status: DC | PRN
  Administered 2024-05-02: 50 ug/kg/min via INTRAVENOUS

## 2024-05-02 NOTE — H&P (Signed)
 Physical Medicine and Rehabilitation Admission H&P        Chief Complaint  Patient presents with   Functional decline due to stroke.       HPI:  Robert Everett. Robert Everett is an 89 year old RH-male with history of TIA, PE/DVT, fall w/Left orbital Fx with anisocoria '07, right shoulder surgery,  L3 compression Fx s/p kyphoplasty 10/25, who was admitted on 04/27/24 with left sided weakness and left facial droop which was noted on getting up in the morning. UDS negative. CTA was negative for LVO and CTP without mismatch. MRI brain done revealing acute infarct in right corona radiata and multiple remote infarcts including left superior frontal gyrus, right occipital lobe and cerebellum. Dr. Rosemarie felt that stroke was due to small vessel disease and recommended DAPT X 3 weeks followed by Plavix  alone.    BLE dopplers showed chronic DVT in left popliteal vein. 2D echo showed EF 60-65% with moderate to severe AR and was positive for IA shunt.  Dr. Claudene consulted and recommended [facility] X 2 to rule out endocarditis as well as TEE for work up which is to be done today. PT/OT consulted and has been working with patient. He required min assist for transfers and mobility with decrease in left foot clearance and slight posterior lean as well as mod to max assist for ADLs. He was independent PTA and CIR recommended due to functional decline.      Review of Systems  Constitutional:  Negative for chills and fever.  HENT:  Positive for hearing loss (hears better on the right).   Eyes:  Negative for blurred vision and double vision.  Respiratory:  Negative for cough and shortness of breath.   Cardiovascular:  Negative for chest pain and leg swelling.  Gastrointestinal:  Positive for constipation (hasn't had BM since 01/09).  Genitourinary:  Positive for hematuria. Negative for dysuria and urgency.       Gets up 3-4 times a night  Musculoskeletal:  Negative for back pain and myalgias.  Skin:  Negative for rash.   Neurological:  Positive for focal weakness. Negative for dizziness and sensory change.  Psychiatric/Behavioral:  The patient does not have insomnia.            Past Medical History:  Diagnosis Date   Arthritis      hands & shoulder    Cancer (HCC)      basal cell- nose   Diverticulosis     DVT (deep venous thrombosis) (HCC) 2007    treated with coumadin- x1    H/O exercise stress test 2000?    done following what they thought was an TIA, told all was wnll   Prostate enlargement      takes saw palmetto    Pulmonary embolism (HCC) 2007   Seasonal allergies      uses Flonase nasal spray   Stroke [facility])      TIA- 1990's               Past Surgical History:  Procedure Laterality Date   INSERTION OF MESH Bilateral 06/28/2014    Procedure: INSERTION OF MESH;  Surgeon: Vicenta Poli, MD;  Location: [facility] OR;  Service: General;  Laterality: Bilateral;   LAPAROSCOPIC INGUINAL HERNIA WITH UMBILICAL HERNIA Bilateral 06/28/2014    Procedure: LAPAROSCOPIC RIGHT AND LEFT  INGUINAL HERNIA WITH UMBILICAL HERNIA  AND MESH;  Surgeon: Vicenta Poli, MD;  Location: [facility] OR;  Service: General;  Laterality: Bilateral;   polyps removed  2010    on colonoscopy   SHOULDER SURGERY Right      rotator cuff repair , x2 on the same shoulder   TONSILLECTOMY              History reviewed. No pertinent family history.         Social History: Married. Independent without AD. He reports that he quit smoking about 53 years ago. His smoking use included cigarettes. He has never used smokeless tobacco. He reports that he does not drink alcohol and does not use drugs.     Allergies: [Allergies]  [Allergies] No Known Allergies           Medications Prior to Admission  Medication Sig Dispense Refill   aspirin  EC 81 MG tablet Take 81 mg by mouth at bedtime.        atorvastatin  (LIPITOR) 20 MG tablet Take 20 mg by mouth at bedtime.       diclofenac (VOLTAREN) 50 MG EC tablet Take 50 mg by mouth  2 (two) times daily.       Multiple Vitamin (MULTIVITAMIN PO) Take 1 tablet by mouth daily.       Saw Palmetto 450 MG CAPS Take 450 mg by mouth 2 (two) times daily.              Home: Home Living Family/patient expects to be discharged to:: Private residence Living Arrangements: Spouse/significant other Available Help at Discharge: Family, Available 24 hours/day Type of Home: House Home Access: Ramped entrance Home Layout: One level Bathroom Shower/Tub: Engineer, [employer] Systems: Handicapped height Bathroom Accessibility: Yes Home Equipment: Grab bars - tub/shower, Grab bars - toilet, Shower seat, Other (comment), Cane - single point Additional Comments: uses urinal at night  Lives With: Spouse   Functional History: Prior Function Prior Level of Function : Independent/Modified Independent, Driving Mobility Comments: normally ind; was using [facility] after back surgery in [place] but had gotten away from it. ADLs Comments: INd with ADLs/IADLs.   Functional Status:  Mobility: Bed Mobility Overal bed mobility: Needs Assistance Bed Mobility: Supine to Sit Supine to sit: Min assist, Used rails Sit to supine: Min assist General bed mobility comments: assist to bring LUE with him and to raise torso Transfers Overall transfer level: Needs assistance Equipment used: Rolling walker (2 wheels), 1 person hand held assist Transfers: Sit to/from Stand Sit to Stand: Min assist Bed to/from chair/wheelchair/BSC transfer type:: Step pivot Step pivot transfers: Mod assist General transfer comment: RW used with assistance for LUE grip to allow for increased stability; slight posterior lean initially Ambulation/Gait Ambulation/Gait assistance: Min assist Gait Distance (Feet): 120 Feet Assistive device: Rolling walker (2 wheels) Gait Pattern/deviations: Step-through pattern, Decreased step length - left, Decreased dorsiflexion - left, Drifts right/left General Gait Details: held Lt hand  on RW and assisted with all steering/turning of RW; vc for proximity to RW; more impaired Lt foot clearance and step length as he fatigued Gait velocity interpretation: <1.8 ft/sec, indicate of risk for recurrent falls Pre-gait activities: bilateral weight-shifts with 1UE support and assist to shift weight   ADL: ADL Overall ADL's : Needs assistance/impaired Eating/Feeding: Set up, Sitting Grooming: Wash/dry hands, Wash/dry face, Brushing hair, Minimal assistance, Sitting (assistance with combing hair) Upper Body Bathing: Moderate assistance, Sitting Upper Body Bathing Details (indicate cue type and reason): assistance for bathing RUE and back Lower Body Bathing: Moderate assistance, Maximal assistance, Sit to/from stand Lower Body Bathing Details (indicate cue type and reason): patient able to bathe peri area  back and upper legs while sitting and required max assist for peri area back while standing Upper Body Dressing : Moderate assistance, Sitting Upper Body Dressing Details (indicate cue type and reason): gown change Lower Body Dressing: Moderate assistance, Maximal assistance, Sit to/from stand, Sitting/lateral leans Toilet Transfer: Moderate assistance, Stand-pivot, BSC/3in1   Cognition: Cognition Overall Cognitive Status: No family/caregiver present to determine baseline cognitive functioning Arousal/Alertness: Awake/alert Orientation Level: Oriented X4 Attention: Sustained Sustained Attention: Appears intact Memory: Impaired Memory Impairment: Storage deficit, Retrieval deficit, Decreased recall of new information Awareness: Impaired Awareness Impairment: Emergent impairment Problem Solving: Impaired Problem Solving Impairment: Verbal basic Safety/Judgment: Impaired Cognition Arousal: Alert Behavior During Therapy: WFL for tasks assessed/performed Overall Cognitive Status: No family/caregiver present to determine baseline cognitive functioning     Blood pressure (!)  144/50, pulse 61, temperature (!) 97.5 F (36.4 C), temperature source Oral, resp. rate 20, height 5' 9 (1.753 m), weight 86.2 kg, SpO2 94%. Physical Exam Vitals and nursing note reviewed.  Constitutional:      Appearance: Normal appearance.  HENT:     Head: Normocephalic.     Nose: Nose normal.  Eyes:     Pupils: Pupils are equal, round, and reactive to light.  Cardiovascular:     Rate and Rhythm: Normal rate and regular rhythm.  Pulmonary:     Effort: Pulmonary effort is normal. No respiratory distress.     Breath sounds: No wheezing or rales.  Abdominal:     General: Bowel sounds are normal. There is no distension.     Tenderness: There is no abdominal tenderness.  Musculoskeletal:        General: No swelling.     Cervical back: Normal range of motion.  Skin:    General: Skin is warm and dry.     Comments: Multiple keratosis BUE and bilateral shins.   Neurological:     Mental Status: He is alert and oriented to person, place, and time.     Comments: Left facial weakness with minimal dysarthria. HOH--hears better on the right. He was able to answer orientation questions and biographic questions without difficulty.  Anisocoria L>R pupil and reports field cut right upper field (since trauma '07). Dense LUE  and mild LLE weakness. Decreased LT on left.   Psychiatric:     Comments: A little flat. cooperative       Lab Results Last 48 Hours  No results found for this or any previous visit (from the past 48 hours).   Imaging Results (Last 48 hours)  No results found.         Blood pressure (!) 144/50, pulse 61, temperature (!) 97.5 F (36.4 C), temperature source Oral, resp. rate 20, height 5' 9 (1.753 m), weight 86.2 kg, SpO2 94%.   Medical Problem List and Plan: 1. Functional deficits secondary to right corona radiata infarct             -patient may  shower             -ELOS/Goals: 10-12 days, supervision goals with PT, OT, SLP 2.  Chronic LLE DVT/  Antithrombotics: -DVT/anticoagulation:  Pharmaceutical: Lovenox              -antiplatelet therapy: DAPT X 3 weeks followed by Plavix  alone.  3. Chronic LBP/ Pain Management: Tylenol  prn.  4. Mood/Behavior/Sleep: LCSW to follow for evaluation and support             --Melatonin prn for insomnia.              -  antipsychotic agents: N/A 5. Neuropsych/cognition: This patient is capable of making decisions on his own behalf. 6. Skin/Wound Care: Routine pressure relief measures.  7. Fluids/Electrolytes/Nutrition: Monitor I/O. Check CMET in am  8. HTN: Permissive HTN for another 3 days to avoid hypoperfusion 9.  Thrombocytopenia: Was down to 134-->149 and recovering. Recheck in am.  10. Moderate to severe aortic regurgitation: BC X 2 negative so far. For TEE today.  11. Dyslipidemia: Now on increased dose of Liptor @ 40 mg.        Sharlet GORMAN Schmitz, PA-C 05/02/2024  I have personally performed a face to face diagnostic evaluation of this patient and formulated the key components of the plan.  Additionally, I have personally reviewed laboratory data, imaging studies, as well as relevant notes and concur with the physician assistant's documentation above.  The patient's status has not changed from the original H&P.  Any changes in documentation from the acute care chart have been noted above.  Arthea IVAR Gunther, MD, LEELLEN

## 2024-05-02 NOTE — Transfer of Care (Signed)
 Immediate Anesthesia Transfer of Care Note  Patient: Robert Everett  Procedure(s) Performed: TRANSESOPHAGEAL ECHOCARDIOGRAM  Patient Location: PACU and Cath Lab  Anesthesia Type:MAC  Level of Consciousness: awake, alert , and oriented  Airway & Oxygen Therapy: Patient Spontanous Breathing and Patient connected to face mask oxygen  Post-op Assessment: Report given to RN and Post -op Vital signs reviewed and stable  Post vital signs: Reviewed and stable  Last Vitals:  Vitals Value Taken Time  BP 112/42 05/02/24 15:36  Temp    Pulse 87 05/02/24 15:38  Resp 26 05/02/24 15:38  SpO2 97 % 05/02/24 15:38  Vitals shown include unfiled device data.  Last Pain:  Vitals:   05/02/24 1536  TempSrc:   PainSc: 0-No pain      Patients Stated Pain Goal: 2 (05/01/24 1118)  Complications: No notable events documented.

## 2024-05-02 NOTE — Plan of Care (Signed)
 Uneventful PM shift.  Problem: Education: Goal: Knowledge of disease or condition will improve Outcome: Progressing Goal: Knowledge of secondary prevention will improve (MUST DOCUMENT ALL) Outcome: Progressing Goal: Knowledge of patient specific risk factors will improve (DELETE if not current risk factor) Outcome: Progressing   Problem: Ischemic Stroke/TIA Tissue Perfusion: Goal: Complications of ischemic stroke/TIA will be minimized Outcome: Progressing   Problem: Coping: Goal: Will verbalize positive feelings about self Outcome: Progressing Goal: Will identify appropriate support needs Outcome: Progressing   Problem: Health Behavior/Discharge Planning: Goal: Ability to manage health-related needs will improve Outcome: Progressing Goal: Goals will be collaboratively established with patient/family Outcome: Progressing   Problem: Self-Care: Goal: Ability to participate in self-care as condition permits will improve Outcome: Progressing Goal: Verbalization of feelings and concerns over difficulty with self-care will improve Outcome: Progressing Goal: Ability to communicate needs accurately will improve Outcome: Progressing   Problem: Nutrition: Goal: Risk of aspiration will decrease Outcome: Progressing Goal: Dietary intake will improve Outcome: Progressing   Problem: Education: Goal: Knowledge of General Education information will improve Description: Including pain rating scale, medication(s)/side effects and non-pharmacologic comfort measures Outcome: Progressing   Problem: Health Behavior/Discharge Planning: Goal: Ability to manage health-related needs will improve Outcome: Progressing   Problem: Clinical Measurements: Goal: Ability to maintain clinical measurements within normal limits will improve Outcome: Progressing Goal: Will remain free from infection Outcome: Progressing Goal: Diagnostic test results will improve Outcome: Progressing Goal: Respiratory  complications will improve Outcome: Progressing Goal: Cardiovascular complication will be avoided Outcome: Progressing   Problem: Activity: Goal: Risk for activity intolerance will decrease Outcome: Progressing   Problem: Nutrition: Goal: Adequate nutrition will be maintained Outcome: Progressing   Problem: Coping: Goal: Level of anxiety will decrease Outcome: Progressing   Problem: Elimination: Goal: Will not experience complications related to bowel motility Outcome: Progressing Goal: Will not experience complications related to urinary retention Outcome: Progressing   Problem: Pain Managment: Goal: General experience of comfort will improve and/or be controlled Outcome: Progressing   Problem: Safety: Goal: Ability to remain free from injury will improve Outcome: Progressing   Problem: Skin Integrity: Goal: Risk for impaired skin integrity will decrease Outcome: Progressing

## 2024-05-02 NOTE — H&P (Signed)
 "   Physical Medicine and Rehabilitation Admission H&P    Chief Complaint  Patient presents with   Functional decline due to stroke.     HPI:  Robert Everett. Quaintance is an 89 year old RH-male with history of TIA, PE/DVT, fall w/Left orbital Fx with anisocoria '07, right shoulder surgery,  L3 compression Fx s/p kyphoplasty 10/25, who was admitted on 04/27/24 with left sided weakness and left facial droop which was noted on getting up in the morning. UDS negative. CTA was negative for LVO and CTP without mismatch. MRI brain done revealing acute infarct in right corona radiata and multiple remote infarcts including left superior frontal gyrus, right occipital lobe and cerebellum. Dr. Rosemarie felt that stroke was due to small vessel disease and recommended DAPT X 3 weeks followed by Plavix  alone.   BLE dopplers showed chronic DVT in left popliteal vein. 2D echo showed EF 60-65% with moderate to severe AR and was positive for IA shunt.  Dr. Claudene consulted and recommended [facility] X 2 to rule out endocarditis as well as TEE for work up which is to be done today. PT/OT consulted and has been working with patient. He required min assist for transfers and mobility with decrease in left foot clearance and slight posterior lean as well as mod to max assist for ADLs. He was independent PTA and CIR recommended due to functional decline.    Review of Systems  Constitutional:  Negative for chills and fever.  HENT:  Positive for hearing loss (hears better on the right).   Eyes:  Negative for blurred vision and double vision.  Respiratory:  Negative for cough and shortness of breath.   Cardiovascular:  Negative for chest pain and leg swelling.  Gastrointestinal:  Positive for constipation (hasn't had BM since 01/09).  Genitourinary:  Positive for hematuria. Negative for dysuria and urgency.       Gets up 3-4 times a night  Musculoskeletal:  Negative for back pain and myalgias.  Skin:  Negative for rash.  Neurological:   Positive for focal weakness. Negative for dizziness and sensory change.  Psychiatric/Behavioral:  The patient does not have insomnia.      Past Medical History:  Diagnosis Date   Arthritis    hands & shoulder    Cancer (HCC)    basal cell- nose   Diverticulosis    DVT (deep venous thrombosis) (HCC) 2007   treated with coumadin- x1    H/O exercise stress test 2000?   done following what they thought was an TIA, told all was wnll   Prostate enlargement    takes saw palmetto    Pulmonary embolism (HCC) 2007   Seasonal allergies    uses Flonase nasal spray   Stroke [facility])    TIA- 1990's    Past Surgical History:  Procedure Laterality Date   INSERTION OF MESH Bilateral 06/28/2014   Procedure: INSERTION OF MESH;  Surgeon: Vicenta Poli, MD;  Location: [facility] OR;  Service: General;  Laterality: Bilateral;   LAPAROSCOPIC INGUINAL HERNIA WITH UMBILICAL HERNIA Bilateral 06/28/2014   Procedure: LAPAROSCOPIC RIGHT AND LEFT  INGUINAL HERNIA WITH UMBILICAL HERNIA  AND MESH;  Surgeon: Vicenta Poli, MD;  Location: [facility] OR;  Service: General;  Laterality: Bilateral;   polyps removed   2010   on colonoscopy   SHOULDER SURGERY Right    rotator cuff repair , x2 on the same shoulder   TONSILLECTOMY      History reviewed. No pertinent family history.  Social History: Married. Independent without AD. He reports that he quit smoking about 53 years ago. His smoking use included cigarettes. He has never used smokeless tobacco. He reports that he does not drink alcohol and does not use drugs.   Allergies: Allergies[1]   Medications Prior to Admission  Medication Sig Dispense Refill   aspirin  EC 81 MG tablet Take 81 mg by mouth at bedtime.      atorvastatin  (LIPITOR) 20 MG tablet Take 20 mg by mouth at bedtime.     diclofenac (VOLTAREN) 50 MG EC tablet Take 50 mg by mouth 2 (two) times daily.     Multiple Vitamin (MULTIVITAMIN PO) Take 1 tablet by mouth daily.     Saw Palmetto 450 MG CAPS  Take 450 mg by mouth 2 (two) times daily.      Home: Home Living Family/patient expects to be discharged to:: Private residence Living Arrangements: Spouse/significant other Available Help at Discharge: Family, Available 24 hours/day Type of Home: House Home Access: Ramped entrance Home Layout: One level Bathroom Shower/Tub: Engineer, Manufacturing Systems: Handicapped height Bathroom Accessibility: Yes Home Equipment: Grab bars - tub/shower, Grab bars - toilet, Shower seat, Other (comment), Cane - single point Additional Comments: uses urinal at night  Lives With: Spouse   Functional History: Prior Function Prior Level of Function : Independent/Modified Independent, Driving Mobility Comments: normally ind; was using [facility] after back surgery in [place] but had gotten away from it. ADLs Comments: INd with ADLs/IADLs.  Functional Status:  Mobility: Bed Mobility Overal bed mobility: Needs Assistance Bed Mobility: Supine to Sit Supine to sit: Min assist, Used rails Sit to supine: Min assist General bed mobility comments: assist to bring LUE with him and to raise torso Transfers Overall transfer level: Needs assistance Equipment used: Rolling walker (2 wheels), 1 person hand held assist Transfers: Sit to/from Stand Sit to Stand: Min assist Bed to/from chair/wheelchair/BSC transfer type:: Step pivot Step pivot transfers: Mod assist General transfer comment: RW used with assistance for LUE grip to allow for increased stability; slight posterior lean initially Ambulation/Gait Ambulation/Gait assistance: Min assist Gait Distance (Feet): 120 Feet Assistive device: Rolling walker (2 wheels) Gait Pattern/deviations: Step-through pattern, Decreased step length - left, Decreased dorsiflexion - left, Drifts right/left General Gait Details: held Lt hand on RW and assisted with all steering/turning of RW; vc for proximity to RW; more impaired Lt foot clearance and step length as he  fatigued Gait velocity interpretation: <1.8 ft/sec, indicate of risk for recurrent falls Pre-gait activities: bilateral weight-shifts with 1UE support and assist to shift weight    ADL: ADL Overall ADL's : Needs assistance/impaired Eating/Feeding: Set up, Sitting Grooming: Wash/dry hands, Wash/dry face, Brushing hair, Minimal assistance, Sitting (assistance with combing hair) Upper Body Bathing: Moderate assistance, Sitting Upper Body Bathing Details (indicate cue type and reason): assistance for bathing RUE and back Lower Body Bathing: Moderate assistance, Maximal assistance, Sit to/from stand Lower Body Bathing Details (indicate cue type and reason): patient able to bathe peri area back and upper legs while sitting and required max assist for peri area back while standing Upper Body Dressing : Moderate assistance, Sitting Upper Body Dressing Details (indicate cue type and reason): gown change Lower Body Dressing: Moderate assistance, Maximal assistance, Sit to/from stand, Sitting/lateral leans Toilet Transfer: Moderate assistance, Stand-pivot, BSC/3in1  Cognition: Cognition Overall Cognitive Status: No family/caregiver present to determine baseline cognitive functioning Arousal/Alertness: Awake/alert Orientation Level: Oriented X4 Attention: Sustained Sustained Attention: Appears intact Memory: Impaired Memory Impairment: Storage deficit, Retrieval deficit,  Decreased recall of new information Awareness: Impaired Awareness Impairment: Emergent impairment Problem Solving: Impaired Problem Solving Impairment: Verbal basic Safety/Judgment: Impaired Cognition Arousal: Alert Behavior During Therapy: WFL for tasks assessed/performed Overall Cognitive Status: No family/caregiver present to determine baseline cognitive functioning   Blood pressure (!) 144/50, pulse 61, temperature (!) 97.5 F (36.4 C), temperature source Oral, resp. rate 20, height 5' 9 (1.753 m), weight 86.2 kg,  SpO2 94%. Physical Exam Vitals and nursing note reviewed.  Constitutional:      Appearance: Normal appearance.  HENT:     Head: Normocephalic.     Nose: Nose normal.  Eyes:     Pupils: Pupils are equal, round, and reactive to light.  Cardiovascular:     Rate and Rhythm: Normal rate and regular rhythm.  Pulmonary:     Effort: Pulmonary effort is normal. No respiratory distress.     Breath sounds: No wheezing or rales.  Abdominal:     General: Bowel sounds are normal. There is no distension.     Tenderness: There is no abdominal tenderness.  Musculoskeletal:        General: No swelling.     Cervical back: Normal range of motion.  Skin:    General: Skin is warm and dry.     Comments: Multiple keratosis BUE and bilateral shins.   Neurological:     Mental Status: He is alert and oriented to person, place, and time.     Comments: Left facial weakness with minimal dysarthria. HOH--hears better on the right. He was able to answer orientation questions and biographic questions without difficulty.  Anisocoria L>R pupil and reports field cut right upper field (since trauma '07). Dense LUE  and mild LLE weakness. Decreased LT on left.   Psychiatric:     Comments: A little flat. cooperative     No results found for this or any previous visit (from the past 48 hours). No results found.    Blood pressure (!) 144/50, pulse 61, temperature (!) 97.5 F (36.4 C), temperature source Oral, resp. rate 20, height 5' 9 (1.753 m), weight 86.2 kg, SpO2 94%.  Medical Problem List and Plan: 1. Functional deficits secondary to right corona radiata infarct  -patient may  shower  -ELOS/Goals: 10-12 days, supervision goals with PT, OT, SLP 2.  Chronic LLE DVT/ Antithrombotics: -DVT/anticoagulation:  Pharmaceutical: Lovenox   -antiplatelet therapy: DAPT X 3 weeks followed by Plavix  alone.  3. Chronic LBP/ Pain Management: Tylenol  prn.  4. Mood/Behavior/Sleep: LCSW to follow for evaluation and  support  --Melatonin prn for insomnia.   -antipsychotic agents: N/A 5. Neuropsych/cognition: This patient is capable of making decisions on his own behalf. 6. Skin/Wound Care: Routine pressure relief measures.  7. Fluids/Electrolytes/Nutrition: Monitor I/O. Check CMET in am  8. HTN: Permissive HTN for another 3 days to avoid hypoperfusion 9.  Thrombocytopenia: Was down to 134-->149 and recovering. Recheck in am.  10. Moderate to severe aortic regurgitation: BC X 2 negative so far. For TEE today.  11. Dyslipidemia: Now on increased dose of Liptor @ 40 mg.          Robert GORMAN Schmitz, PA-C 05/02/2024     [1] No Known Allergies  "

## 2024-05-02 NOTE — CV Procedure (Signed)
 INDICATIONS:   The patient is 89 years old white male with abnormal AV on transthoracic echocardiogram and EKG abnormality of 1st degree AV block. His blood cultures x 2 are negative for 3 days.  PROCEDURE:  Informed consent was discussed including risks, benefits and alternatives for the procedure.  Risks include, but are not limited to, cough, sore throat, vomiting, nausea, somnolence, esophageal and stomach trauma or perforation, bleeding, low blood pressure, aspiration, pneumonia, infection, trauma to the teeth and death.    Patient was given deep sedation.  The oropharynx was anesthetized with topical lidocaine .  The transesophageal probe was inserted in the esophagus and stomach and multiple views were obtained.  Agitated saline was used after the transesophageal probe was removed from the body.  The patient was kept under observation until the patient left the procedure room.  The patient left the procedure room in stable condition.   COMPLICATIONS:  There were no immediate complications.  FINDINGS:  LEFT VENTRICLE: The left ventricle is normal in structure and function.  Wall motion is normal.  No thrombus or masses seen in the left ventricle.  RIGHT VENTRICLE:  The right ventricle is normal in structure and function without any thrombus or masses.    LEFT ATRIUM:  The left atrium is mildly dilated without any thrombus or masses.  LEFT ATRIAL APPENDAGE:  The left atrial appendage is free of any thrombus or masses.  RIGHT ATRIUM:  The right atrium is free of any thrombus or masses.    ATRIAL SEPTUM:  The atrial septum is normal with ASD or PFO by sonicated saline injection.Bubbles seen with first beat suggesting interatrial shunt.  MITRAL VALVE:  The mitral valve is degenerative in structure and function with multi-jet  mild regurgitation, no masses, stenosis or vegetations.  TRICUSPID VALVE:  The tricuspid valve is normal in structure and function without regurgitation, masses,  stenosis or vegetations.  AORTIC VALVE:  The aortic valve is tricuspid with 3-4 fenestrations with moderate to severe regurgitation, no definite masses, stenosis or vegetations.  PULMONIC VALVE:  The pulmonic valve is normal in structure and function with at least one fenestration giving 2 jts of regurgitation, no masses, stenosis or vegetations.  AORTIC ARCH, ASCENDING AND DESCENDING AORTA:  The aorta had moderate atherosclerosis in the ascending and descending aorta.  The aortic arch was normal.   Superior Vena Cava : No thrombus or catheter.   Pulmonary Veins: Visible.   Pulmonary artery: visible and normal.   IMPRESSION:   1 Mild MR 2 Moderate to severe AI 3 Mild PI 4 PFO with interatrial shunt. 5 Preserved LV systolic function.  RECOMMENDATIONS:    Surgical referral.   Encourage medical treatment due to advanced age.

## 2024-05-02 NOTE — Consult Note (Signed)
 Ref: Robert Everett, Robert EverettAddie, MD (Inactive)   Subjective:  Awake. Patient has pounding of at times. VS stable. Blood cultures are negative so far. Patient is agreeable to TEE to evaluate AV and r/o endocarditis.  Objective:  Vital Signs in the last 24 hours: Temp:  [97.2 F (36.2 C)-99.7 F (37.6 C)] 98 F (36.7 C) (01/13 1409) Pulse Rate:  [49-76] 59 (01/13 1409) Cardiac Rhythm: Sinus bradycardia;Heart block (01/13 0701) Resp:  [18-28] 18 (01/13 1409) BP: (132-165)/(45-55) 165/50 (01/13 1409) SpO2:  [93 %-96 %] 94 % (01/13 1409) Weight:  [68.9 kg] 68.9 kg (01/13 1409)  Physical Exam: BP Readings from Last 1 Encounters:  05/02/24 (!) 165/50     Wt Readings from Last 1 Encounters:  05/02/24 68.9 kg    Weight change:  Body mass index is 21.81 kg/m. HEENT: [place]/AT, Eyes-Blue, Conjunctiva-Pink, Sclera-Non-icteric Neck: No JVD, No bruit, Trachea midline. Lungs:  Clear, Bilateral. Cardiac:  Regular rhythm, normal S1 and S2, no S3. III/VI systolic and II/VI diastolic murmur. Abdomen:  Soft, non-tender. BS present. Extremities:  No edema present. No cyanosis. No clubbing. CNS: AxOx3, Cranial nerves grossly intact, left sided weakness.  Skin: Warm and dry.   Intake/Output from previous day: 01/12 0701 - 01/13 0700 In: -  Out: 1225 [Urine:1225]    Lab Results: BMET    Component Value Date/Time   NA 139 04/30/2024 0216   NA 141 04/29/2024 0201   NA 141 04/28/2024 0148   K 4.5 04/30/2024 0216   K 4.1 04/29/2024 0201   K 3.7 04/28/2024 0148   CL 107 04/30/2024 0216   CL 108 04/29/2024 0201   CL 108 04/28/2024 0148   CO2 24 04/30/2024 0216   CO2 26 04/29/2024 0201   CO2 25 04/28/2024 0148   GLUCOSE 120 (H) 04/30/2024 0216   GLUCOSE 125 (H) 04/29/2024 0201   GLUCOSE 108 (H) 04/28/2024 0148   BUN 26 (H) 04/30/2024 0216   BUN 22 04/29/2024 0201   BUN 21 04/28/2024 0148   CREATININE 0.99 04/30/2024 0216   CREATININE 0.91 04/29/2024 0201   CREATININE 1.01 04/28/2024 0148    CALCIUM  9.3 04/30/2024 0216   CALCIUM  9.1 04/29/2024 0201   CALCIUM  8.8 (L) 04/28/2024 0148   GFRNONAA >60 04/30/2024 0216   GFRNONAA >60 04/29/2024 0201   GFRNONAA >60 04/28/2024 0148   GFRAA 73 (L) 07/05/2014 1710   GFRAA 77 (L) 06/22/2014 1557   CBC    Component Value Date/Time   WBC 8.4 04/30/2024 0216   RBC 4.09 (L) 04/30/2024 0216   HGB 13.3 04/30/2024 0216   HCT 39.0 04/30/2024 0216   PLT 149 (L) 04/30/2024 0216   MCV 95.4 04/30/2024 0216   MCH 32.5 04/30/2024 0216   MCHC 34.1 04/30/2024 0216   RDW 13.6 04/30/2024 0216   LYMPHSABS 2.0 04/29/2024 0201   MONOABS 0.7 04/29/2024 0201   EOSABS 0.4 04/29/2024 0201   BASOSABS 0.0 04/29/2024 0201   HEPATIC Function Panel Recent Labs    04/27/24 0930 04/28/24 0148 04/29/24 0201  PROT 6.4*  6.4* 5.4* 5.4*  ALBUMIN 3.9  3.9 3.3* 3.3*  AST 26  25 20 24   ALT 30  30 24 31   ALKPHOS 80  81 67 68  BILIDIR 0.3*  --   --   IBILI 0.6  --   --    HEMOGLOBIN A1C Lab Results  Component Value Date   MPG 119.76 04/27/2024   CARDIAC ENZYMES No results found for: CKTOTAL, CKMB, CKMBINDEX, TROPONINI BNP No  results for input(s): PROBNP in the last 8760 hours. TSH No results for input(s): TSH in the last 8760 hours. CHOLESTEROL Recent Labs    04/27/24 0930 04/28/24 0148  CHOL 150 124    Scheduled Meds:  [MAR Hold] aspirin  EC  81 mg Oral Daily   [MAR Hold] atorvastatin   40 mg Oral Daily   [MAR Hold] clopidogrel   75 mg Oral Daily   [MAR Hold] feeding supplement  237 mL Oral BID BM   [MAR Hold] heparin   5,000 Units Subcutaneous Q8H   [MAR Hold] multivitamin with minerals  1 tablet Oral Daily   Continuous Infusions:  sodium chloride      PRN Meds:.[MAR Hold] acetaminophen  **OR** [MAR Hold] acetaminophen  (TYLENOL ) oral liquid 160 mg/5 mL **OR** [MAR Hold] acetaminophen , [MAR Hold] senna-docusate  Assessment/Plan: Acute right parietal stroke Moderate to severe AV insufficiency H/O DVT H/O PE Moderate  protein calorie malnutrition Sinus rhythm with 1st degree AV block. HTN Dyslipidemia  Plan: TEE today. Patient understood procedure and risks and wants to proceed.   LOS: 5 days   Time spent including chart review, lab review, examination, discussion with patient/Doctor : 30 min   Salena Negri  MD  05/02/2024, 2:30 PM

## 2024-05-02 NOTE — Anesthesia Postprocedure Evaluation (Signed)
"   Anesthesia Post Note  Patient: MARQUEL POTTENGER  Procedure(s) Performed: TRANSESOPHAGEAL ECHOCARDIOGRAM     Patient location during evaluation: PACU Anesthesia Type: MAC Level of consciousness: awake and alert Pain management: pain level controlled Vital Signs Assessment: post-procedure vital signs reviewed and stable Respiratory status: spontaneous breathing, nonlabored ventilation, respiratory function stable and patient connected to nasal cannula oxygen Cardiovascular status: stable and blood pressure returned to baseline Postop Assessment: no apparent nausea or vomiting Anesthetic complications: no   No notable events documented.  Last Vitals:  Vitals:   05/02/24 1550 05/02/24 1629  BP: (!) 128/52 (!) 148/64  Pulse: 93 92  Resp: (!) 31 (!) 32  Temp:  (!) 36.3 C  SpO2: 93% 93%    Last Pain:  Vitals:   05/02/24 1629  TempSrc: Oral  PainSc:                  Kenyah Luba D Tavish Gettis      "

## 2024-05-02 NOTE — Progress Notes (Signed)
 Inpatient Rehabilitation Admission Medication Review by a Pharmacist  A complete drug regimen review was completed for this patient to identify any potential clinically significant medication issues.  High Risk Drug Classes Is patient taking? Indication by Medication  Antipsychotic Yes, as an intravenous medication Compazine - N/V  Anticoagulant Yes Lovenox - vte ppx  Antibiotic No   Opioid No   Antiplatelet Yes Asa, plavix - CVA ppx  Hypoglycemics/insulin No   Vasoactive Medication No   Chemotherapy No   Other Yes Lipitor- HLD Benadryl - itching Trazodone - sleep     Type of Medication Issue Identified Description of Issue Recommendation(s)  Drug Interaction(s) (clinically significant)     Duplicate Therapy     Allergy     No Medication Administration End Date     Incorrect Dose     Additional Drug Therapy Needed     Significant med changes from prior encounter (inform family/care partners about these prior to discharge).    Other  PTA med: Voltaren Restart PTA meds when and if necessary during CIR admission or at time of discharge, if warranted    Clinically significant medication issues were identified that warrant physician communication and completion of prescribed/recommended actions by midnight of the next day:  No   Time spent performing this drug regimen review (minutes):  30    Julio Zappia BS, PharmD, BCPS Clinical Pharmacist 05/02/2024 5:58 PM  Contact: [phone] after 3 PM

## 2024-05-02 NOTE — Progress Notes (Signed)
 PMR Admission Coordinator Pre-Admission Assessment   Patient: Robert Everett is an 89 y.o., male MRN: [id number] DOB: 1934-03-09 Height: 5' 9 (175.3 cm) Weight: 86.2 kg   Insurance Information HMO:   yes  PPO:      PCP:      IPA:      80/20:      OTHER:  PRIMARY:  [employer] Medicare     Policy#: [id number], Medicare: [id number]      Subscriber:  CM Name: [facility] Dept      Phone#: [phone]     Fax#: [phone] Pre-Cert#: [id number] I received auth for CIR from Heather  with [facility] for admit 05/01/24 through 05/08/24.  Updates due to 05/08/24 at fax listed above. Benefits:  Phone #: [phone]     Name: [id number] Eff Date: 04/20/2024 - still active Deductible: $0 (does not have deductible) OOP Max: $6,200 ($0 met)    CIR: $550/day co-pay for days 1-5, $0/day co-pay for days 6+  SNF: $0.00 Copayment per day for days 1-20; $218 Copayment per day for days 21-100 for Medicare-covered care/maximum 100 days/benefit period  Outpatient: $35 copay/visit Home Health:  100% coverage DME: 80% coverage; 20% co-insurance  Providers: in network  SECONDARY:       Policy#:      Phone#:    Artist:       Phone#:    The [employer] for patients in Inpatient Rehabilitation Facilities with attached Privacy Act Statement-Health Care Records was provided and verbally reviewed with: Patient   Emergency Contact Information Contact Information       Name Relation Home Work Mobile    [place] Spouse [phone]        [place] Daughter [phone]        Teven, Mittman     [phone]         Other Contacts   None on File        Current Medical History  Patient Admitting Diagnosis: CVA History of Present Illness: [name] is a 89 y.o. male with past medical history  of TIA, DVT, right shoulder surgeries, low back pain hyperlipidemia who was brought to the [facility] emergency room 04/27/24 with left-sided weakness wife at  bedside states that patient was normal yesterday and this morning when she was trying to get him out of bed noted that he could not move his left upper extremity.  ded weakness noted when his wife attempted to get him up in the morning.  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  CTA head and neck without significant stenosis or disease.  MRI brain with acute infarct in the posterior right corona radiata and multiple remote infarcts in the left frontal gyrus, right occipital lobe, and cerebellum along with mild to moderate chronic microvascular changes.  He was treated with aspirin  and Plavix , not a candidate for thrombolysis or thrombectomy due to out of window for intervention.  He was admitted to the telemetry service for continuous cardiac monitoring.  Hospitalization has been otherwise complicated by hypertension, intermittent bradycardia. PM&R was consulted to evaluate appropriateness for IPR admission.        Complete NIHSS TOTAL: 7   Patient's medical record from [facility] has been reviewed by the rehabilitation admission coordinator and physician.   Past Medical History      Past Medical History:  Diagnosis Date   Arthritis  hands & shoulder    Cancer (HCC)      basal cell- nose   Diverticulosis     DVT (deep venous thrombosis) (HCC) 2007    treated with coumadin- x1    H/O exercise stress test 2000?    done following what they thought was an TIA, told all was wnll   Prostate enlargement      takes saw palmetto    Pulmonary embolism (HCC) 2007   Seasonal allergies      uses Flonase nasal spray   Stroke [facility])      TIA- 1990's          Has the patient had major surgery during 100 days prior to admission? No   Family History   family history is not on file.   Current Medications [Current Medications]  [Current Medications]    Current Facility-Administered  Medications:    0.9 %  sodium chloride  infusion, , Intravenous, Continuous, Claudene, Ajay, MD   acetaminophen  (TYLENOL ) tablet 650 mg, 650 mg, Oral, Q4H PRN **OR** acetaminophen  (TYLENOL ) 160 MG/5ML solution 650 mg, 650 mg, Per Tube, Q4H PRN **OR** acetaminophen  (TYLENOL ) suppository 650 mg, 650 mg, Rectal, Q4H PRN, Tobie Mario GAILS, MD   aspirin  EC tablet 81 mg, 81 mg, Oral, Daily, Judithe, Erin C, NP, 81 mg at 05/01/24 1004   atorvastatin  (LIPITOR) tablet 40 mg, 40 mg, Oral, Daily, Patel, Ekta V, MD, 40 mg at 05/01/24 1004   clopidogrel  (PLAVIX ) tablet 75 mg, 75 mg, Oral, Daily, Lehner, Erin C, NP, 75 mg at 05/01/24 1004   feeding supplement (ENSURE PLUS HIGH PROTEIN) liquid 237 mL, 237 mL, Oral, BID BM, Perri DELENA Meliton Mickey., MD, 237 mL at 05/01/24 1319   heparin  injection 5,000 Units, 5,000 Units, Subcutaneous, Q8H, Tobie Mario GAILS, MD, 5,000 Units at 05/02/24 9367   multivitamin with minerals tablet 1 tablet, 1 tablet, Oral, Daily, Perri DELENA Meliton Mickey., MD, 1 tablet at 05/01/24 1004   senna-docusate (Senokot-S) tablet 1 tablet, 1 tablet, Oral, QHS PRN, Tobie Mario GAILS, MD    Patients Current Diet:  Diet Order                  Diet NPO time specified Except for: Sips with Meds  Diet effective now                         Precautions / Restrictions Precautions Precautions: Fall Restrictions Weight Bearing Restrictions Per Provider Order: No    Has the patient had 2 or more falls or a fall with injury in the past year? No   Prior Activity Level Community (5-7x/wk): Pt. active in the community PTA   Prior Functional Level Self Care: Did the patient need help bathing, dressing, using the toilet or eating? Independent   Indoor Mobility: Did the patient need assistance with walking from room to room (with or without device)? Independent   Stairs: Did the patient need assistance with internal or external stairs (with or without device)? Independent   Functional Cognition: Did the  patient need help planning regular tasks such as shopping or remembering to take medications? Independent   Patient Information Are you of Hispanic, Latino/a,or Spanish origin?: A. No, not of Hispanic, Latino/a, or Spanish origin What is your race?: A. White Do you need or want an interpreter to communicate with a doctor or health care staff?: 0. No   Patient's Response To:  Health Literacy and Transportation Is the patient able  to respond to health literacy and transportation needs?: Yes Health Literacy - How often do you need to have someone help you when you read instructions, pamphlets, or other written material from your doctor or pharmacy?: Never In the past 12 months, has lack of transportation kept you from medical appointments or from getting medications?: No In the past 12 months, has lack of transportation kept you from meetings, work, or from getting things needed for daily living?: No   Home Assistive Devices / Equipment Home Equipment: Grab bars - tub/shower, Grab bars - toilet, Shower seat, Other (comment), Cane - single point   Prior Device Use: Indicate devices/aids used by the patient prior to current illness, exacerbation or injury? Walker   Current Functional Level Cognition   Arousal/Alertness: Awake/alert Overall Cognitive Status: No family/caregiver present to determine baseline cognitive functioning Orientation Level: Oriented X4 Attention: Sustained Sustained Attention: Appears intact Memory: Impaired Memory Impairment: Storage deficit, Retrieval deficit, Decreased recall of new information Awareness: Impaired Awareness Impairment: Emergent impairment Problem Solving: Impaired Problem Solving Impairment: Verbal basic Safety/Judgment: Impaired    Extremity Assessment (includes Sensation/Coordination)   Upper Extremity Assessment: Right hand dominant, LUE deficits/detail LUE Deficits / Details: 2+/5 to the shoulder, no AROM noted to elbow, forearm, wrist or  hand. LUE Sensation: decreased light touch LUE Coordination: decreased fine motor, decreased gross motor  Lower Extremity Assessment: Defer to PT evaluation LLE Deficits / Details: 4-/5 strength LLE Sensation: decreased proprioception LLE Coordination: decreased gross motor     ADLs   Overall ADL's : Needs assistance/impaired Eating/Feeding: Set up, Sitting Grooming: Wash/dry hands, Wash/dry face, Brushing hair, Minimal assistance, Sitting (assistance with combing hair) Upper Body Bathing: Moderate assistance, Sitting Upper Body Bathing Details (indicate cue type and reason): assistance for bathing RUE and back Lower Body Bathing: Moderate assistance, Maximal assistance, Sit to/from stand Lower Body Bathing Details (indicate cue type and reason): patient able to bathe peri area back and upper legs while sitting and required max assist for peri area back while standing Upper Body Dressing : Moderate assistance, Sitting Upper Body Dressing Details (indicate cue type and reason): gown change Lower Body Dressing: Moderate assistance, Maximal assistance, Sit to/from stand, Sitting/lateral leans Toilet Transfer: Moderate assistance, Stand-pivot, BSC/3in1     Mobility   Overal bed mobility: Needs Assistance Bed Mobility: Sit to Supine Supine to sit: Min assist Sit to supine: Min assist General bed mobility comments: assist for raising LLE     Transfers   Overall transfer level: Needs assistance Equipment used: Rolling walker (2 wheels), 1 person hand held assist Transfers: Sit to/from Stand Sit to Stand: Min assist Bed to/from chair/wheelchair/BSC transfer type:: Step pivot Step pivot transfers: Mod assist General transfer comment: RW used with assistance for LUE grip to allow for increased stability     Ambulation / Gait / Stairs / Wheelchair Mobility   Ambulation/Gait Ambulation/Gait assistance: Mod assist Gait Distance (Feet): 60 Feet Assistive device: Rolling walker (2  wheels) Gait Pattern/deviations: Step-through pattern, Decreased step length - left, Decreased dorsiflexion - left, Drifts right/left General Gait Details: held Lt hand on RW and assisted with all steering/turning of RW Gait velocity interpretation: <1.8 ft/sec, indicate of risk for recurrent falls Pre-gait activities: bilateral weight-shifts with 1UE support and assist to shift weight     Posture / Balance Dynamic Sitting Balance Sitting balance - Comments: CGA for EOB due to posterior bias Balance Overall balance assessment: Needs assistance Sitting-balance support: Feet supported Sitting balance-Leahy Scale: Fair Sitting balance - Comments: CGA  for EOB due to posterior bias Postural control: Posterior lean Standing balance support: Single extremity supported, During functional activity, Reliant on assistive device for balance Standing balance-Leahy Scale: Poor Standing balance comment: reliant on external support due to posterior leaning     Special considerations/life events  Special service needs none     Previous Home Environment (from acute therapy documentation) Living Arrangements: Spouse/significant other  Lives With: Spouse Available Help at Discharge: Family, Available 24 hours/day Type of Home: House Home Layout: One level Home Access: Ramped entrance Bathroom Shower/Tub: Engineer[employer]: Handicapped height Bathroom Accessibility: Yes How Accessible: Accessible via walker Home Care Services: No Additional Comments: uses urinal at night   Discharge Living Setting Plans for Discharge Living Setting: Patient's home Type of Home at Discharge: House Discharge Home Layout: One level Discharge Home Access: Ramped entrance Discharge Bathroom Shower/Tub: Tub/shower unit Discharge Bathroom Toilet: Handicapped height Discharge Bathroom Accessibility: Yes How Accessible: Accessible via walker Does the patient have any problems obtaining your medications?:  No   Social/Family/Support Systems Patient Roles: Spouse Contact Information: [phone] Anticipated Caregiver: Arista Anticipated Caregiver's Contact Information: Wife 24/7 supervision to light min A Ability/Limitations of Caregiver: light min A Caregiver Availability: 24/7 Discharge Plan Discussed with Primary Caregiver: Yes Is Caregiver In Agreement with Plan?: Yes Does Caregiver/Family have Issues with Lodging/Transportation while Pt is in Rehab?: No   Goals Patient/Family Goal for Rehab: PT/OT/SLP Supervision Expected length of stay: 10-12 days Pt/Family Agrees to Admission and willing to participate: Yes Program Orientation Provided & Reviewed with Pt/Caregiver Including Roles  & Responsibilities: Yes   Decrease burden of Care through IP rehab admission:  Not anticipated   Possible need for SNF placement upon discharge: not anticipated   Patient Condition: I have reviewed medical records from [facility], spoken with CM, and patient and family member. I met with patient at the bedside for inpatient rehabilitation assessment.  Patient will benefit from ongoing PT, OT, and SLP, can actively participate in 3 hours of therapy a day 5 days of the week, and can make measurable gains during the admission.  Patient will also benefit from the coordinated team approach during an Inpatient Acute Rehabilitation admission.  The patient will receive intensive therapy as well as Rehabilitation physician, nursing, social worker, and care management interventions.  Due to safety, skin/wound care, disease management, medication administration, pain management, and patient education the patient requires 24 hour a day rehabilitation nursing.  The patient is currently min-mod A with mobility and basic ADLs.  Discharge setting and therapy post discharge at home with home health is anticipated.  Patient has agreed to participate in the Acute Inpatient Rehabilitation Program and will admit  today.   Preadmission Screen Completed By:  Leita KATHEE Kleine, 05/02/2024 10:39 AM ______________________________________________________________________   Discussed status with Dr. Babs on 05/02/24 at 900 and received approval for admission today.   Admission Coordinator:  Leita KATHEE Kleine, CCC-SLP, time 1030/Date 05/02/24    Assessment/Plan: Diagnosis: Right PCA infarct Does the need for close, 24 hr/day Medical supervision in concert with the patient's rehab needs make it unreasonable for this patient to be served in a less intensive setting? Yes Co-Morbidities requiring supervision/potential complications: prior CVA's, DVT, polyarthritis Due to bladder management, bowel management, safety, skin/wound care, disease management, medication administration, pain management, and patient education, does the patient require 24 hr/day rehab nursing? Yes Does the patient require coordinated care of a physician, rehab nurse, PT, OT, and SLP to address physical and functional  deficits in the context of the above medical diagnosis(es)? Yes Addressing deficits in the following areas: balance, endurance, locomotion, strength, transferring, bowel/bladder control, bathing, dressing, feeding, grooming, toileting, cognition, speech, and psychosocial support Can the patient actively participate in an intensive therapy program of at least 3 hrs of therapy 5 days a week? Yes The potential for patient to make measurable gains while on inpatient rehab is excellent Anticipated functional outcomes upon discharge from inpatient rehab: supervision PT, supervision OT, supervision SLP Estimated rehab length of stay to reach the above functional goals is: 10-12 days Anticipated discharge destination: Home 10. Overall Rehab/Functional Prognosis: excellent     MD Signature: Arthea IVAR Gunther, MD, [facility] Physical Medicine & Rehabilitation Medical Director Rehabilitation Services 05/02/2024

## 2024-05-02 NOTE — Anesthesia Preprocedure Evaluation (Addendum)
"                                    Anesthesia Evaluation  Patient identified by MRN, date of birth, ID band Patient awake    Reviewed: Allergy & Precautions, NPO status , Patient's Chart, lab work & pertinent test results  Airway Mallampati: II  TM Distance: >3 FB Neck ROM: Full    Dental  (+) Upper Dentures, Lower Dentures   Pulmonary former smoker   breath sounds clear to auscultation       Cardiovascular negative cardio ROS  Rhythm:Regular Rate:Normal     Neuro/Psych CVA    GI/Hepatic negative GI ROS, Neg liver ROS,,,  Endo/Other  negative endocrine ROS    Renal/GU negative Renal ROS     Musculoskeletal  (+) Arthritis ,    Abdominal   Peds  Hematology negative hematology ROS (+)   Anesthesia Other Findings   Reproductive/Obstetrics                              Anesthesia Physical Anesthesia Plan  ASA: 3  Anesthesia Plan: MAC   Post-op Pain Management: Minimal or no pain anticipated   Induction: Intravenous  PONV Risk Score and Plan: 0 and Propofol  infusion  Airway Management Planned: Natural Airway  Additional Equipment: None  Intra-op Plan:   Post-operative Plan:   Informed Consent: I have reviewed the patients History and Physical, chart, labs and discussed the procedure including the risks, benefits and alternatives for the proposed anesthesia with the patient or authorized representative who has indicated his/her understanding and acceptance.       Plan Discussed with: CRNA  Anesthesia Plan Comments:          Anesthesia Quick Evaluation  "

## 2024-05-02 NOTE — Discharge Summary (Addendum)
 Physician Discharge Summary  Robert Everett FMW:[id number] DOB: [birth year] DOA: 04/27/2024  PCP: Merilee, L.Addie, MD (Inactive)  Admit date: 04/27/2024 Discharge date: 05/02/2024  Time spent: 40 minutes  Recommendations for Outpatient Follow-up:  Follow outpatient CBC/CMP  Follow with neurology outpatient for stroke Follow with cardiology outpatient - follow with surgery for mod to severe aortic insufficiency  Discharge Diagnoses:  Principal Problem:   Acute CVA (cerebrovascular accident) [facility]) Active Problems:   Malnutrition of moderate degree   Discharge Condition: stable  Diet recommendation: heart healthy  Filed Weights   04/27/24 0934 05/02/24 1409  Weight: 86.2 kg 68.9 kg    History of present illness:   89 yo with hx TIA, VTE, dyslipidemia who presented with L sided weakness and was found to have Robert Everett R corona radiata stroke.    Neurology is following, now on DAPT.    S/p TEE.   Stable for discharge to CIR.  Hospital Course:  Assessment and Plan:  Acute CVA MRI with stroke in the posterior aspect of the R corona radiata.  Multiple remote infarcts including the L superior frontal gyrus, R occipital lobe, cerebellum. CTA head/neck without carotid stenosis, no significant vertebrobasilar or intracranial disease Echo with preserved EF, no RWMA, positive bubble study suggestive of interatrial shunt LE US  with chronic DVT LDL 72, A1c 5.8 Appreciate neurology recommendations - R corona radiata infarct - etiology likely small vessel disease - recommending DAPT for 3 weeks then plavix  alone.  Lipitor increased to 40 mg.   2nd Degree Heart Block Appreciate cardiology assistance Blood cx ng   Eccentric Aortic Regurgitation Likely moderate to severe S/p TEE with mild MR, moderate to severe AI, mild PI, PFO.  Follow with surgery outpatient.  Recommending medical management.   Chronic DVT Of L popliteal vein Noted  Hypertension  Permissive hypertension, not on any  home BP meds   Dyslipidemia Lipitor increased as above   Prediabetes A1c 5.8        Procedures:   TEE IMPRESSION:   1 Mild MR 2 Moderate to severe AI 3 Mild PI 4 PFO with interatrial shunt. 5 Preserved LV systolic function.   RECOMMENDATIONS:    Surgical referral.   Encourage medical treatment due to advanced age.  LE US  Summary:  RIGHT:      - There is no evidence of deep vein thrombosis in the lower extremity.    - No cystic structure found in the popliteal fossa.    LEFT:     - Findings consistent with chronic deep vein thrombosis involving the left  popliteal vein.    - No cystic structure found in the popliteal fossa.     Echo IMPRESSIONS     1. Left ventricular ejection fraction, by estimation, is 60 to 65%. The  left ventricle has normal function. The left ventricle has no regional  wall motion abnormalities. Left ventricular diastolic parameters are  consistent with Grade I diastolic  dysfunction (impaired relaxation).   2. Right ventricular systolic function is normal. The right ventricular  size is normal.   3. Left atrial size was mildly dilated.   4. The mitral valve is normal in structure. Trivial mitral valve  regurgitation. No evidence of mitral stenosis.   5. The aortic valve is tricuspid. There is mild calcification of the  aortic valve. Aortic valve regurgitation is moderate to severe. Aortic  valve sclerosis is present, with no evidence of aortic valve stenosis.   6. Aortic dilatation noted. There is borderline dilatation of  the  ascending aorta, measuring 38 mm.   7. Agitated saline contrast bubble study was positive with shunting  observed within 3-6 cardiac cycles suggestive of interatrial shunt.   Comparison(s): No prior Echocardiogram.   Conclusion(s)/Recommendation(s): Very eccentric aortic regurgitation,  likely moderate to severe. Could consider TEE or cardiac MRI for further  evaluation. Bubble study positive for  intraatrial shunt.     Consultations: Cardiology neurology  Discharge Exam: Vitals:   05/02/24 1550 05/02/24 1629  BP: (!) 128/52 (!) 148/64  Pulse: 93 92  Resp: (!) 31 (!) 32  Temp:  (!) 97.3 F (36.3 C)  SpO2: 93% 93%   No new complaints Discussed with daughter over the phone  General: No acute distress. Cardiovascular: RRR Lungs: unlabored Neurological: L sided weakness - flaccid LUE.  Antigravity to LLE. Extremities: No clubbing or cyanosis. No edema.   Noted tachypnea after procedure - seen after procedure, appears comfortable, no increased WOB, lungs CTAB - no complaints. Discharge Instructions   Discharge Instructions     Ambulatory referral to Neurology   Complete by: As directed    Follow up with stroke clinic NP at [facility] in about 4-6 weeks. Thanks.   Call MD for:  difficulty breathing, headache or visual disturbances   Complete by: As directed    Call MD for:  extreme fatigue   Complete by: As directed    Call MD for:  hives   Complete by: As directed    Call MD for:  persistant dizziness or light-headedness   Complete by: As directed    Call MD for:  persistant nausea and vomiting   Complete by: As directed    Call MD for:  redness, tenderness, or signs of infection (pain, swelling, redness, odor or green/yellow discharge around incision site)   Complete by: As directed    Call MD for:  severe uncontrolled pain   Complete by: As directed    Call MD for:  temperature >100.4   Complete by: As directed    Discharge instructions   Complete by: As directed    You were seen for Robert Everett stroke.  You've been seen by neurology and started on aspirin  and plavix  for 3 weeks.  After 3 weeks, you'll continue plavix  alone.   We've increased your lipitor to 40 mg daily.  Follow up with neurology as an outpatient.  You had an echo which showed aortic valve insufficiency.  You had Robert Everett transesophageal echo which showed moderate to severe aortic insufficiency, mild mitral  regurgitation, mild pulmonary insufficiency, and Robert Everett PFO.  Cardiology recommends surgery follow up to discuss your valvular abnormality.  We'll send you to CIR for rehab.  Return for new, recurrent, or worsening symptoms.  Please ask your PCP to request records from this hospitalization so they know what was done and what the next steps will be.   Increase activity slowly   Complete by: As directed       Allergies as of 05/02/2024   No Known Allergies      Medication List     STOP taking these medications    diclofenac 50 MG EC tablet Commonly known as: VOLTAREN       TAKE these medications    aspirin  EC 81 MG tablet Take 1 tablet (81 mg total) by mouth at bedtime for 15 days.   atorvastatin  40 MG tablet Commonly known as: LIPITOR Take 1 tablet (40 mg total) by mouth daily. Start taking on: May 03, 2024 What changed:  medication strength how much to take when to take this   clopidogrel  75 MG tablet Commonly known as: PLAVIX  Take 1 tablet (75 mg total) by mouth daily. Start taking on: May 03, 2024   MULTIVITAMIN PO Take 1 tablet by mouth daily.   Saw Palmetto 450 MG Caps Take 450 mg by mouth 2 (two) times daily.       Allergies[1]  Follow-up Information     [facility]. Schedule an appointment as soon as possible for Robert Everett visit in 1 month(s).   Specialty: Neurology Why: stroke clinic Contact information: [address] [place]  [phone]                 The results of significant diagnostics from this hospitalization (including imaging, microbiology, ancillary and laboratory) are listed below for reference.    Significant Diagnostic Studies: EP STUDY Result Date: 05/02/2024 See surgical note for result.  CT HEAD WO CONTRAST ( ) Result Date: 04/29/2024 EXAM: CT HEAD WITHOUT CONTRAST 04/29/2024 06:30:45 PM TECHNIQUE: CT of the head was performed without the  administration of intravenous contrast. Automated exposure control, iterative reconstruction, and/or weight based adjustment of the mA/kV was utilized to reduce the radiation dose to as low as reasonably achievable. COMPARISON: 04/27/2024 CLINICAL HISTORY: Mental status change, unknown cause. FINDINGS: BRAIN AND VENTRICLES: No acute hemorrhage. Evolving acute/subacute infarct in the right parietal lobe. Stable patchy low-attenuation in the supratentorial white matter. Stable encephalomalacia in the left frontal lobe. Enlarged ventricular system and sulcal spaces, proportionate, consistent with diffuse atrophy. No extra-axial collection. No mass effect or midline shift. ORBITS: Chronic left orbital floor fracture again visualized. SINUSES: No acute abnormality. SOFT TISSUES AND SKULL: No acute soft tissue abnormality. No skull fracture. IMPRESSION: 1. Evolving acute/subacute infarct in the right parietal lobe. 2. Stable patchy low-attenuation in the supratentorial white matter. 3. Stable encephalomalacia in the left frontal lobe. 4. Diffuse cerebral atrophy. Electronically signed by: Franky Stanford MD MD 04/29/2024 07:05 PM EST RP Workstation: [id number]   VAS US  LOWER EXTREMITY VENOUS (DVT) Result Date: 04/29/2024  Lower Venous DVT Study Patient Name:  Robert Everett  Date of Exam:   04/28/2024 Medical Rec #: [id number]        Accession #:    [id number] Date of Birth: 1934/03/02        Patient Gender: M Patient Age:   52 years Exam Location:  [facility] Procedure:      VAS US  LOWER EXTREMITY VENOUS (DVT) Referring Phys: DEVON SHAFER --------------------------------------------------------------------------------  Indications: Stroke. Other Indications: H/O TIA and DVT. Comparison Study: Previous study on 3.17.2016 Performing Technologist: Edilia Elden Appl  Examination Guidelines: Natalio Salois complete evaluation includes B-mode imaging, spectral Doppler, color Doppler, and power Doppler as needed of all accessible  portions of each vessel. Bilateral testing is considered an integral part of Terese Heier complete examination. Limited examinations for reoccurring indications may be performed as noted. The reflux portion of the exam is performed with the patient in reverse Trendelenburg.  +---------+---------------+---------+-----------+----------+--------------+ RIGHT    CompressibilityPhasicitySpontaneityPropertiesThrombus Aging +---------+---------------+---------+-----------+----------+--------------+ CFV      Full           Yes      Yes                                 +---------+---------------+---------+-----------+----------+--------------+ SFJ      Full  Yes      Yes                                 +---------+---------------+---------+-----------+----------+--------------+ FV Prox  Full                                                        +---------+---------------+---------+-----------+----------+--------------+ FV Mid   Full                                                        +---------+---------------+---------+-----------+----------+--------------+ FV DistalFull                                                        +---------+---------------+---------+-----------+----------+--------------+ PFV      Full                                                        +---------+---------------+---------+-----------+----------+--------------+ POP      Full           Yes      Yes                                 +---------+---------------+---------+-----------+----------+--------------+ PTV      Full                                                        +---------+---------------+---------+-----------+----------+--------------+ PERO     Full                                                        +---------+---------------+---------+-----------+----------+--------------+   +---------+---------------+---------+-----------+----------+--------------+ LEFT      CompressibilityPhasicitySpontaneityPropertiesThrombus Aging +---------+---------------+---------+-----------+----------+--------------+ CFV      Full           Yes      Yes                                 +---------+---------------+---------+-----------+----------+--------------+ SFJ      Full           Yes                                          +---------+---------------+---------+-----------+----------+--------------+ FV Prox  Full                                                        +---------+---------------+---------+-----------+----------+--------------+  FV Mid   Full                                                        +---------+---------------+---------+-----------+----------+--------------+ FV DistalFull                                                        +---------+---------------+---------+-----------+----------+--------------+ PFV      Full                                                        +---------+---------------+---------+-----------+----------+--------------+ POP      Partial        Yes      Yes                                 +---------+---------------+---------+-----------+----------+--------------+ PTV      Full                                                        +---------+---------------+---------+-----------+----------+--------------+ PERO     Full                                                        +---------+---------------+---------+-----------+----------+--------------+ Chronic deep vein thrombosis noted in the left popliteal vein at the distal portion.    Summary: RIGHT: - There is no evidence of deep vein thrombosis in the lower extremity.  - No cystic structure found in the popliteal fossa.  LEFT: - Findings consistent with chronic deep vein thrombosis involving the left popliteal vein.  - No cystic structure found in the popliteal fossa.  *See table(s) above for measurements and observations.  Electronically signed by Fonda Rim on 04/29/2024 at 8:35:56 AM.    Final    ECHOCARDIOGRAM COMPLETE Result Date: 04/27/2024    ECHOCARDIOGRAM REPORT   Patient Name:   Robert Everett Date of Exam: 04/27/2024 Medical Rec #:  [id number]       Height:       69.0 in Accession #:    [id number]      Weight:       190.0 lb Date of Birth:  07-06-1933       BSA:          2.022 m Patient Age:    89 years        BP:           174/41 mmHg Patient Gender: M               HR:           51 bpm. Exam Location:  Inpatient Procedure: 2D Echo, Cardiac Doppler, Color Doppler and Saline Contrast Bubble            Study (Both Spectral and Color Flow Doppler were utilized during            procedure). Indications:   Stroke I63.9  History:       Patient has no prior history of Echocardiogram examinations.                Stroke.  Sonographer:   Tinnie Gosling RDCS Referring      [phone] [name] Phys: IMPRESSIONS  1. Left ventricular ejection fraction, by estimation, is 60 to 65%. The left ventricle has normal function. The left ventricle has no regional wall motion abnormalities. Left ventricular diastolic parameters are consistent with Grade I diastolic dysfunction (impaired relaxation).  2. Right ventricular systolic function is normal. The right ventricular size is normal.  3. Left atrial size was mildly dilated.  4. The mitral valve is normal in structure. Trivial mitral valve regurgitation. No evidence of mitral stenosis.  5. The aortic valve is tricuspid. There is mild calcification of the aortic valve. Aortic valve regurgitation is moderate to severe. Aortic valve sclerosis is present, with no evidence of aortic valve stenosis.  6. Aortic dilatation noted. There is borderline dilatation of the ascending aorta, measuring 38 mm.  7. Agitated saline contrast bubble study was positive with shunting observed within 3-6 cardiac cycles suggestive of interatrial shunt. Comparison(s): No prior Echocardiogram.  Conclusion(s)/Recommendation(s): Very eccentric aortic regurgitation, likely moderate to severe. Could consider TEE or cardiac MRI for further evaluation. Bubble study positive for intraatrial shunt. FINDINGS  Left Ventricle: Left ventricular ejection fraction, by estimation, is 60 to 65%. The left ventricle has normal function. The left ventricle has no regional wall motion abnormalities. The left ventricular internal cavity size was normal in size. There is  borderline left ventricular hypertrophy. Left ventricular diastolic parameters are consistent with Grade I diastolic dysfunction (impaired relaxation). Right Ventricle: The right ventricular size is normal. Right vetricular wall thickness was not well visualized. Right ventricular systolic function is normal. Left Atrium: Left atrial size was mildly dilated. Right Atrium: Right atrial size was not well visualized. Pericardium: There is no evidence of pericardial effusion. Mitral Valve: The mitral valve is normal in structure. Trivial mitral valve regurgitation. No evidence of mitral valve stenosis. Tricuspid Valve: The tricuspid valve is normal in structure. Tricuspid valve regurgitation is trivial. No evidence of tricuspid stenosis. Aortic Valve: While 5 chamber apical views show AR filling the LVOT, this is very eccentric, and on other views this appears less severe. The aortic valve is tricuspid. There is mild calcification of the aortic valve. Aortic valve regurgitation is moderate to severe. Aortic valve sclerosis is present, with no evidence of aortic valve stenosis. Pulmonic Valve: The pulmonic valve was not well visualized. Pulmonic valve regurgitation is trivial. No evidence of pulmonic stenosis. Aorta: Aortic dilatation noted. There is borderline dilatation of the ascending aorta, measuring 38 mm. Venous: The inferior vena cava was not well visualized. IAS/Shunts: The atrial septum is grossly normal. Agitated saline contrast was given intravenously  to evaluate for intracardiac shunting. Agitated saline contrast bubble study was positive with shunting observed within 3-6 cardiac cycles suggestive of interatrial shunt.  LEFT VENTRICLE PLAX 2D LVIDd:         5.30 cm      Diastology LVIDs:         3.50 cm      LV e'  lateral:   3.59 cm/s LV PW:         1.10 cm      LV E/e' lateral: 10.7 LV IVS:        1.10 cm LVOT diam:     2.40 cm LV SV:         171 LV SV Index:   84 LVOT Area:     4.52 cm  LV Volumes (MOD) LV vol d, MOD A2C: 185.0 ml LV vol d, MOD A4C: 187.0 ml LV vol s, MOD A2C: 71.8 ml LV vol s, MOD A4C: 71.8 ml LV SV MOD A2C:     113.2 ml LV SV MOD A4C:     187.0 ml LV SV MOD BP:      112.6 ml RIGHT VENTRICLE RV S prime:     14.70 cm/s TAPSE (M-mode): 1.6 cm LEFT ATRIUM             Index LA diam:        5.00 cm 2.47 cm/m LA Vol (A2C):   44.3 ml 21.91 ml/m LA Vol (A4C):   64.6 ml 31.95 ml/m LA Biplane Vol: 55.3 ml 27.35 ml/m  AORTIC VALVE LVOT Vmax:   138.00 cm/s LVOT Vmean:  96.100 cm/s LVOT VTI:    0.377 m  AORTA Ao Root diam: 3.60 cm Ao Asc diam:  3.80 cm MITRAL VALVE MV Area (PHT): 3.60 cm    SHUNTS MV Decel Time: 211 msec    Systemic VTI:  0.38 m MV E velocity: 38.30 cm/s  Systemic Diam: 2.40 cm MV Lorenzo Arscott velocity: 66.60 cm/s MV E/Duwayne Matters ratio:  0.58 Shelda Bruckner MD Electronically signed by Shelda Bruckner MD Signature Date/Time: 04/27/2024/8:17:27 PM    Final    MR BRAIN WO CONTRAST Result Date: 04/27/2024 EXAM: MRI BRAIN WITHOUT CONTRAST 04/27/2024 12:09:00 PM TECHNIQUE: Multiplanar multisequence MRI of the head/brain was performed without the administration of intravenous contrast. COMPARISON: Same day CT head and CTA head and neck. CLINICAL HISTORY: Stroke, follow up. FINDINGS: BRAIN AND VENTRICLES: There is Hazaiah Edgecombe 1.7 cm focus of restricted diffusion in the posterior aspect of the right corona radiata compatible with an acute infarct. Encephalomalacia and gliosis in the left superior frontal gyrus compatible with remote infarct. There are  multiple additional remote infarcts in the cerebellum, right more than left. Additional remote cortical infarct in the right occipital lobe. T2 and FLAIR hyperintensity in the periventricular and subcortical white matter suggestive of mild to moderate chronic microvascular ischemic changes. Mild parietal predominant age related volume loss. Chronic microhemorrhage in the left temporal lobe. No acute intracranial hemorrhage. No mass. No midline shift. No hydrocephalus. The sella is unremarkable. Normal flow voids. ORBITS: Bilateral lens replacement. SINUSES AND MASTOIDS: No significant abnormality. BONES AND SOFT TISSUES: Normal marrow signal. No significant soft tissue abnormality. IMPRESSION: 1. Acute infarct in the posterior aspect of the right corona radiata. 2. Multiple remote infarcts including the left superior frontal gyrus, right occipital lobe, and cerebellum. 3. Mild to moderate chronic microvascular ischemic changes. Electronically signed by: Donnice Mania MD MD 04/27/2024 02:13 PM EST RP Workstation: [id number]   CT ANGIO HEAD NECK W WO CM W PERF (CODE STROKE) Addendum Date: 04/27/2024 ADDENDUM REPORT: 04/27/2024 10:40 ADDENDUM: Phylis Javed perfusion study was also obtained. The study is normal. No perfusion deficit identified. Electronically Signed   By: Nancyann Burns M.D.   On: 04/27/2024 10:40   Result Date: 04/27/2024 CLINICAL DATA:  Left arm weakness EXAM: CT ANGIOGRAPHY HEAD AND NECK WITH CONTRAST TECHNIQUE:  Multidetector CT imaging of the head and neck was performed using the standard protocol during bolus administration of intravenous contrast. Multiplanar CT image reconstructions and MIPs were obtained to evaluate the vascular anatomy. Carotid stenosis measurements (when applicable) are obtained utilizing NASCET criteria, using the distal internal carotid diameter as the denominator. RADIATION DOSE REDUCTION: This exam was performed according to the departmental dose-optimization program which includes  automated exposure control, adjustment of the mA and/or kV according to patient size and/or use of iterative reconstruction technique. CONTRAST:  OMNIPAQUE  IOHEXOL  350 MG/ML SOLN COMPARISON:  None Available. CTA NECK: CTA NECK Aortic arch: No proximal vessel stenosis. Right carotid: Normal Left carotid: Normal Right vertebral: Normal Left vertebral: Normal Soft tissues: No significant abnormality Other comments: None CTA HEAD: CTA HEAD Right anterior circulation: The internal carotid artery is patent without significant stenosis. The anterior and middle cerebral arteries are patent without significant stenosis or proximal branch occlusion. No aneurysm. Left anterior circulation: The internal carotid artery is patent without significant stenosis. The anterior and middle cerebral arteries are patent without significant stenosis or proximal branch occlusion. No aneurysm. Posterior circulation: Both vertebral arteries are patent. There is no significant basilar stenosis. Both posterior cerebral arteries are patent without significant stenosis or proximal branch occlusion. No aneurysm. IMPRESSION: No carotid stenosis on either side No significant vertebrobasilar or intracranial disease Electronically Signed: By: Nancyann Burns M.D. On: 04/27/2024 09:57   CT HEAD CODE STROKE WO CONTRAST Result Date: 04/27/2024 EXAM: CT HEAD WITHOUT CONTRAST 04/27/2024 09:37:00 AM TECHNIQUE: CT of the head was performed without the administration of intravenous contrast. Automated exposure control, iterative reconstruction, and/or weight based adjustment of the mA/kV was utilized to reduce the radiation dose to as low as reasonably achievable. COMPARISON: Brain MRI 01/06/2006, Head CT 10/20/2005. CLINICAL HISTORY: 89 year old male with acute neurological deficit, stroke suspected. FINDINGS: BRAIN AND VENTRICLES: No acute hemorrhage. No evidence of acute infarct. No hydrocephalus. No extra-axial collection. No mass effect or midline  shift. Chronic encephalomalacia anterior left superior frontal gyrus, new since 2007. Patchy and confluent but more age indeterminate hypodensity in the inferior right parietal lobe (coronal image 65). Small but circumscribed chronic appearing cerebellar infarcts. Mild for age patchy cerebral white matter hypodensity otherwise. No suspicious intracranial vascular hyperdensity. Calcified atherosclerosis at the skull base. ORBITS: Chronic left orbital floor fracture. SINUSES: Paranasal sinuses, tympanic cavities, and mastoids are well aerated. SOFT TISSUES AND SKULL: No acute soft tissue abnormality. No skull fracture. ALBERTA STROKE PROGRAM EARLY CT SCORE (ASPECTS): Ganglionic (caudate, IC, lentiform nucleus, insula, M1-M3): 7. Supraganglionic (M4-M6): 3. Total: 10. IMPRESSION: 1. Age indeterminate right PCA territory infarct. Otherwise, chronic findings (including left MCA and bilateral cerebellar artery territories). ASPECTS 10. 2. These results were communicated to Dr. JUDITHANN Raddle at [phone] hours on 04/27/2024 by text page via the [facility] messaging system. Electronically signed by: Helayne Hurst MD MD 04/27/2024 09:45 AM EST RP Workstation: [id number]    Microbiology: Recent Results (from the past 240 hours)  Culture, blood (Routine X 2) w Reflex to ID Panel     Status: None (Preliminary result)   Collection Time: 04/29/24  9:19 PM   Specimen: BLOOD LEFT HAND  Result Value Ref Range Status   Specimen Description BLOOD LEFT HAND  Final   Special Requests   Final    BOTTLES DRAWN AEROBIC AND ANAEROBIC Blood Culture adequate volume   Culture   Final    NO GROWTH 3 DAYS Performed at [facility], [address],  [place], [place] [address]    Report Status PENDING  Incomplete  Culture, blood (Routine X 2) w Reflex to ID Panel     Status: None (Preliminary result)   Collection Time: 04/29/24  9:22 PM   Specimen: BLOOD RIGHT HAND  Result Value Ref Range Status   Specimen Description BLOOD RIGHT HAND   Final   Special Requests   Final    BOTTLES DRAWN AEROBIC AND ANAEROBIC Blood Culture adequate volume   Culture   Final    NO GROWTH 3 DAYS Performed at [facility], [address], [place], [place] [address]    Report Status PENDING  Incomplete     Labs: Basic Metabolic Panel: Recent Labs  Lab 04/27/24 0930 04/27/24 0938 04/28/24 0148 04/29/24 0201 04/30/24 0216  NA 142 144 141 141 139  K 3.8 3.8 3.7 4.1 4.5  CL 109 107 108 108 107  CO2 24  --  25 26 24   GLUCOSE 112* 112* 108* 125* 120*  BUN 20 23 21 22  26*  CREATININE 0.96 1.00 1.01 0.91 0.99  CALCIUM  9.2  --  8.8* 9.1 9.3  MG  --   --  2.0 2.0 2.1  PHOS  --   --  3.3 3.3 3.6   Liver Function Tests: Recent Labs  Lab 04/27/24 0930 04/28/24 0148 04/29/24 0201  AST 26  25 20 24   ALT 30  30 24 31   ALKPHOS 80  81 67 68  BILITOT 1.0  1.0 0.8 0.7  PROT 6.4*  6.4* 5.4* 5.4*  ALBUMIN 3.9  3.9 3.3* 3.3*   No results for input(s): LIPASE, AMYLASE in the last 168 hours. No results for input(s): AMMONIA in the last 168 hours. CBC: Recent Labs  Lab 04/27/24 0930 04/27/24 0938 04/28/24 0148 04/29/24 0201 04/30/24 0216  WBC 6.0  --  7.6 7.2 8.4  NEUTROABS 3.5  --  4.5 4.1  --   HGB 14.3 14.3 13.0 12.8* 13.3  HCT 42.3 42.0 38.6* 37.1* 39.0  MCV 96.8  --  96.0 95.4 95.4  PLT 154  --  134* 141* 149*   Cardiac Enzymes: No results for input(s): CKTOTAL, CKMB, CKMBINDEX, TROPONINI in the last 168 hours. BNP: BNP (last 3 results) No results for input(s): BNP in the last 8760 hours.  ProBNP (last 3 results) No results for input(s): PROBNP in the last 8760 hours.  CBG: Recent Labs  Lab 04/27/24 0930  GLUCAP 104*       Signed:  Meliton Monte MD.  [facility] 05/02/2024, 4:47 PM       [1] No Known Allergies

## 2024-05-02 NOTE — Progress Notes (Signed)
 Physical Therapy Treatment Patient Details Name: Robert Everett MRN: [id number] DOB: [birth year] Today's Date: 05/02/2024   History of Present Illness Pt is 89 yo presenting to [facility] on 1/8 due to L arm/leg weakness, L facial droop. MRI brain showed stroke in the posterior aspect of the R corona radiata. Multiple remote infarcts including the L superior frontal gyrus, R occipital lobe, cerebellum. PMH: TIA, DVT, BCC, R shoulder arthritis    PT Comments  Patient eager to work with therapy and get OOB. Requires up to min assist for bed mobility due to LUE left behind and posterior lean on initial sitting. Transfers with min assist to support LUE on RW and due to posterior lean upon standing. Ambulated incr distance compared to 1/12 (up to 120 ft), however with noticeable incr fatigue of LLE over final 40 ft. Assist to hold LUE on RW, however pt with some ability to stabilize LUE on RW this date.     If plan is discharge home, recommend the following: Help with stairs or ramp for entrance;Assist for transportation;Assistance with cooking/housework;A lot of help with walking and/or transfers;A lot of help with bathing/dressing/bathroom   Can travel by private vehicle        Equipment Recommendations  Wheelchair cushion (measurements PT);Wheelchair (measurements PT)    Recommendations for Other Services       Precautions / Restrictions Precautions Precautions: Fall Recall of Precautions/Restrictions: Intact Restrictions Weight Bearing Restrictions Per Provider Order: No     Mobility  Bed Mobility Overal bed mobility: Needs Assistance Bed Mobility: Supine to Sit     Supine to sit: Min assist, Used rails     General bed mobility comments: assist to bring LUE with him and to raise torso    Transfers Overall transfer level: Needs assistance Equipment used: Rolling walker (2 wheels), 1 person hand held assist Transfers: Sit to/from Stand Sit to Stand: Min assist            General transfer comment: RW used with assistance for LUE grip to allow for increased stability; slight posterior lean initially    Ambulation/Gait Ambulation/Gait assistance: Min assist Gait Distance (Feet): 120 Feet Assistive device: Rolling walker (2 wheels) Gait Pattern/deviations: Step-through pattern, Decreased step length - left, Decreased dorsiflexion - left, Drifts right/left   Gait velocity interpretation: <1.8 ft/sec, indicate of risk for recurrent falls   General Gait Details: held Lt hand on RW and assisted with all steering/turning of RW; vc for proximity to RW; more impaired Lt foot clearance and step length as he fatigued   Stairs             Wheelchair Mobility     Tilt Bed    Modified Rankin (Stroke Patients Only) Modified Rankin (Stroke Patients Only) Pre-Morbid Rankin Score: No symptoms Modified Rankin: Moderately severe disability     Balance Overall balance assessment: Needs assistance Sitting-balance support: Feet supported Sitting balance-Leahy Scale: Fair Sitting balance - Comments: CGA for EOB due to posterior bias Postural control: Posterior lean Standing balance support: During functional activity, Reliant on assistive device for balance, Bilateral upper extremity supported Standing balance-Leahy Scale: Poor Standing balance comment: reliant on external support due to posterior leaning                            Communication Communication Communication: Impaired Factors Affecting Communication: Hearing impaired;Difficulty expressing self;Reduced clarity of speech  Cognition Arousal: Alert Behavior During Therapy: [facility] for tasks assessed/performed  PT - Cognitive impairments: No apparent impairments                         Following commands: Intact      Cueing Cueing Techniques: Verbal cues, Visual cues  Exercises      General Comments General comments (skin integrity, edema, etc.): HR 58-67       Pertinent Vitals/Pain Pain Assessment Pain Assessment: No/denies pain    Home Living                          Prior Function            PT Goals (current goals can now be found in the care plan section) Acute Rehab PT Goals Patient Stated Goal: improve mobility and return to PLOF Time For Goal Achievement: 05/11/24 Potential to Achieve Goals: Good Progress towards PT goals: Progressing toward goals    Frequency    Min 3X/week      PT Plan      Co-evaluation              AM-PAC PT 6 Clicks Mobility   Outcome Measure  Help needed turning from your back to your side while in a flat bed without using bedrails?: A Little Help needed moving from lying on your back to sitting on the side of a flat bed without using bedrails?: A Little Help needed moving to and from a bed to a chair (including a wheelchair)?: A Little Help needed standing up from a chair using your arms (e.g., wheelchair or bedside chair)?: A Little Help needed to walk in hospital room?: A Little Help needed climbing 3-5 steps with a railing? : Total 6 Click Score: 16    End of Session Equipment Utilized During Treatment: Gait belt Activity Tolerance: Patient limited by fatigue Patient left: with call bell/phone within reach;in chair;with chair alarm set Nurse Communication: Mobility status PT Visit Diagnosis: Unsteadiness on feet (R26.81);Other abnormalities of gait and mobility (R26.89);Hemiplegia and hemiparesis Hemiplegia - Right/Left: Left Hemiplegia - dominant/non-dominant: Non-dominant Hemiplegia - caused by: Cerebral infarction     Time: 8950-8891 PT Time Calculation (min) (ACUTE ONLY): 19 min  Charges:    $Gait Training: 8-22 mins PT General Charges $$ ACUTE PT VISIT: 1 Visit                      [name], PT Acute Rehabilitation Services  Office [phone]    Macario SHAUNNA Soja 05/02/2024, 11:16 AM

## 2024-05-02 NOTE — Progress Notes (Signed)
 "  Physical Medicine and Rehabilitation Consult Reason for Consult: Evaluate appropriateness for Inpatient Rehab Referring Physician: Dr. Perri       HPI: Robert Everett is a 89 y.o. male with PMHx of  has a past medical history of Arthritis, Cancer (HCC), Diverticulosis, DVT (deep venous thrombosis) (HCC) (2007), H/O exercise stress test (2000?), Prostate enlargement, Pulmonary embolism (HCC) (2007), Seasonal allergies, and Stroke (HCC). . [facility] for new left-sided weakness noted when his wife attempted to get him up in the morning.  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  CTA head and neck without significant stenosis or disease.  MRI brain with acute infarct in the posterior right corona radiata and multiple remote infarcts in the left frontal gyrus, right occipital lobe, and cerebellum along with mild to moderate chronic microvascular changes.  He was treated with aspirin  and Plavix , not a candidate for thrombolysis or thrombectomy due to out of window for intervention.  He was admitted to the telemetry service for continuous cardiac monitoring.  Hospitalization has been otherwise complicated by hypertension, intermittent bradycardia. PM&R was consulted to evaluate appropriateness for IPR admission.    Per chart review, prior to admission he was living in a 1 level home with a ramped entrance with his wife who is available 24-7 at discharge.  He is typically independent although did use a 6 point cane for short amount of time after back surgery in October.  He is independent of ADLs and IADLs.  His wife can provide supervision level assistance but no physical assistance.  Currently, he is mod assist for upper body ADLs, max assist for lower body ADLs, and moderate assist for toileting with stand pivot transfer.  He is min assist for supine to sit and sit to stand, min  assist for bed mobility due to difficulty with balance and left-sided weakness, and unable to perform steps or march in place due to left lower extremity buckling.    Review of Systems  Constitutional:  Negative for chills and fever.  HENT:  Positive for hearing loss.   Eyes:  Negative for blurred vision and double vision.  Respiratory:  Negative for cough and shortness of breath.   Cardiovascular:  Negative for chest pain and palpitations.  Gastrointestinal:  Negative for constipation, diarrhea, nausea and vomiting.  Genitourinary:  Negative for dysuria and urgency.  Musculoskeletal:  Positive for back pain.  Neurological:  Positive for focal weakness. Negative for dizziness, tingling and headaches.  Psychiatric/Behavioral:  Negative for depression. The patient is nervous/anxious. The patient does not have insomnia.        Past Medical History:  Diagnosis Date   Arthritis      hands & shoulder    Cancer (HCC)      basal cell- nose   Diverticulosis     DVT (deep venous thrombosis) (HCC) 2007    treated with coumadin- x1    H/O exercise stress test 2000?    done following what they thought was an TIA, told all was wnll   Prostate enlargement      takes saw palmetto    Pulmonary embolism (HCC) 2007   Seasonal allergies      uses Flonase nasal spray   Stroke [facility])      TIA- 1990's             Past Surgical History:  Procedure Laterality Date  INSERTION OF MESH Bilateral 06/28/2014    Procedure: INSERTION OF MESH;  Surgeon: Vicenta Poli, MD;  Location: [facility] OR;  Service: General;  Laterality: Bilateral;   LAPAROSCOPIC INGUINAL HERNIA WITH UMBILICAL HERNIA Bilateral 06/28/2014    Procedure: LAPAROSCOPIC RIGHT AND LEFT  INGUINAL HERNIA WITH UMBILICAL HERNIA  AND MESH;  Surgeon: Vicenta Poli, MD;  Location: [facility] OR;  Service: General;  Laterality: Bilateral;   polyps removed    2010    on colonoscopy   SHOULDER SURGERY Right      rotator cuff repair , x2 on the same shoulder    TONSILLECTOMY            History reviewed. No pertinent family history.     Social History:  reports that he quit smoking about 53 years ago. His smoking use included cigarettes. He has never used smokeless tobacco. He reports that he does not drink alcohol and does not use drugs. Allergies: [Allergies]  [Allergies] No Known Allergies       Medications Prior to Admission  Medication Sig Dispense Refill   aspirin  EC 81 MG tablet Take 81 mg by mouth at bedtime.        atorvastatin  (LIPITOR) 20 MG tablet Take 20 mg by mouth at bedtime.       diclofenac (VOLTAREN) 50 MG EC tablet Take 50 mg by mouth 2 (two) times daily.       Multiple Vitamin (MULTIVITAMIN PO) Take 1 tablet by mouth daily.       Saw Palmetto 450 MG CAPS Take 450 mg by mouth 2 (two) times daily.              Home: Home Living Family/patient expects to be discharged to:: Private residence Living Arrangements: Spouse/significant other Available Help at Discharge: Family, Available 24 hours/day Type of Home: House Home Access: Ramped entrance Home Layout: One level Bathroom Shower/Tub: Engineer, Manufacturing Systems: Handicapped height Bathroom Accessibility: Yes Home Equipment: Grab bars - tub/shower, Grab bars - toilet, Shower seat, Other (comment), Cane - single point Additional Comments: uses urinal at night  Functional History: Prior Function Prior Level of Function : Independent/Modified Independent, Driving Mobility Comments: normally ind; was using [employer] after back surgery in [place] but had gotten away from it. ADLs Comments: INd with ADLs/IADLs. Functional Status:  Mobility: Bed Mobility Overal bed mobility: Needs Assistance Bed Mobility: Supine to Sit Supine to sit: Min assist, Mod assist Sit to supine: Min assist General bed mobility comments: Assist to bring hips forward to EOB Transfers Overall transfer level: Needs assistance Equipment used: 1 person hand held assist Transfers: Sit  to/from Stand, Bed to chair/wheelchair/BSC Sit to Stand: Min assist, From elevated surface Bed to/from chair/wheelchair/BSC transfer type:: Step pivot Step pivot transfers: Min assist, Mod assist General transfer comment: Min A for sit to stand from elevated stretcher verbal cues for safety. Ambulation/Gait General Gait Details: Pt attempted to march in place but was unable to clear the RLE from the floor without LLE buckling.   ADL: ADL Overall ADL's : Needs assistance/impaired Eating/Feeding: Set up, Sitting Grooming: Wash/dry hands, Wash/dry face, Set up, Minimal assistance, Sitting Upper Body Bathing: Moderate assistance, Sitting Lower Body Bathing: Maximal assistance, Sit to/from stand, Sitting/lateral leans Upper Body Dressing : Moderate assistance, Sitting Lower Body Dressing: Moderate assistance, Maximal assistance, Sit to/from stand, Sitting/lateral leans Toilet Transfer: Moderate assistance, Stand-pivot, BSC/3in1   Cognition: Cognition Orientation Level: Oriented X4 Cognition Arousal: Alert Behavior During Therapy: WFL for tasks assessed/performed   Blood pressure ROLLEN)  161/56, pulse (!) 52, temperature 98.3 F (36.8 C), resp. rate 16, height 5' 9 (1.753 m), weight 86.2 kg, SpO2 98%. Physical Exam   Constitutional: No apparent distress. Appropriate appearance for age.  HENT: No JVD. Neck Supple. Trachea midline. Atraumatic, normocephalic. +glasses + HOH bilaterally - hearing aides currently not charged   Eyes: PERRLA. EOMI. Visual fields grossly intact.   Cardiovascular: RRR, no murmurs/rub/gallops. No Edema. Peripheral pulses 2+  Respiratory: CTAB. No rales, rhonchi, or wheezing. On RA.  Abdomen: + bowel sounds, normoactive. No distention or tenderness.  Skin: Dry, flaking skin BL shins . MSK:      No apparent deformity.       Neurologic exam:  Cognition: AAO to person, place, time and event.   + Mild to moderate cognitive delay + ?mild L hemineglect Language:  Fluent, No substitutions or neoglisms. Mild dysarthria. Names 3/3 objects correctly.   Memory: Recalls 2/3 objects at 5 minutes.   Insight: Fair insight into current condition.  Mood: Pleasant affect, appropriate mood.  Sensation: To light touch intact in BL UEs and LEs   Reflexes: 2+ in BL UE and LEs. Negative Hoffman's and babinski signs bilaterally.   CN: + L ptosis, L facial droop, L shoulder weakness,  Coordination: No apparent tremors. No ataxia on FTN, HTS on R.  Spasticity: MAS 0 in all extremities.        Strength:                RUE: 5/5 SA, 5/5 EF, 5/5 EE, 5/5 WE, 5/5 FF, 5/5 FA                LUE:  2/5 SA, 0/5 EF, 1/5 EE, 0/5 WE, 0/5 FF, tr/5 FA                RLE: 5/5 HF, 5/5 KE, 5/5  DF, 5/5  EHL, 5/5  PF                 LLE:  4-/5 HF, 4-/5 KE, 3/5  DF, 3/5  EHL, 4/5  PF      Lab Results Last 24 Hours       Results for orders placed or performed during the hospital encounter of 04/27/24 (from the past 24 hours)  Lipid panel     Status: None    Collection Time: 04/28/24  1:48 AM  Result Value Ref Range    Cholesterol 124 0 - 200 mg/dL    Triglycerides 49 <849 mg/dL    HDL 42 >59 mg/dL    Total CHOL/HDL Ratio 3.0 RATIO    VLDL 10 0 - 40 mg/dL    LDL Cholesterol 72 0 - 99 mg/dL  CBC with Differential/Platelet     Status: Abnormal    Collection Time: 04/28/24  1:48 AM  Result Value Ref Range    WBC 7.6 4.0 - 10.5 K/uL    RBC 4.02 (L) 4.22 - 5.81 MIL/uL    Hemoglobin 13.0 13.0 - 17.0 g/dL    HCT 61.3 (L) 60.9 - 52.0 %    MCV 96.0 80.0 - 100.0 fL    MCH 32.3 26.0 - 34.0 pg    MCHC 33.7 30.0 - 36.0 g/dL    RDW 86.1 88.4 - 84.4 %    Platelets 134 (L) 150 - 400 K/uL    nRBC 0.0 0.0 - 0.2 %    Neutrophils Relative % 59 %    Neutro Abs 4.5 1.7 - 7.7 K/uL  Lymphocytes Relative 27 %    Lymphs Abs 2.1 0.7 - 4.0 K/uL    Monocytes Relative 8 %    Monocytes Absolute 0.6 0.1 - 1.0 K/uL    Eosinophils Relative 5 %    Eosinophils Absolute 0.3 0.0 - 0.5 K/uL    Basophils  Relative 1 %    Basophils Absolute 0.1 0.0 - 0.1 K/uL    Immature Granulocytes 0 %    Abs Immature Granulocytes 0.02 0.00 - 0.07 K/uL  Comprehensive metabolic panel with GFR     Status: Abnormal    Collection Time: 04/28/24  1:48 AM  Result Value Ref Range    Sodium 141 135 - 145 mmol/L    Potassium 3.7 3.5 - 5.1 mmol/L    Chloride 108 98 - 111 mmol/L    CO2 25 22 - 32 mmol/L    Glucose, Bld 108 (H) 70 - 99 mg/dL    BUN 21 8 - 23 mg/dL    Creatinine, Ser 8.98 0.61 - 1.24 mg/dL    Calcium  8.8 (L) 8.9 - 10.3 mg/dL    Total Protein 5.4 (L) 6.5 - 8.1 g/dL    Albumin 3.3 (L) 3.5 - 5.0 g/dL    AST 20 15 - 41 U/L    ALT 24 0 - 44 U/L    Alkaline Phosphatase 67 38 - 126 U/L    Total Bilirubin 0.8 0.0 - 1.2 mg/dL    GFR, Estimated >39 >39 mL/min    Anion gap 8 5 - 15  Magnesium      Status: None    Collection Time: 04/28/24  1:48 AM  Result Value Ref Range    Magnesium  2.0 1.7 - 2.4 mg/dL  Phosphorus     Status: None    Collection Time: 04/28/24  1:48 AM  Result Value Ref Range    Phosphorus 3.3 2.5 - 4.6 mg/dL      Imaging Results (Last 48 hours)  ECHOCARDIOGRAM COMPLETE Result Date: 04/27/2024    ECHOCARDIOGRAM REPORT   Patient Name:   Robert Everett Date of Exam: 04/27/2024 Medical Rec #:  [id number]       Height:       69.0 in Accession #:    [id number]      Weight:       190.0 lb Date of Birth:  18-May-1933       BSA:          2.022 m Patient Age:    89 years        BP:           174/41 mmHg Patient Gender: M               HR:           51 bpm. Exam Location:  Inpatient Procedure: 2D Echo, Cardiac Doppler, Color Doppler and Saline Contrast Bubble            Study (Both Spectral and Color Flow Doppler were utilized during            procedure). Indications:   Stroke I63.9  History:       Patient has no prior history of Echocardiogram examinations.                Stroke.  Sonographer:   Tinnie Gosling RDCS Referring      [id number] [name] Phys: IMPRESSIONS  1. Left ventricular ejection  fraction, by estimation, is 60 to 65%. The left ventricle has normal  function. The left ventricle has no regional wall motion abnormalities. Left ventricular diastolic parameters are consistent with Grade I diastolic dysfunction (impaired relaxation).  2. Right ventricular systolic function is normal. The right ventricular size is normal.  3. Left atrial size was mildly dilated.  4. The mitral valve is normal in structure. Trivial mitral valve regurgitation. No evidence of mitral stenosis.  5. The aortic valve is tricuspid. There is mild calcification of the aortic valve. Aortic valve regurgitation is moderate to severe. Aortic valve sclerosis is present, with no evidence of aortic valve stenosis.  6. Aortic dilatation noted. There is borderline dilatation of the ascending aorta, measuring 38 mm.  7. Agitated saline contrast bubble study was positive with shunting observed within 3-6 cardiac cycles suggestive of interatrial shunt. Comparison(s): No prior Echocardiogram. Conclusion(s)/Recommendation(s): Very eccentric aortic regurgitation, likely moderate to severe. Could consider TEE or cardiac MRI for further evaluation. Bubble study positive for intraatrial shunt. FINDINGS  Left Ventricle: Left ventricular ejection fraction, by estimation, is 60 to 65%. The left ventricle has normal function. The left ventricle has no regional wall motion abnormalities. The left ventricular internal cavity size was normal in size. There is  borderline left ventricular hypertrophy. Left ventricular diastolic parameters are consistent with Grade I diastolic dysfunction (impaired relaxation). Right Ventricle: The right ventricular size is normal. Right vetricular wall thickness was not well visualized. Right ventricular systolic function is normal. Left Atrium: Left atrial size was mildly dilated. Right Atrium: Right atrial size was not well visualized. Pericardium: There is no evidence of pericardial effusion. Mitral Valve: The  mitral valve is normal in structure. Trivial mitral valve regurgitation. No evidence of mitral valve stenosis. Tricuspid Valve: The tricuspid valve is normal in structure. Tricuspid valve regurgitation is trivial. No evidence of tricuspid stenosis. Aortic Valve: While 5 chamber apical views show AR filling the LVOT, this is very eccentric, and on other views this appears less severe. The aortic valve is tricuspid. There is mild calcification of the aortic valve. Aortic valve regurgitation is moderate to severe. Aortic valve sclerosis is present, with no evidence of aortic valve stenosis. Pulmonic Valve: The pulmonic valve was not well visualized. Pulmonic valve regurgitation is trivial. No evidence of pulmonic stenosis. Aorta: Aortic dilatation noted. There is borderline dilatation of the ascending aorta, measuring 38 mm. Venous: The inferior vena cava was not well visualized. IAS/Shunts: The atrial septum is grossly normal. Agitated saline contrast was given intravenously to evaluate for intracardiac shunting. Agitated saline contrast bubble study was positive with shunting observed within 3-6 cardiac cycles suggestive of interatrial shunt.  LEFT VENTRICLE PLAX 2D LVIDd:         5.30 cm      Diastology LVIDs:         3.50 cm      LV e' lateral:   3.59 cm/s LV PW:         1.10 cm      LV E/e' lateral: 10.7 LV IVS:        1.10 cm LVOT diam:     2.40 cm LV SV:         171 LV SV Index:   84 LVOT Area:     4.52 cm  LV Volumes (MOD) LV vol d, MOD A2C: 185.0 ml LV vol d, MOD A4C: 187.0 ml LV vol s, MOD A2C: 71.8 ml LV vol s, MOD A4C: 71.8 ml LV SV MOD A2C:     113.2 ml LV SV MOD A4C:  187.0 ml LV SV MOD BP:      112.6 ml RIGHT VENTRICLE RV S prime:     14.70 cm/s TAPSE (M-mode): 1.6 cm LEFT ATRIUM             Index LA diam:        5.00 cm 2.47 cm/m LA Vol (A2C):   44.3 ml 21.91 ml/m LA Vol (A4C):   64.6 ml 31.95 ml/m LA Biplane Vol: 55.3 ml 27.35 ml/m  AORTIC VALVE LVOT Vmax:   138.00 cm/s LVOT Vmean:  96.100 cm/s  LVOT VTI:    0.377 m  AORTA Ao Root diam: 3.60 cm Ao Asc diam:  3.80 cm MITRAL VALVE MV Area (PHT): 3.60 cm    SHUNTS MV Decel Time: 211 msec    Systemic VTI:  0.38 m MV E velocity: 38.30 cm/s  Systemic Diam: 2.40 cm MV A velocity: 66.60 cm/s MV E/A ratio:  0.58 Shelda Bruckner MD Electronically signed by Shelda Bruckner MD Signature Date/Time: 04/27/2024/8:17:27 PM    Final     MR BRAIN WO CONTRAST Result Date: 04/27/2024 EXAM: MRI BRAIN WITHOUT CONTRAST 04/27/2024 12:09:00 PM TECHNIQUE: Multiplanar multisequence MRI of the head/brain was performed without the administration of intravenous contrast. COMPARISON: Same day CT head and CTA head and neck. CLINICAL HISTORY: Stroke, follow up. FINDINGS: BRAIN AND VENTRICLES: There is a 1.7 cm focus of restricted diffusion in the posterior aspect of the right corona radiata compatible with an acute infarct. Encephalomalacia and gliosis in the left superior frontal gyrus compatible with remote infarct. There are multiple additional remote infarcts in the cerebellum, right more than left. Additional remote cortical infarct in the right occipital lobe. T2 and FLAIR hyperintensity in the periventricular and subcortical white matter suggestive of mild to moderate chronic microvascular ischemic changes. Mild parietal predominant age related volume loss. Chronic microhemorrhage in the left temporal lobe. No acute intracranial hemorrhage. No mass. No midline shift. No hydrocephalus. The sella is unremarkable. Normal flow voids. ORBITS: Bilateral lens replacement. SINUSES AND MASTOIDS: No significant abnormality. BONES AND SOFT TISSUES: Normal marrow signal. No significant soft tissue abnormality. IMPRESSION: 1. Acute infarct in the posterior aspect of the right corona radiata. 2. Multiple remote infarcts including the left superior frontal gyrus, right occipital lobe, and cerebellum. 3. Mild to moderate chronic microvascular ischemic changes. Electronically signed by:  Donnice Mania MD MD 04/27/2024 02:13 PM EST RP Workstation: [id number]    CT ANGIO HEAD NECK W WO CM W PERF (CODE STROKE) Addendum Date: 04/27/2024 ADDENDUM REPORT: 04/27/2024 10:40 ADDENDUM: A perfusion study was also obtained. The study is normal. No perfusion deficit identified. Electronically Signed   By: Nancyann Burns M.D.   On: 04/27/2024 10:40    Result Date: 04/27/2024 CLINICAL DATA:  Left arm weakness EXAM: CT ANGIOGRAPHY HEAD AND NECK WITH CONTRAST TECHNIQUE: Multidetector CT imaging of the head and neck was performed using the standard protocol during bolus administration of intravenous contrast. Multiplanar CT image reconstructions and MIPs were obtained to evaluate the vascular anatomy. Carotid stenosis measurements (when applicable) are obtained utilizing NASCET criteria, using the distal internal carotid diameter as the denominator. RADIATION DOSE REDUCTION: This exam was performed according to the departmental dose-optimization program which includes automated exposure control, adjustment of the mA and/or kV according to patient size and/or use of iterative reconstruction technique. CONTRAST:  OMNIPAQUE  IOHEXOL  350 MG/ML SOLN COMPARISON:  None Available. CTA NECK: CTA NECK Aortic arch: No proximal vessel stenosis. Right carotid: Normal Left carotid: Normal Right vertebral:  Normal Left vertebral: Normal Soft tissues: No significant abnormality Other comments: None CTA HEAD: CTA HEAD Right anterior circulation: The internal carotid artery is patent without significant stenosis. The anterior and middle cerebral arteries are patent without significant stenosis or proximal branch occlusion. No aneurysm. Left anterior circulation: The internal carotid artery is patent without significant stenosis. The anterior and middle cerebral arteries are patent without significant stenosis or proximal branch occlusion. No aneurysm. Posterior circulation: Both vertebral arteries are patent. There is no  significant basilar stenosis. Both posterior cerebral arteries are patent without significant stenosis or proximal branch occlusion. No aneurysm. IMPRESSION: No carotid stenosis on either side No significant vertebrobasilar or intracranial disease Electronically Signed: By: Nancyann Burns M.D. On: 04/27/2024 09:57    CT HEAD CODE STROKE WO CONTRAST Result Date: 04/27/2024 EXAM: CT HEAD WITHOUT CONTRAST 04/27/2024 09:37:00 AM TECHNIQUE: CT of the head was performed without the administration of intravenous contrast. Automated exposure control, iterative reconstruction, and/or weight based adjustment of the mA/kV was utilized to reduce the radiation dose to as low as reasonably achievable. COMPARISON: Brain MRI 01/06/2006, Head CT 10/20/2005. CLINICAL HISTORY: 89 year old male with acute neurological deficit, stroke suspected. FINDINGS: BRAIN AND VENTRICLES: No acute hemorrhage. No evidence of acute infarct. No hydrocephalus. No extra-axial collection. No mass effect or midline shift. Chronic encephalomalacia anterior left superior frontal gyrus, new since 2007. Patchy and confluent but more age indeterminate hypodensity in the inferior right parietal lobe (coronal image 65). Small but circumscribed chronic appearing cerebellar infarcts. Mild for age patchy cerebral white matter hypodensity otherwise. No suspicious intracranial vascular hyperdensity. Calcified atherosclerosis at the skull base. ORBITS: Chronic left orbital floor fracture. SINUSES: Paranasal sinuses, tympanic cavities, and mastoids are well aerated. SOFT TISSUES AND SKULL: No acute soft tissue abnormality. No skull fracture. ALBERTA STROKE PROGRAM EARLY CT SCORE (ASPECTS): Ganglionic (caudate, IC, lentiform nucleus, insula, M1-M3): 7. Supraganglionic (M4-M6): 3. Total: 10. IMPRESSION: 1. Age indeterminate right PCA territory infarct. Otherwise, chronic findings (including left MCA and bilateral cerebellar artery territories). ASPECTS 10. 2. These  results were communicated to Dr. JUDITHANN Raddle at [phone] hours on 04/27/2024 by text page via the [facility] messaging system. Electronically signed by: Helayne Hurst MD MD 04/27/2024 09:45 AM EST RP Workstation: [id number]        Assessment/Plan: Diagnosis: Acute right corona radiata CVA, etiology pending workup Does the need for close, 24 hr/day medical supervision in concert with the patient's rehab needs make it unreasonable for this patient to be served in a less intensive setting?  Co-Morbidities requiring supervision/potential complications: Left-sided hemiparesis, cardiac workup for stroke etiology, hypertension, cognitive deficits. due to safety, disease management, medication administration, and patient education, does the patient require 24 hr/day rehab nursing? Yes Does the patient require coordinated care of a physician, rehab nurse, therapy disciplines of PT, OT, and SLP to address physical and functional deficits in the context of the above medical diagnosis(es)? Yes Addressing deficits in the following areas: balance, endurance, locomotion, strength, transferring, bathing, dressing, feeding, grooming, cognition, dysarthria and toileting Can the patient actively participate in an intensive therapy program of at least 3 hrs of therapy per day at least 5 days per week? Yes The potential for patient to make measurable gains while on inpatient rehab is good Anticipated functional outcomes upon discharge from inpatient rehab are supervision  with PT, supervision with OT, supervision SLP Estimated rehab length of stay to reach the above functional goals is: 10-14 days Anticipated discharge destination: Home Overall Rehab/Functional Prognosis: good   POST  ACUTE RECOMMENDATIONS: This patient's condition is appropriate for continued rehabilitative care in the following setting: CIR Patient has agreed to participate in recommended program. Yes Note that insurance prior authorization may be required for  reimbursement for recommended care.     I have personally performed a face to face diagnostic evaluation of this patient. Additionally, I have examined the patient's medical record including any pertinent labs and radiographic images. If the physician assistant has documented in this note, I have reviewed and edited or otherwise concur with the physician assistant's documentation.   Thanks,   Joesph JAYSON Likes, DO 04/28/2024  "

## 2024-05-02 NOTE — Interval H&P Note (Signed)
 History and Physical Interval Note:  05/02/2024 2:29 PM  Robert Everett  has presented today for surgery, with the diagnosis of MV.  The various methods of treatment have been discussed with the patient and family. After consideration of risks, benefits and other options for treatment, the patient has consented to  Procedures: TRANSESOPHAGEAL ECHOCARDIOGRAM (N/A) as a surgical intervention.  The patient's history has been reviewed, patient examined, no change in status, stable for surgery.  I have reviewed the patient's chart and labs.  Questions were answered to the patient's satisfaction.     Salena GORMAN Negri

## 2024-05-02 NOTE — Progress Notes (Signed)
 Inpatient Rehab Admissions Coordinator:    I have a CIR bed for this pt. Will admit after TEE today.   Pt. To admit to CIR for an estimated 7-10 days with the goal of dc home with his wife at supervision level.   Leita Kleine, MS, CCC-SLP Rehab Admissions Coordinator  [phone] (celll) [phone] (office)

## 2024-05-02 NOTE — Progress Notes (Signed)
" °  Echocardiogram Echocardiogram Transesophageal has been performed.  Koleen KANDICE Popper, RDCS 05/02/2024, 4:13 PM "

## 2024-05-02 NOTE — Anesthesia Procedure Notes (Signed)
 Procedure Name: MAC Date/Time: 05/02/2024 2:47 PM  Performed by: Vertie Arthea RAMAN, CRNAPre-anesthesia Checklist: Patient identified, Emergency Drugs available, Suction available, Patient being monitored and Timeout performed Patient Re-evaluated:Patient Re-evaluated prior to induction Oxygen Delivery Method: Simple face mask

## 2024-05-03 DIAGNOSIS — I639 Cerebral infarction, unspecified: Secondary | ICD-10-CM | POA: Diagnosis not present

## 2024-05-03 LAB — CBC WITH DIFFERENTIAL/PLATELET
Abs Immature Granulocytes: 0.03 K/uL (ref 0.00–0.07)
Basophils Absolute: 0 K/uL (ref 0.0–0.1)
Basophils Relative: 1 %
Eosinophils Absolute: 0.3 K/uL (ref 0.0–0.5)
Eosinophils Relative: 3 %
HCT: 39.3 % (ref 39.0–52.0)
Hemoglobin: 13.1 g/dL (ref 13.0–17.0)
Immature Granulocytes: 0 %
Lymphocytes Relative: 20 %
Lymphs Abs: 1.5 K/uL (ref 0.7–4.0)
MCH: 32.4 pg (ref 26.0–34.0)
MCHC: 33.3 g/dL (ref 30.0–36.0)
MCV: 97.3 fL (ref 80.0–100.0)
Monocytes Absolute: 0.8 K/uL (ref 0.1–1.0)
Monocytes Relative: 11 %
Neutro Abs: 5.1 K/uL (ref 1.7–7.7)
Neutrophils Relative %: 65 %
Platelets: 152 K/uL (ref 150–400)
RBC: 4.04 MIL/uL — ABNORMAL LOW (ref 4.22–5.81)
RDW: 13.8 % (ref 11.5–15.5)
WBC: 7.8 K/uL (ref 4.0–10.5)
nRBC: 0 % (ref 0.0–0.2)

## 2024-05-03 LAB — COMPREHENSIVE METABOLIC PANEL WITH GFR
ALT: 64 U/L — ABNORMAL HIGH (ref 0–44)
AST: 36 U/L (ref 15–41)
Albumin: 3.5 g/dL (ref 3.5–5.0)
Alkaline Phosphatase: 77 U/L (ref 38–126)
Anion gap: 8 (ref 5–15)
BUN: 21 mg/dL (ref 8–23)
CO2: 26 mmol/L (ref 22–32)
Calcium: 9.2 mg/dL (ref 8.9–10.3)
Chloride: 104 mmol/L (ref 98–111)
Creatinine, Ser: 0.9 mg/dL (ref 0.61–1.24)
GFR, Estimated: >60 mL/min
Glucose, Bld: 113 mg/dL — ABNORMAL HIGH (ref 70–99)
Potassium: 4.5 mmol/L (ref 3.5–5.1)
Sodium: 138 mmol/L (ref 135–145)
Total Bilirubin: 1 mg/dL (ref 0.0–1.2)
Total Protein: 5.9 g/dL — ABNORMAL LOW (ref 6.5–8.1)

## 2024-05-03 MED ORDER — MAGNESIUM HYDROXIDE 400 MG/5ML PO SUSP
30.0000 mL | Freq: Once | ORAL | Status: AC
  Administered 2024-05-03: 30 mL via ORAL
  Filled 2024-05-03: qty 30

## 2024-05-03 MED ORDER — MENTHOL 3 MG MT LOZG
1.0000 | LOZENGE | OROMUCOSAL | Status: DC | PRN
  Filled 2024-05-03: qty 9

## 2024-05-03 NOTE — Plan of Care (Signed)
" °  Problem: RH Balance Goal: LTG Patient will maintain dynamic sitting balance (PT) Description: LTG:  Patient will maintain dynamic sitting balance with assistance during mobility activities (PT) Flowsheets (Taken 05/03/2024 1412) LTG: Pt will maintain dynamic sitting balance during mobility activities with:: Supervision/Verbal cueing Goal: LTG Patient will maintain dynamic standing balance (PT) Description: LTG:  Patient will maintain dynamic standing balance with assistance during mobility activities (PT) Flowsheets (Taken 05/03/2024 1412) LTG: Pt will maintain dynamic standing balance during mobility activities with:: Contact Guard/Touching assist   Problem: Sit to Stand Goal: LTG:  Patient will perform sit to stand with assistance level (PT) Description: LTG:  Patient will perform sit to stand with assistance level (PT) Flowsheets (Taken 05/03/2024 1412) LTG: PT will perform sit to stand in preparation for functional mobility with assistance level: Supervision/Verbal cueing   Problem: RH Bed Mobility Goal: LTG Patient will perform bed mobility with assist (PT) Description: LTG: Patient will perform bed mobility with assistance, with/without cues (PT). Flowsheets (Taken 05/03/2024 1412) LTG: Pt will perform bed mobility with assistance level of: Supervision/Verbal cueing   Problem: RH Bed to Chair Transfers Goal: LTG Patient will perform bed/chair transfers w/assist (PT) Description: LTG: Patient will perform bed to chair transfers with assistance (PT). Flowsheets (Taken 05/03/2024 1412) LTG: Pt will perform Bed to Chair Transfers with assistance level: Contact Guard/Touching assist   Problem: RH Car Transfers Goal: LTG Patient will perform car transfers with assist (PT) Description: LTG: Patient will perform car transfers with assistance (PT). Flowsheets (Taken 05/03/2024 1412) LTG: Pt will perform car transfers with assist:: Minimal Assistance - Patient > 75%   Problem: RH  Ambulation Goal: LTG Patient will ambulate in controlled environment (PT) Description: LTG: Patient will ambulate in a controlled environment, # of feet with assistance (PT). Flowsheets (Taken 05/03/2024 1412) LTG: Pt will ambulate in controlled environ  assist needed:: Contact Guard/Touching assist LTG: Ambulation distance in controlled environment: 150' Goal: LTG Patient will ambulate in home environment (PT) Description: LTG: Patient will ambulate in home environment, # of feet with assistance (PT). Flowsheets (Taken 05/03/2024 1412) LTG: Pt will ambulate in home environ  assist needed:: Contact Guard/Touching assist LTG: Ambulation distance in home environment: 50'   Problem: RH Stairs Goal: LTG Patient will ambulate up and down stairs w/assist (PT) Description: LTG: Patient will ambulate up and down # of stairs with assistance (PT) Flowsheets (Taken 05/03/2024 1412) LTG: Pt will ambulate up/down stairs assist needed:: Minimal Assistance - Patient > 75% LTG: Pt will  ambulate up and down number of stairs: at least 4 steps with LRAD   "

## 2024-05-03 NOTE — Evaluation (Signed)
 Speech Language Pathology Assessment and Plan  Patient Details  Name: Robert Everett MRN: [id number] Date of Birth: January 26, 1934  SLP Diagnosis: Cognitive Impairments  Rehab Potential: Excellent ELOS: 10-12 days    Today's Date: 05/03/2024 SLP Individual Time: 9199-9142 SLP Individual Time Calculation (min): 57 min   Hospital Problem: Principal Problem:   Small vessel stroke [facility])  Past Medical History:  Past Medical History:  Diagnosis Date   Arthritis    hands & shoulder    Cancer (HCC)    basal cell- nose   Diverticulosis    DVT (deep venous thrombosis) (HCC) 04/20/2005   treated with coumadin- x1    H/O exercise stress test 2000?   done following what they thought was an TIA, told all was wnll   Prostate enlargement    takes saw palmetto    Pulmonary embolism (HCC) 04/20/2005   with DVT   Recurrent acute deep vein thrombosis (DVT) of left lower extremity (HCC) 07/05/2014   treated with Xarelto    Seasonal allergies    uses Flonase nasal spray   Stroke [facility])    TIA- 1990's   Past Surgical History:  Past Surgical History:  Procedure Laterality Date   INSERTION OF MESH Bilateral 06/28/2014   Procedure: INSERTION OF MESH;  Surgeon: Vicenta Poli, MD;  Location: [facility] OR;  Service: General;  Laterality: Bilateral;   LAPAROSCOPIC INGUINAL HERNIA WITH UMBILICAL HERNIA Bilateral 06/28/2014   Procedure: LAPAROSCOPIC RIGHT AND LEFT  INGUINAL HERNIA WITH UMBILICAL HERNIA  AND MESH;  Surgeon: Vicenta Poli, MD;  Location: [facility] OR;  Service: General;  Laterality: Bilateral;   polyps removed   2010   on colonoscopy   SHOULDER SURGERY Right    rotator cuff repair , x2 on the same shoulder   TONSILLECTOMY     TRANSESOPHAGEAL ECHOCARDIOGRAM (CATH LAB) N/A 05/02/2024   Procedure: TRANSESOPHAGEAL ECHOCARDIOGRAM;  Surgeon: Claudene Pacific, MD;  Location: [facility] INVASIVE CV LAB;  Service: Cardiovascular;  Laterality: N/A;    Assessment / Plan / Recommendation Clinical Impression HPI:   Robert Everett is a 89 y.o. male with past medical history  of TIA, DVT, right shoulder surgeries, low back pain hyperlipidemia who was brought to the [facility] emergency room 04/27/24 with left-sided weakness wife at bedside states that patient was normal yesterday and this morning when she was trying to get him out of bed noted that he could not move his left upper extremity.  ded weakness noted when his wife attempted to get him up in the morning.  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  CTA head and neck without significant stenosis or disease.  MRI brain with acute infarct in the posterior right corona radiata and multiple remote infarcts in the left frontal gyrus, right occipital lobe, and cerebellum along with mild to moderate chronic microvascular changes.  He was treated with aspirin  and Plavix , not a candidate for thrombolysis or thrombectomy due to out of window for intervention.  He was admitted to the telemetry service for continuous cardiac monitoring.  Hospitalization has been otherwise complicated by hypertension, intermittent bradycardia. PM&R was consulted to evaluate appropriateness for IPR admission   Clinical Impression:  Cognitive-Linguistic:  Patient was evaluated via informal measures along with the Cognistat to assess cognitive linguistic functioning. Patient reports independently managing money and finances PTA. Patient aware of physical changes, though reports no cognitive deficits. Per standardized assessment, patient with functional orientation, delayed recall, expressive/receptive  language. Patient with mild-moderate deficits in immediate recall (of note, patient is [facility] w/ no hearing aides present) and problem solving (calculations, judgement). Patient increasingly aware of cognitive changes as session progressed. Patient would benefit from skilled ST targeting intellectual awareess  and mildly complex problem solving.  Pt would benefit from skilled ST services to maximize cognition in order to maximize functional independence at d/c. Anticipate patient will require supervision at d/c and TBD f/u SLP services.    Skilled Therapeutic Interventions          Patient evaluated using a standardized cognitive linguistic assessment to assess current cognitive, and communicative functioning. See above for details.    SLP Assessment  Patient will need skilled Speech Lanaguage Pathology Services during CIR admission    Recommendations  SLP Diet Recommendations: Age appropriate regular solids;Thin Liquid Administration via: Cup;Straw Medication Administration: Whole meds with liquid Supervision: Patient able to self feed Compensations: Slow rate;Small sips/bites Postural Changes and/or Swallow Maneuvers: Seated upright 90 degrees Oral Care Recommendations: Oral care BID Patient destination: Home Follow up Recommendations:  (TBD) Equipment Recommended: None recommended by SLP    SLP Frequency 1 to 3 out of 7 days   SLP Duration  SLP Intensity  SLP Treatment/Interventions 10-12 days  Minumum of 1-2 x/day, 30 to 90 minutes  Cognitive remediation/compensation;Cueing hierarchy;Internal/external aids;Patient/family education;Therapeutic Activities    Pain Denies   SLP Evaluation Cognition Overall Cognitive Status: Impaired/Different from baseline Arousal/Alertness: Awake/alert Orientation Level: Oriented X4 Year: 2026 Month: January Day of Week: Correct Attention: Sustained Sustained Attention: Appears intact Memory: Appears intact Awareness: Impaired Awareness Impairment: Intellectual impairment;Emergent impairment Problem Solving: Impaired Problem Solving Impairment: Verbal complex;Functional complex  Comprehension Auditory Comprehension Overall Auditory Comprehension: Appears within functional limits for tasks assessed Yes/No Questions: Within Functional  Limits Commands: Within Functional Limits Interfering Components: Hearing Expression Expression Primary Mode of Expression: Verbal Verbal Expression Overall Verbal Expression: Appears within functional limits for tasks assessed Repetition: No impairment Naming: No impairment Oral Motor Oral Motor/Sensory Function Overall Oral Motor/Sensory Function: Mild impairment Facial ROM: Reduced left Facial Symmetry: Abnormal symmetry left Facial Strength: Within Functional Limits Facial Sensation: Within Functional Limits Lingual ROM: Within Functional Limits Lingual Symmetry: Within Functional Limits Lingual Sensation: Within Functional Limits Velum: Within Functional Limits Mandible: Within Functional Limits Motor Speech Overall Motor Speech: Appears within functional limits for tasks assessed Respiration: Within functional limits Phonation: Normal Resonance: Within functional limits Articulation: Within functional limitis Intelligibility: Intelligible Motor Planning: Within functional limits  Care Tool Care Tool Cognition Ability to hear (with hearing aid or hearing appliances if normally used Ability to hear (with hearing aid or hearing appliances if normally used): 2. Moderate difficulty - speaker has to increase volume and speak distinctly   Expression of Ideas and Wants Expression of Ideas and Wants: 4. Without difficulty (complex and basic) - expresses complex messages without difficulty and with speech that is clear and easy to understand   Understanding Verbal and Non-Verbal Content Understanding Verbal and Non-Verbal Content: 4. Understands (complex and basic) - clear comprehension without cues or repetitions  Memory/Recall Ability Memory/Recall Ability : Current season;That he or she is in a hospital/hospital unit   Short Term Goals: Week 1: SLP Short Term Goal 1 (Week 1): Patient will recall cognitive and physical changes with min verbal A SLP Short Term Goal 2 (Week 1):  Patient will demonstrate problem solving skills in functional situations given min mulitmodal A  Refer to Care Plan for Long Term Goals  Recommendations for other services: None  Discharge Criteria: Patient will be discharged from SLP if patient refuses treatment 3 consecutive times without medical reason, if treatment goals not met, if there is a change in medical status, if patient makes no progress towards goals or if patient is discharged from hospital.  The above assessment, treatment plan, treatment alternatives and goals were discussed and mutually agreed upon: by patient  Brayden Betters M.A., CCC-SLP 05/03/2024, 8:51 AM

## 2024-05-03 NOTE — Progress Notes (Signed)
 Occupational Therapy Note  Patient Details  Name: ALF DOYLE MRN: [id number] Date of Birth: [birth year]   Occupational Therapist participated in the interdisciplinary team conference, providing clinical information regarding the patient's current status, treatment goals, and weekly focus, including any barriers that need to be addressed. Please see the Inpatient Rehabilitation Team Conference and Plan of Care Update for further details.    Dulcy Sida M 05/03/2024, 11:28 AM

## 2024-05-03 NOTE — Progress Notes (Deleted)
 "                                                        PROGRESS NOTE   Subjective/Complaints: No apparent issues overnight   Objective:   EP STUDY Result Date: 05/02/2024 See surgical note for result.  Recent Labs    05/03/24 0429  WBC 7.8  HGB 13.1  HCT 39.3  PLT 152   Recent Labs    05/03/24 0429  NA 138  K 4.5  CL 104  CO2 26  GLUCOSE 113*  BUN 21  CREATININE 0.90  CALCIUM  9.2    Intake/Output Summary (Last 24 hours) at 05/03/2024 0839 Last data filed at 05/03/2024 0744 Gross per 24 hour  Intake 236 ml  Output 1175 ml  Net -939 ml        Physical Exam: Vital Signs Blood pressure (!) 129/43, pulse 65, temperature 98.4 F (36.9 C), temperature source Oral, resp. rate 20, height 5' 10 (1.778 m), weight 68 kg, SpO2 96%.  General: Alert and oriented x 3, No apparent distress HEENT: Head is normocephalic, atraumatic, PERRLA, EOMI, sclera anicteric, oral mucosa pink and moist, dentition intact, ext ear canals clear,  Neck: Supple without JVD or lymphadenopathy Heart: Reg rate and rhythm. No murmurs rubs or gallops Chest: CTA bilaterally without wheezes, rales, or rhonchi; no distress Abdomen: Soft, non-tender, non-distended, bowel sounds positive. Extremities: No clubbing, cyanosis, or edema. Pulses are 2+ Psych: Pt's affect is appropriate. Pt is cooperative Skin: Clean and intact without signs of breakdown Neuro:  left central VII dysarthria, left hemiparesis Musculoskeletal:  no jt pain or swelling   Assessment/Plan: 1. Functional deficits which require 3+ hours per day of interdisciplinary therapy in a comprehensive inpatient rehab setting. Physiatrist is providing close team supervision and 24 hour management of active medical problems listed below. Physiatrist and rehab team continue to assess barriers to discharge/monitor patient progress toward functional and medical goals  Care Tool:  Bathing              Bathing assist       Upper  Body Dressing/Undressing Upper body dressing        Upper body assist      Lower Body Dressing/Undressing Lower body dressing            Lower body assist       Toileting Toileting    Toileting assist       Transfers Chair/bed transfer  Transfers assist           Locomotion Ambulation   Ambulation assist              Walk 10 feet activity   Assist           Walk 50 feet activity   Assist           Walk 150 feet activity   Assist           Walk 10 feet on uneven surface  activity   Assist           Wheelchair     Assist               Wheelchair 50 feet with 2 turns activity    Assist            Wheelchair 150  feet activity     Assist          Blood pressure (!) 129/43, pulse 65, temperature 98.4 F (36.9 C), temperature source Oral, resp. rate 20, height 5' 10 (1.778 m), weight 68 kg, SpO2 96%.  Medical Problem List and Plan: 1. Functional deficits secondary to right corona radiata infarct             -patient may  shower             -ELOS/Goals: 10-12 days, supervision goals with PT, OT, SLP  -begin therapies today, conference today 2.  Chronic LLE DVT/ Antithrombotics: -DVT/anticoagulation:  Pharmaceutical: Lovenox              -antiplatelet therapy: DAPT X 3 weeks followed by Plavix  alone.  3. Chronic LBP/ Pain Management: Tylenol  prn.  4. Mood/Behavior/Sleep: LCSW to follow for evaluation and support             --Melatonin prn for insomnia.              -antipsychotic agents: N/A 5. Neuropsych/cognition: This patient is capable of making decisions on his own behalf. 6. Skin/Wound Care: Routine pressure relief measures.  7. Fluids/Electrolytes/Nutrition: Monitor I/O. Check CMET in am  8. HTN: Permissive HTN for another 3 days to avoid hypoperfusion  -BP actually normal this am 9.  Thrombocytopenia: slowly recovering, up to 152k today 10. Moderate to severe aortic regurgitation:  BC X 2 negative so far. For TEE today.  11. Dyslipidemia: Now on increased dose of Liptor @ 40 mg.  12. Mild transaminitis: may be related to #11  -recheck LFT's Friday. If elevated further will need to consider med change or dose adjustment       LOS: 1 days A FACE TO FACE EVALUATION WAS PERFORMED  Arthea ONEIDA Gunther 05/03/2024, 8:39 AM     "

## 2024-05-03 NOTE — Progress Notes (Signed)
 Inpatient Rehabilitation  Patient information reviewed and entered into eRehab system by [facility]. Karen Kays., CCC/SLP, PPS Coordinator.  Information including medical coding, functional ability and quality indicators will be reviewed and updated through discharge.

## 2024-05-03 NOTE — Progress Notes (Signed)
 Inpatient Rehabilitation Center Individual Statement of Services  Patient Name:  Robert Everett  Date:  05/03/2024  Welcome to the Inpatient Rehabilitation Center.  Our goal is to provide you with an individualized program based on your diagnosis and situation, designed to meet your specific needs.  With this comprehensive rehabilitation program, you will be expected to participate in at least 3 hours of rehabilitation therapies Monday-Friday, with modified therapy programming on the weekends.  Your rehabilitation program will include the following services:  Physical Therapy (PT), Occupational Therapy (OT), Speech Therapy (ST), 24 hour per day rehabilitation nursing, Therapeutic Recreaction (TR), Care Coordinator, Rehabilitation Medicine, Nutrition Services, and Pharmacy Services  Weekly team conferences will be held on Wednesday to discuss your progress.  Your Inpatient Rehabilitation Care Coordinator will talk with you frequently to get your input and to update you on team discussions.  Team conferences with you and your family in attendance may also be held.  Expected length of stay: 10-12 days   Overall anticipated outcome: Supervision   Depending on your progress and recovery, your program may change. Your Inpatient Rehabilitation Care Coordinator will coordinate services and will keep you informed of any changes. Your Inpatient Rehabilitation Care Coordinator's name and contact numbers are listed  below.  The following services may also be recommended but are not provided by the Inpatient Rehabilitation Center:  Driving Evaluations Home Health Rehabiltiation Services Outpatient Rehabilitation Services Vocational Rehabilitation   Arrangements will be made to provide these services after discharge if needed.  Arrangements include referral to agencies that provide these services.  Your insurance has been verified to be: [employer] MEDICARE / [facility] MEDICARE  Your primary doctor is:  Merilee, L.Addie, MD   Pertinent information will be shared with your doctor and your insurance company.  Inpatient Rehabilitation Care Coordinator:  Di'Asia Loreli SIERRAS [phone] or ELIGAH BRINKS  Information discussed with and copy given to patient by: Waverly Loreli, 05/03/2024, 9:29 AM

## 2024-05-03 NOTE — Evaluation (Addendum)
 Occupational Therapy Assessment and Plan  Patient Details  Name: Robert Everett MRN: [id number] Date of Birth: [birth year]  OT Diagnosis: abnormal posture, cognitive deficits, hemiplegia affecting non-dominant side, muscle weakness (generalized), and swelling of limb Rehab Potential: Rehab Potential (ACUTE ONLY): Good ELOS: 10-12 days   Today's Date: 05/03/2024 OT Individual Time: 9052-8899 OT Individual Time Calculation (min): 73 min     Hospital Problem: Principal Problem:   Small vessel stroke (HCC)   Past Medical History:  Past Medical History:  Diagnosis Date   Arthritis    hands & shoulder    Cancer (HCC)    basal cell- nose   Diverticulosis    DVT (deep venous thrombosis) (HCC) 04/20/2005   treated with coumadin- x1    H/O exercise stress test 2000?   done following what they thought was an TIA, told all was wnll   Prostate enlargement    takes saw palmetto    Pulmonary embolism (HCC) 04/20/2005   with DVT   Recurrent acute deep vein thrombosis (DVT) of left lower extremity (HCC) 07/05/2014   treated with Xarelto    Seasonal allergies    uses Flonase nasal spray   Stroke [facility])    TIA- 1990's   Past Surgical History:  Past Surgical History:  Procedure Laterality Date   INSERTION OF MESH Bilateral 06/28/2014   Procedure: INSERTION OF MESH;  Surgeon: Vicenta Poli, MD;  Location: [facility] OR;  Service: General;  Laterality: Bilateral;   LAPAROSCOPIC INGUINAL HERNIA WITH UMBILICAL HERNIA Bilateral 06/28/2014   Procedure: LAPAROSCOPIC RIGHT AND LEFT  INGUINAL HERNIA WITH UMBILICAL HERNIA  AND MESH;  Surgeon: Vicenta Poli, MD;  Location: [facility] OR;  Service: General;  Laterality: Bilateral;   polyps removed   2010   on colonoscopy   SHOULDER SURGERY Right    rotator cuff repair , x2 on the same shoulder   TONSILLECTOMY     TRANSESOPHAGEAL ECHOCARDIOGRAM (CATH LAB) N/A 05/02/2024   Procedure: TRANSESOPHAGEAL ECHOCARDIOGRAM;  Surgeon: Claudene Pacific, MD;  Location: [facility]  INVASIVE CV LAB;  Service: Cardiovascular;  Laterality: N/A;    Assessment & Plan Clinical Impression: Robert Everett is an 89 year old RH-male with history of TIA, PE/DVT, fall w/Left orbital Fx with anisocoria '07, right shoulder surgery,  L3 compression Fx s/p kyphoplasty 10/25, who was admitted on 04/27/24 with left sided weakness and left facial droop which was noted on getting up in the morning. UDS negative. CTA was negative for LVO and CTP without mismatch. MRI brain done revealing acute infarct in right corona radiata and multiple remote infarcts including left superior frontal gyrus, right occipital lobe and cerebellum. Dr. Rosemarie felt that stroke was due to small vessel disease and recommended DAPT X 3 weeks followed by Plavix  alone.    BLE dopplers showed chronic DVT in left popliteal vein. 2D echo showed EF 60-65% with moderate to severe AR and was positive for IA shunt.  Dr. Claudene consulted and recommended [facility] X 2 to rule out endocarditis as well as TEE for work up which is to be done today. PT/OT consulted and has been working with patient. He required min assist for transfers and mobility with decrease in left foot clearance and slight posterior lean as well as mod to max assist for ADLs. He was independent PTA and CIR recommended due to functional declinePatient transferred to CIR on 05/02/2024 .    Patient currently requires mod with basic self-care skills secondary to abnormal tone and unbalanced muscle activation and decreased sitting  balance, decreased standing balance, decreased postural control, hemiplegia, and decreased balance strategies.  Prior to hospitalization, patient could complete BADL/IADL with modified independent .  Patient will benefit from skilled intervention to decrease level of assist with basic self-care skills and increase independence with basic self-care skills prior to discharge home with care partner.  Anticipate patient will require minimal physical  assistance and follow up home health and follow up outpatient.  OT - End of Session Activity Tolerance: Decreased this session Endurance Deficit: Yes Endurance Deficit Description: quick to fatigue, needs extended seated rest breaks b/w mobility tasks OT Assessment Rehab Potential (ACUTE ONLY): Good OT Patient demonstrates impairments in the following area(s): Balance;Cognition;Safety;Endurance;Motor OT Basic ADL's Functional Problem(s): Eating;Grooming;Bathing;Dressing;Toileting OT Advanced ADL's Functional Problem(s): None OT Transfers Functional Problem(s): Toilet;Tub/Shower OT Additional Impairment(s): Fuctional Use of Upper Extremity OT Plan OT Intensity: Minimum of 1-2 x/day, 45 to 90 minutes OT Frequency: 5 out of 7 days OT Duration/Estimated Length of Stay: 10-12 days OT Treatment/Interventions: Balance/vestibular training;Disease mangement/prevention;Functional electrical stimulation;Neuromuscular re-education;Patient/family education;Self Care/advanced ADL retraining;Splinting/orthotics;Therapeutic Exercise;UE/LE Coordination activities;UE/LE Strength taining/ROM;Therapeutic Activities;Functional mobility training;Discharge planning;DME/adaptive equipment instruction;Cognitive remediation/compensation OT Self Feeding Anticipated Outcome(s): Mod I OT Basic Self-Care Anticipated Outcome(s): Min assist OT Toileting Anticipated Outcome(s): Min assist OT Bathroom Transfers Anticipated Outcome(s): Supervision OT Recommendation Recommendations for Other Services: Neuropsych consult Patient destination: Home Follow Up Recommendations: Home health OT;Outpatient OT Equipment Recommended: To be determined   OT Evaluation Precautions/Restrictions  Precautions Precautions: Fall Precaution/Restrictions Comments: hard of hearing Restrictions Weight Bearing Restrictions Per Provider Order: No Pain  Denies pain Home Living/Prior Functioning Home Living Family/patient expects to be  discharged to:: Private residence Living Arrangements: Spouse/significant other Available Help at Discharge: Family, Available 24 hours/day Type of Home: House Home Access: Ramped entrance Home Layout: One level Bathroom Shower/Tub: Engineer, Manufacturing Systems: Handicapped height Bathroom Accessibility: Yes Additional Comments: uses urinal at night  Lives With: Spouse IADL History Current License: Yes Mode of Transportation: Set Designer Occupation: Retired Prior Function Level of Independence: Independent with basic ADLs, Independent with homemaking with ambulation, Independent with gait, Requires assistive device for independence  Able to Take Stairs?: Yes Driving: Yes Vocation: Retired Leisure: Hobbies-yes (Comment) (manages his property) Vision Baseline Vision/History: 1 Wears glasses Ability to See in Adequate Light: 0 Adequate Patient Visual Report: No change from baseline Vision Assessment?: No apparent visual deficits Perception  Perception: Within Functional Limits Praxis Praxis: WFL Cognition Cognition Overall Cognitive Status: Impaired/Different from baseline Arousal/Alertness: Awake/alert Orientation Level: Person;Place;Situation Person: Oriented Place: Oriented Situation: Oriented Memory: Appears intact Attention: Selective Sustained Attention: Appears intact Selective Attention: Appears intact Awareness: Appears intact Awareness Impairment: Emergent impairment;Anticipatory impairment Problem Solving: Impaired Problem Solving Impairment: Functional basic Safety/Judgment: Impaired Brief Interview for Mental Status (BIMS) Repetition of Three Words (First Attempt): 3 Temporal Orientation: Year: Correct Temporal Orientation: Month: Accurate within 5 days Temporal Orientation: Day: Incorrect Recall: Sock: Yes, no cue required Recall: Blue: Yes, no cue required Recall: Bed: Yes, no cue required BIMS Summary Score: 14 Sensation Sensation Light Touch:  Appears Intact Hot/Cold: Not tested Proprioception: Appears Intact Stereognosis: Not tested Coordination Gross Motor Movements are Fluid and Coordinated: No Fine Motor Movements are Fluid and Coordinated: No Coordination and Movement Description: L hemiparesis Motor  Motor Motor: Hemiplegia;Abnormal tone;Motor impersistence Motor - Skilled Clinical Observations: left hemiparesis UE>LE  Trunk/Postural Assessment  Cervical Assessment Cervical Assessment: Within Functional Limits Thoracic Assessment Thoracic Assessment: Within Functional Limits Lumbar Assessment Lumbar Assessment: Within Functional Limits Postural Control Postural Control: Deficits on evaluation Trunk Control: tends toward  flexed posture Righting Reactions: delayed toward left  Balance Balance Balance Assessed: Yes Standardized Balance Assessment Standardized Balance Assessment: Timed Up and Go Test Timed Up and Go Test TUG: Normal TUG Normal TUG (seconds): 53 (with HHA) Static Sitting Balance Static Sitting - Balance Support: Feet supported;No upper extremity supported Static Sitting - Level of Assistance: 5: Stand by assistance Dynamic Sitting Balance Dynamic Sitting - Balance Support: Feet supported;No upper extremity supported Dynamic Sitting - Level of Assistance: 4: Min Oncologist Standing - Balance Support: Right upper extremity supported Static Standing - Level of Assistance: 4: Min assist Dynamic Standing Balance Dynamic Standing - Balance Support: Right upper extremity supported;During functional activity Dynamic Standing - Level of Assistance: 3: Mod assist Extremity/Trunk Assessment RUE Assessment RUE Assessment: Within Functional Limits LUE Assessment LUE Assessment: Exceptions to [facility] Active Range of Motion (AROM) Comments: left hemiplegia - has delayed response to shoulder flex/ext LUE Body System: Neuro Brunstrum levels for arm and hand: Arm;Hand Brunstrum  level for arm: Stage II Synergy is developing Brunstrum level for hand: Stage I Flaccidity  Care Tool Care Tool Self Care Eating Eating activity did not occur: Refused      Oral Care    Oral Care Assist Level: Total assistance - Patient < 25%    Bathing   Body parts bathed by patient: Left arm;Chest;Abdomen;Front perineal area;Buttocks;Right upper leg;Left upper leg;Face Body parts bathed by helper: Right arm;Right lower leg;Left lower leg   Assist Level: Moderate Assistance - Patient 50 - 74%    Upper Body Dressing(including orthotics)   What is the patient wearing?: Pull over shirt   Assist Level: Moderate Assistance - Patient 50 - 74%    Lower Body Dressing (excluding footwear)   What is the patient wearing?: Pants Assist for lower body dressing: Moderate Assistance - Patient 50 - 74%    Putting on/Taking off footwear   What is the patient wearing?: Shoes Assist for footwear: Minimal Assistance - Patient > 75%       Care Tool Toileting Toileting activity   Assist for toileting: Moderate Assistance - Patient 50 - 74%     Care Tool Bed Mobility Roll left and right activity   Roll left and right assist level: Moderate Assistance - Patient 50 - 74%    Sit to lying activity   Sit to lying assist level: Moderate Assistance - Patient 50 - 74%    Lying to sitting on side of bed activity   Lying to sitting on side of bed assist level: the ability to move from lying on the back to sitting on the side of the bed with no back support.: Moderate Assistance - Patient 50 - 74%     Care Tool Transfers Sit to stand transfer   Sit to stand assist level: Minimal Assistance - Patient > 75%    Chair/bed transfer   Chair/bed transfer assist level: Moderate Assistance - Patient 50 - 74%     Toilet transfer   Assist Level: Moderate Assistance - Patient 50 - 74%     Care Tool Cognition  Expression of Ideas and Wants Expression of Ideas and Wants: 4. Without difficulty (complex  and basic) - expresses complex messages without difficulty and with speech that is clear and easy to understand  Understanding Verbal and Non-Verbal Content Understanding Verbal and Non-Verbal Content: 4. Understands (complex and basic) - clear comprehension without cues or repetitions   Memory/Recall Ability Memory/Recall Ability : Current season;That he or she is in  a hospital/hospital unit   Refer to Care Plan for Long Term Goals  SHORT TERM GOAL WEEK 1 OT Short Term Goal 1 (Week 1): Patient will pull pants down for toileting with increased time and contact guard assist OT Short Term Goal 2 (Week 1): Patient will push left arm nto sleeve of shirt with min cueing OT Short Term Goal 3 (Week 1): Patient will transfer to/from shower with min assist OT Short Term Goal 4 (Week 1): Patient will position left arm while up in bed or in wheelchair with min cueing  Recommendations for other services: None    Skilled Therapeutic Intervention: Patient received supine in bed.  Patient agreeable to OT evaluation.  Reviewed role of OT and potential goals for therapy.  Patient somewhat apprehensive with sitting balance initially - leaning backward.  Did better once feet on floor.  Reported initial light headedness which cleared in sitting.  Patient completed bathing and dressing at sink as indicated below - declined shower x 2.  Patient left up in chair at end of session with handoff to NT who was asked to apply safety belt.   ADL ADL Eating: Unable to assess Grooming: Moderate assistance Where Assessed-Grooming: Sitting at sink Upper Body Bathing: Moderate assistance Where Assessed-Upper Body Bathing: Sitting at sink Lower Body Bathing: Moderate assistance Where Assessed-Lower Body Bathing: Sitting at sink;Standing at sink Upper Body Dressing: Moderate assistance Where Assessed-Upper Body Dressing: Sitting at sink Lower Body Dressing: Moderate assistance Where Assessed-Lower Body Dressing: Sitting at  sink Toileting: Moderate assistance Where Assessed-Toileting: Teacher, Adult Education: Moderate assistance Toilet Transfer Method: Stand pivot Tub/Shower Transfer: Unable to assess Tub/Shower Transfer Method: Unable to assess Film/video Editor: Unable to assess Mobility  Bed Mobility Bed Mobility: Rolling Right;Right Sidelying to Sit Rolling Right: Moderate Assistance - Patient 50-74% Right Sidelying to Sit: Moderate Assistance - Patient 50-74% Transfers Sit to Stand: Minimal Assistance - Patient > 75% Stand to Sit: Minimal Assistance - Patient > 75%   Discharge Criteria: Patient will be discharged from OT if patient refuses treatment 3 consecutive times without medical reason, if treatment goals not met, if there is a change in medical status, if patient makes no progress towards goals or if patient is discharged from hospital.  The above assessment, treatment plan, treatment alternatives and goals were discussed and mutually agreed upon: by patient  Fransico Allean HERO 05/03/2024, 3:04 PM

## 2024-05-03 NOTE — Progress Notes (Addendum)
 Patient ID: Robert Everett, male   DOB: Aug 23, 1933, 89 y.o.   MRN: [id number]  Have reviewed team conference with pt and family. Both aware and agreeable with targeted d/c date of 01/28 and goals of Supervision/Verbal cueing.  He has a cane at home along with some other DME. He will have the support of his wife, daughter and grandson who will provide transportation until he is able to drive again.  Statement of service delivered.

## 2024-05-03 NOTE — Progress Notes (Signed)
 "                                                        PROGRESS NOTE   Subjective/Complaints: No new complaints this morning Labs stable this morning Patient's chart reviewed- No issues reported overnight Diastolic BP is soft  ROS: +left sided weakness   Objective:   EP STUDY Result Date: 05/02/2024 See surgical note for result.  Recent Labs    05/03/24 0429  WBC 7.8  HGB 13.1  HCT 39.3  PLT 152   Recent Labs    05/03/24 0429  NA 138  K 4.5  CL 104  CO2 26  GLUCOSE 113*  BUN 21  CREATININE 0.90  CALCIUM  9.2    Intake/Output Summary (Last 24 hours) at 05/03/2024 1102 Last data filed at 05/03/2024 0744 Gross per 24 hour  Intake 236 ml  Output 1175 ml  Net -939 ml        Physical Exam: Vital Signs Blood pressure (!) 129/43, pulse 65, temperature 98.4 F (36.9 C), temperature source Oral, resp. rate 20, height 5' 10 (1.778 m), weight 68 kg, SpO2 96%. Gen: no distress, normal appearing HEENT: oral mucosa pink and moist, NCAT Cardio: Reg rate Chest: normal effort, normal rate of breathing Abd: soft, non-distended Ext: no edema Psych: pleasant, normal affect Skin: intact Neuro: Alert and oriented x3, LUE strength is 2/5 in EF and EE, LLE is 3/5 throughout, sensation is intact. Hard of hearing   Assessment/Plan: 1. Functional deficits which require 3+ hours per day of interdisciplinary therapy in a comprehensive inpatient rehab setting. Physiatrist is providing close team supervision and 24 hour management of active medical problems listed below. Physiatrist and rehab team continue to assess barriers to discharge/monitor patient progress toward functional and medical goals  Care Tool:  Bathing              Bathing assist       Upper Body Dressing/Undressing Upper body dressing        Upper body assist      Lower Body Dressing/Undressing Lower body dressing            Lower body assist       Toileting Toileting    Toileting  assist       Transfers Chair/bed transfer  Transfers assist           Locomotion Ambulation   Ambulation assist              Walk 10 feet activity   Assist           Walk 50 feet activity   Assist           Walk 150 feet activity   Assist           Walk 10 feet on uneven surface  activity   Assist           Wheelchair     Assist               Wheelchair 50 feet with 2 turns activity    Assist            Wheelchair 150 feet activity     Assist          Blood pressure (!) 129/43, pulse 65, temperature 98.4 F (36.9  C), temperature source Oral, resp. rate 20, height 5' 10 (1.778 m), weight 68 kg, SpO2 96%.  Medical Problem List and Plan: 1. 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  Chronic LLE DVT/ Antithrombotics: -DVT/anticoagulation:  Pharmaceutical: continue Lovenox              -antiplatelet therapy: DAPT X 3 weeks followed by Plavix  alone.   3. Chronic LBP/ Pain Management: Tylenol  prn.   4. Mood/Behavior/Sleep: LCSW to follow for evaluation and support             --Melatonin prn for insomnia.              -antipsychotic agents: N/A  5. Neuropsych/cognition: This patient is capable of making decisions on his own behalf. 6. Skin/Wound Care: Routine pressure relief measures.   7. Constipation: milk of mag ordered 1/14  8. Diastolic hypotension: continue to monitor BP TID  9.  Thrombocytopenia: Was down to 134-->149 and recovering. Recheck in am.  10. Moderate to severe aortic regurgitation: BC X 2 negative so far. S/p TEE  11. Dyslipidemia: Continue Liptor @ 40 mg.     LOS: 1 days A FACE TO FACE EVALUATION WAS PERFORMED  Kambrie Eddleman P Avery Klingbeil 05/03/2024, 11:02 AM     "

## 2024-05-03 NOTE — Plan of Care (Signed)
" °  Problem: RH Problem Solving Goal: LTG Patient will demonstrate problem solving for (SLP) Description: LTG:  Patient will demonstrate problem solving for basic/complex daily situations with cues  (SLP) Flowsheets (Taken 05/03/2024 0854) LTG: Patient will demonstrate problem solving for (SLP): (mildly complex) Other (comment) LTG Patient will demonstrate problem solving for: Supervision   Problem: RH Awareness Goal: LTG: Patient will demonstrate awareness during functional activites type of (SLP) Description: LTG: Patient will demonstrate awareness during functional activites type of (SLP) Flowsheets (Taken 05/03/2024 0854) Patient will demonstrate during cognitive/linguistic activities awareness type of: Intellectual LTG: Patient will demonstrate awareness during cognitive/linguistic activities with assistance of (SLP): Supervision   "

## 2024-05-03 NOTE — Patient Care Conference (Signed)
 Inpatient RehabilitationTeam Conference and Plan of Care Update Date: 05/03/2024   Time: 11:28 AM    Patient Name: Robert Everett      Medical Record Number: [id number]  Date of Birth: Mar 22, 1934 Sex: Male         Room/Bed: 4W02C/4W02C-01 Payor Info: Payor: [employer] MEDICARE / Plan: [facility] MEDICARE / Product Type: *No Product type* /    Admit Date/Time:  05/02/2024  5:11 PM  Primary Diagnosis:  Small vessel stroke [facility])  Hospital Problems: Principal Problem:   Small vessel stroke [facility])    Expected Discharge Date: Expected Discharge Date: 05/17/24  Team Members Present: Physician leading conference: Dr. Sven Elks Social Worker Present: Waverly Gentry, LCSW-A Nurse Present: Barnie Ronde, RN PT Present: Sherlean Perks, PT OT Present: Monica Peacock, OT SLP Present: Rosina Downy, SLP     Current Status/Progress Goal Weekly Team Focus  Bowel/Bladder   Patient is continent of bowel and bladder LBM 05/02/24   Remain continent of Bowel and bladder   Timed toileting q shift and PRN    Swallow/Nutrition/ Hydration   eval pending           ADL's   mod assist BADL, Left hemiplegia UE>LE   Intermittent min assist   improve function in left hemibody    Mobility   PT EVAL PENDING           Communication   eval pending            Safety/Cognition/ Behavioral Observations  eval pending            Pain   pt states that he has discomfort in throat since TEE   Patient denies discomfort   patient given soothing cough drops PRN to relieve discomfort    Skin   pt skin is intact   Skin integrity remain intact and free of breakdown  skin assessment qshift and PRN      Discharge Planning:  Home with spouse and support from daughter and grandson. Has some DME..will await verification on cane. Awaiting therapy follow-up recommendations.   Team Discussion: Patient admitted post multi cryptogenic CVAs with SVD, left upper extremity hemiplegia and left lower  extremity weakness and dysarthria.   Patient on target to meet rehab goals: Evals pending  *See Care Plan and progress notes for long and short-term goals.   Revisions to Treatment Plan:  N/a   Teaching Needs: Safety, medications, transfers, toileting, etc.   Current Barriers to Discharge: Decreased caregiver support and Home enviroment access/layout  Possible Resolutions to Barriers: Family education Ramp to entry of home recommended     Medical Summary Current Status: cryptogenic strokes, systolic hypertension and diastolic hypotension, hyperglycemia, left sided weakness  Barriers to Discharge: Medical stability  Barriers to Discharge Comments: cryptogenic strokes, systolic hypertension and diastolic hypotension, hyperglycemia, left sided weakness Possible Resolutions to [employer] Focus: continue aspirin , plavix , statin, lovenox , multivitamin, continue to monitor blood pressure TID, provide dietary education   Continued Need for Acute Rehabilitation Level of Care: The patient requires daily medical management by a physician with specialized training in physical medicine and rehabilitation for the following reasons: Direction of a multidisciplinary physical rehabilitation program to maximize functional independence : Yes Medical management of patient stability for increased activity during participation in an intensive rehabilitation regime.: Yes Analysis of laboratory values and/or radiology reports with any subsequent need for medication adjustment and/or medical intervention. : Yes   I attest that I was present, lead the team conference, and concur with  the assessment and plan of the team.   [name] 05/03/2024, 4:26 PM

## 2024-05-03 NOTE — Evaluation (Signed)
 Physical Therapy Assessment and Plan  Patient Details  Name: Robert Everett MRN: [id number] Date of Birth: 1933/08/27  PT Diagnosis: Abnormality of gait, Cognitive deficits, Difficulty walking, Hemiparesis non-dominant, Hypotonia, Impaired cognition, Low back pain, and Muscle weakness Rehab Potential: Good ELOS: 12-14 days   Today's Date: 05/03/2024 PT Individual Time: 1330-1445 PT Individual Time Calculation (min): 75 min    Hospital Problem: Principal Problem:   Small vessel stroke (HCC)   Past Medical History:  Past Medical History:  Diagnosis Date   Arthritis    hands & shoulder    Cancer (HCC)    basal cell- nose   Diverticulosis    DVT (deep venous thrombosis) (HCC) 04/20/2005   treated with coumadin- x1    H/O exercise stress test 2000?   done following what they thought was an TIA, told all was wnll   Prostate enlargement    takes saw palmetto    Pulmonary embolism (HCC) 04/20/2005   with DVT   Recurrent acute deep vein thrombosis (DVT) of left lower extremity (HCC) 07/05/2014   treated with Xarelto    Seasonal allergies    uses Flonase nasal spray   Stroke [facility])    TIA- 1990's   Past Surgical History:  Past Surgical History:  Procedure Laterality Date   INSERTION OF MESH Bilateral 06/28/2014   Procedure: INSERTION OF MESH;  Surgeon: Vicenta Poli, MD;  Location: [facility] OR;  Service: General;  Laterality: Bilateral;   LAPAROSCOPIC INGUINAL HERNIA WITH UMBILICAL HERNIA Bilateral 06/28/2014   Procedure: LAPAROSCOPIC RIGHT AND LEFT  INGUINAL HERNIA WITH UMBILICAL HERNIA  AND MESH;  Surgeon: Vicenta Poli, MD;  Location: [facility] OR;  Service: General;  Laterality: Bilateral;   polyps removed   2010   on colonoscopy   SHOULDER SURGERY Right    rotator cuff repair , x2 on the same shoulder   TONSILLECTOMY     TRANSESOPHAGEAL ECHOCARDIOGRAM (CATH LAB) N/A 05/02/2024   Procedure: TRANSESOPHAGEAL ECHOCARDIOGRAM;  Surgeon: Claudene Pacific, MD;  Location: [facility] INVASIVE CV LAB;   Service: Cardiovascular;  Laterality: N/A;    Assessment & Plan Clinical Impression: Patient is a 89 year old RH-male with history of TIA, PE/DVT, fall w/Left orbital Fx with anisocoria '07, right shoulder surgery,  L3 compression Fx s/p kyphoplasty 10/25, who was admitted on 04/27/24 with left sided weakness and left facial droop which was noted on getting up in the morning. UDS negative. CTA was negative for LVO and CTP without mismatch. MRI brain done revealing acute infarct in right corona radiata and multiple remote infarcts including left superior frontal gyrus, right occipital lobe and cerebellum. Dr. Rosemarie felt that stroke was due to small vessel disease and recommended DAPT X 3 weeks followed by Plavix  alone.    BLE dopplers showed chronic DVT in left popliteal vein. 2D echo showed EF 60-65% with moderate to severe AR and was positive for IA shunt.  Dr. Claudene consulted and recommended [facility] X 2 to rule out endocarditis as well as TEE for work up which is to be done today. PT/OT consulted and has been working with patient. He required min assist for transfers and mobility with decrease in left foot clearance and slight posterior lean as well as mod to max assist for ADLs. He was independent PTA and CIR recommended due to functional decline.  Patient transferred to CIR on 05/02/2024 .   Patient currently requires mod with mobility secondary to muscle weakness, decreased cardiorespiratoy endurance, abnormal tone and unbalanced muscle activation, decreased awareness, decreased  problem solving, and decreased safety awareness, and decreased sitting balance, decreased standing balance, hemiplegia, and decreased balance strategies.  Prior to hospitalization, patient was modified independent  with mobility and lived with Spouse in a House home.  Home access is  Ramped entrance.  Patient will benefit from skilled PT intervention to maximize safe functional mobility, minimize fall risk, and decrease caregiver  burden for planned discharge home with 24 hour assist.  Anticipate patient will benefit from follow up [facility] at discharge.  PT - End of Session Activity Tolerance: Tolerates < 10 min activity, no significant change in vital signs Endurance Deficit: Yes Endurance Deficit Description: quick to fatigue, needs extended seated rest breaks b/w mobility tasks PT Assessment Rehab Potential (ACUTE/IP ONLY): Good PT Barriers to Discharge: Decreased caregiver support;Home environment access/layout;Lack of/limited family support PT Patient demonstrates impairments in the following area(s): Balance;Endurance;Motor;Safety;Skin Integrity PT Transfers Functional Problem(s): Bed Mobility;Bed to Chair;Car PT Locomotion Functional Problem(s): Ambulation;Wheelchair Mobility;Stairs PT Plan PT Intensity: Minimum of 1-2 x/day ,45 to 90 minutes PT Frequency: 5 out of 7 days PT Duration Estimated Length of Stay: 12-14 days PT Treatment/Interventions: Ambulation/gait training;Cognitive remediation/compensation;Discharge planning;DME/adaptive equipment instruction;Functional mobility training;Pain management;Psychosocial support;Therapeutic Activities;Splinting/orthotics;UE/LE Strength taining/ROM;Visual/perceptual remediation/compensation;Wheelchair propulsion/positioning;UE/LE Coordination activities;Therapeutic Exercise;Stair training;Skin care/wound management;Patient/family education;Neuromuscular re-education;Functional electrical stimulation;Disease management/prevention;Community reintegration;Balance/vestibular training PT Transfers Anticipated Outcome(s): CGA PT Locomotion Anticipated Outcome(s): CGA PT Recommendation Recommendations for Other Services: Neuropsych consult Follow Up Recommendations: Home health PT;24 hour supervision/assistance;Outpatient PT Patient destination: Home Equipment Recommended: To be determined   PT Evaluation Precautions/Restrictions Precautions Precautions:  Fall Precaution/Restrictions Comments: hard of hearing Restrictions Weight Bearing Restrictions Per Provider Order: No Pain Interference Pain Interference Pain Effect on Sleep: 0. Does not apply - I have not had any pain or hurting in the past 5 days Pain Interference with Therapy Activities: 0. Does not apply - I have not received rehabilitationtherapy in the past 5 days Pain Interference with Day-to-Day Activities: 1. Rarely or not at all Home Living/Prior Functioning Home Living Available Help at Discharge: Family;Available 24 hours/day Type of Home: House Home Access: Ramped entrance Home Layout: One level Bathroom Shower/Tub: Nurse, Adult Accessibility: Yes Additional Comments: uses urinal at night  Lives With: Spouse Prior Function Level of Independence: Independent with basic ADLs;Independent with homemaking with ambulation;Independent with gait;Requires assistive device for independence  Able to Take Stairs?: Yes Driving: Yes Vocation: Retired Leisure: Hobbies-yes (Comment) (manages his property) Vision/Perception  Vision - History Ability to See in Adequate Light: 0 Adequate Perception Perception: Within Functional Limits Praxis Praxis: WFL  Cognition Overall Cognitive Status: Impaired/Different from baseline Arousal/Alertness: Awake/alert Orientation Level: Oriented X4 Attention: Selective Sustained Attention: Appears intact Selective Attention: Appears intact Memory: Appears intact Awareness: Appears intact Awareness Impairment: Emergent impairment;Anticipatory impairment Problem Solving: Impaired Problem Solving Impairment: Functional basic Safety/Judgment: Impaired Sensation Sensation Light Touch: Appears Intact Hot/Cold: Not tested Proprioception: Appears Intact Stereognosis: Not tested Coordination Gross Motor Movements are Fluid and Coordinated: No Fine Motor Movements are Fluid and Coordinated: No Coordination and Movement  Description: L hemiparesis Motor  Motor Motor: Hemiplegia;Abnormal tone;Motor impersistence Motor - Skilled Clinical Observations: left hemiparesis UE>LE   Trunk/Postural Assessment  Cervical Assessment Cervical Assessment: Within Functional Limits Thoracic Assessment Thoracic Assessment: Within Functional Limits Lumbar Assessment Lumbar Assessment: Within Functional Limits Postural Control Postural Control: Deficits on evaluation Trunk Control: tends toward flexed posture Righting Reactions: delayed toward left  Balance Balance Balance Assessed: Yes Standardized Balance Assessment Standardized Balance Assessment: Timed Up and Go Test Timed Up and Go Test TUG: Normal TUG Normal  TUG (seconds): 53 (with HHA) Static Sitting Balance Static Sitting - Balance Support: Feet supported;No upper extremity supported Static Sitting - Level of Assistance: 5: Stand by assistance Dynamic Sitting Balance Dynamic Sitting - Balance Support: Feet supported;No upper extremity supported Dynamic Sitting - Level of Assistance: 4: Min Oncologist Standing - Balance Support: Right upper extremity supported Static Standing - Level of Assistance: 4: Min assist Dynamic Standing Balance Dynamic Standing - Balance Support: Right upper extremity supported;During functional activity Dynamic Standing - Level of Assistance: 3: Mod assist Extremity Assessment  RUE Assessment RUE Assessment: Within Functional Limits LUE Assessment LUE Assessment: Exceptions to [facility] Active Range of Motion (AROM) Comments: left hemiplegia - has delayed response to shoulder flex/ext LUE Body System: Neuro Brunstrum levels for arm and hand: Arm;Hand Brunstrum level for arm: Stage II Synergy is developing Brunstrum level for hand: Stage I Flaccidity RLE Assessment RLE Assessment: Within Functional Limits LLE Assessment LLE Assessment: Exceptions to [facility] General Strength Comments: Grossly  3+/5  Care Tool Care Tool Bed Mobility Roll left and right activity   Roll left and right assist level: Moderate Assistance - Patient 50 - 74%    Sit to lying activity   Sit to lying assist level: Moderate Assistance - Patient 50 - 74%    Lying to sitting on side of bed activity   Lying to sitting on side of bed assist level: the ability to move from lying on the back to sitting on the side of the bed with no back support.: Moderate Assistance - Patient 50 - 74%     Care Tool Transfers Sit to stand transfer   Sit to stand assist level: Minimal Assistance - Patient > 75%    Chair/bed transfer   Chair/bed transfer assist level: Moderate Assistance - Patient 50 - 74%    Car transfer   Car transfer assist level: Moderate Assistance - Patient 50 - 74%      Care Tool Locomotion Ambulation   Assist level: Moderate Assistance - Patient 50 - 74% Assistive device: Hand held assist Max distance: 175'  Walk 10 feet activity   Assist level: Moderate Assistance - Patient - 50 - 74% Assistive device: Hand held assist   Walk 50 feet with 2 turns activity   Assist level: Moderate Assistance - Patient - 50 - 74% Assistive device: Hand held assist  Walk 150 feet activity   Assist level: Moderate Assistance - Patient - 50 - 74% Assistive device: Hand held assist  Walk 10 feet on uneven surfaces activity Walk 10 feet on uneven surfaces activity did not occur: Safety/medical concerns      Stairs   Assist level: Moderate Assistance - Patient - 50 - 74% Stairs assistive device: 1 hand rail Max number of stairs: 4  Walk up/down 1 step activity   Walk up/down 1 step (curb) assist level: Moderate Assistance - Patient - 50 - 74% Walk up/down 1 step or curb assistive device: 1 hand rail  Walk up/down 4 steps activity   Walk up/down 4 steps assist level: Moderate Assistance - Patient - 50 - 74% Walk up/down 4 steps assistive device: 1 hand rail  Walk up/down 12 steps activity Walk up/down 12  steps activity did not occur: Safety/medical concerns      Pick up small objects from floor Pick up small object from the floor (from standing position) activity did not occur: Safety/medical concerns      Wheelchair Is the patient using a wheelchair?: Yes  Type of Wheelchair: Manual   Wheelchair assist level: Minimal Assistance - Patient > 75% Max wheelchair distance: 74'  Wheel 50 feet with 2 turns activity   Assist Level: Moderate Assistance - Patient 50 - 74%  Wheel 150 feet activity   Assist Level: Total Assistance - Patient < 25%    Refer to Care Plan for Long Term Goals  SHORT TERM GOAL WEEK 1 PT Short Term Goal 1 (Week 1): Pt will complete bed mobility with minA PT Short Term Goal 2 (Week 1): Pt will complete bed<>chair transfers with minA PT Short Term Goal 3 (Week 1): Pt will ambulate 150' with minA and LRAD PT Short Term Goal 4 (Week 1): Pt will improve TUG by at least 8 seconds  Recommendations for other services: Neuropsych  Skilled Therapeutic Intervention Mobility Bed Mobility Bed Mobility: Rolling Right;Right Sidelying to Sit Rolling Right: Moderate Assistance - Patient 50-74% Right Sidelying to Sit: Moderate Assistance - Patient 50-74% Transfers Transfers: Sit to Stand;Stand to Sit;Stand Pivot Transfers Sit to Stand: Minimal Assistance - Patient > 75% Stand to Sit: Minimal Assistance - Patient > 75% Stand Pivot Transfers: Moderate Assistance - Patient 50 - 74% Stand Pivot Transfer Details: Tactile cues for weight shifting;Tactile cues for sequencing;Tactile cues for posture Transfer (Assistive device): None Locomotion  Gait Ambulation: Yes Gait Assistance: Moderate Assistance - Patient 50-74% Gait Distance (Feet): 175 Feet Assistive device: 1 person hand held assist Gait Assistance Details: Tactile cues for initiation;Tactile cues for sequencing;Tactile cues for weight shifting;Tactile cues for posture;Tactile cues for placement;Verbal cues for  technique;Verbal cues for gait pattern;Verbal cues for safe use of DME/AE;Verbal cues for precautions/safety Gait Gait: Yes Gait Pattern: Impaired Gait Pattern: Step-through pattern;Lateral trunk lean to left;Trunk flexed;Narrow base of support Stairs / Additional Locomotion Stairs: Yes Stairs Assistance: Moderate Assistance - Patient 50 - 74% Stair Management Technique: One rail Right;Step to pattern;Forwards Number of Stairs: 4 Height of Stairs: 6 Wheelchair Mobility Wheelchair Mobility: Yes Wheelchair Assistance: Minimal assistance - Patient >75% Wheelchair Propulsion: Right upper extremity;Both lower extermities Wheelchair Parts Management: Needs assistance Distance: 60'   Treatment: Pt sitting in the wheelchair on arrival - agreeable to PT evaluation. Pt reports no resting pain but then later reports minimal back pain from a prior surgery. Pain does not interfere with his therapy. Pt hard of hearing, needing loud voice to communicate - hearing aides charging.   Pt assessed in functional mobility as outlined above. Overall, needs modA with R HHA for functional mobility. He presents with dense LUE weakness with low tone, impaired standing balance, and generalized deconditioning and weakness. He has fair insight but will continue to assess cognition in functional context. He completed the TUG in 53 seconds with R HHA - indicative of increased falls risk. Pt needing several extended seated rest breaks throughout assessment for recovery, significant deconditioning and debility noted.   Pt concluded session in bed with his needs met.   Instructed pt in results of PT evaluation as detailed above, PT POC, rehab potential, rehab goals, and discharge recommendations. Additionally discussed CIR's policies regarding fall safety and use of chair alarm and/or quick release belt. Pt verbalized understanding and in agreement. Will update pt's family members as they become available.   Discharge  Criteria: Patient will be discharged from PT if patient refuses treatment 3 consecutive times without medical reason, if treatment goals not met, if there is a change in medical status, if patient makes no progress towards goals or if patient is discharged from hospital.  The above assessment, treatment plan, treatment alternatives and goals were discussed and mutually agreed upon: by patient  [name]  PT, DPT, CSRS  05/03/2024, 2:12 PM

## 2024-05-03 NOTE — Plan of Care (Signed)
 " Problem: RH Balance Goal: LTG: Patient will maintain dynamic sitting balance (OT) Description: LTG:  Patient will maintain dynamic sitting balance with assistance during activities of daily living (OT) Flowsheets (Taken 05/03/2024 1507) LTG: Pt will maintain dynamic sitting balance during ADLs with: Independent Goal: LTG Patient will maintain dynamic standing with ADLs (OT) Description: LTG:  Patient will maintain dynamic standing balance with assist during activities of daily living (OT)  Flowsheets (Taken 05/03/2024 1507) LTG: Pt will maintain dynamic standing balance during ADLs with: Independent with assistive device   Problem: Sit to Stand Goal: LTG:  Patient will perform sit to stand in prep for activites of daily living with assistance level (OT) Description: LTG:  Patient will perform sit to stand in prep for activites of daily living with assistance level (OT) Flowsheets (Taken 05/03/2024 1507) LTG: PT will perform sit to stand in prep for activites of daily living with assistance level: Independent with assistive device   Problem: RH Eating Goal: LTG Patient will perform eating w/assist, cues/equip (OT) Description: LTG: Patient will perform eating with assist, with/without cues using equipment (OT) Flowsheets (Taken 05/03/2024 1507) LTG: Pt will perform eating with assistance level of: Independent with assistive device    Problem: RH Grooming Goal: LTG Patient will perform grooming w/assist,cues/equip (OT) Description: LTG: Patient will perform grooming with assist, with/without cues using equipment (OT) Flowsheets (Taken 05/03/2024 1507) LTG: Pt will perform grooming with assistance level of: Independent with assistive device    Problem: RH Bathing Goal: LTG Patient will bathe all body parts with assist levels (OT) Description: LTG: Patient will bathe all body parts with assist levels (OT) Flowsheets (Taken 05/03/2024 1507) LTG: Pt will perform bathing with assistance  level/cueing: Minimal Assistance - Patient > 75%   Problem: RH Dressing Goal: LTG Patient will perform upper body dressing (OT) Description: LTG Patient will perform upper body dressing with assist, with/without cues (OT). Flowsheets (Taken 05/03/2024 1507) LTG: Pt will perform upper body dressing with assistance level of: Set up assist Goal: LTG Patient will perform lower body dressing w/assist (OT) Description: LTG: Patient will perform lower body dressing with assist, with/without cues in positioning using equipment (OT) Flowsheets (Taken 05/03/2024 1507) LTG: Pt will perform lower body dressing with assistance level of: Minimal Assistance - Patient > 75%   Problem: RH Toileting Goal: LTG Patient will perform toileting task (3/3 steps) with assistance level (OT) Description: LTG: Patient will perform toileting task (3/3 steps) with assistance level (OT)  Flowsheets (Taken 05/03/2024 1507) LTG: Pt will perform toileting task (3/3 steps) with assistance level: Minimal Assistance - Patient > 75%   Problem: RH Functional Use of Upper Extremity Goal: LTG Patient will use RT/LT upper extremity as a (OT) Description: LTG: Patient will use right/left upper extremity as a stabilizer/gross assist/diminished/nondominant/dominant level with assist, with/without cues during functional activity (OT) Flowsheets (Taken 05/03/2024 1507) LTG: Use of upper extremity in functional activities: LUE as a stabilizer LTG: Pt will use upper extremity in functional activity with assistance level of: Minimal Assistance - Patient > 75%   Problem: RH Toilet Transfers Goal: LTG Patient will perform toilet transfers w/assist (OT) Description: LTG: Patient will perform toilet transfers with assist, with/without cues using equipment (OT) Flowsheets (Taken 05/03/2024 1507) LTG: Pt will perform toilet transfers with assistance level of: Independent with assistive device   Problem: RH Tub/Shower Transfers Goal: LTG Patient  will perform tub/shower transfers w/assist (OT) Description: LTG: Patient will perform tub/shower transfers with assist, with/without cues using equipment (OT) Flowsheets (Taken  05/03/2024 1507) LTG: Pt will perform tub/shower stall transfers with assistance level of: Supervision/Verbal cueing   Problem: RH Attention Goal: LTG Patient will demonstrate this level of attention during functional activites (OT) Description: LTG:  Patient will demonstrate this level of attention during functional activites  (OT) Flowsheets (Taken 05/03/2024 1507) Patient will demonstrate this level of attention during functional activites: Selective Patient will demonstrate above attention level in the following environment: Home LTG: Patient will demonstrate this level of attention during functional activites (OT): Independent   Problem: RH Awareness Goal: LTG: Patient will demonstrate awareness during functional activites type of (OT) Description: LTG: Patient will demonstrate awareness during functional activites type of (OT) Flowsheets (Taken 05/03/2024 1507) Patient will demonstrate awareness during functional activites type of: Anticipatory LTG: Patient will demonstrate awareness during functional activites type of (OT): Minimal Assistance - Patient > 75%   "

## 2024-05-04 DIAGNOSIS — I639 Cerebral infarction, unspecified: Secondary | ICD-10-CM | POA: Diagnosis not present

## 2024-05-04 LAB — CBC
HCT: 38.5 % — ABNORMAL LOW (ref 39.0–52.0)
Hemoglobin: 12.8 g/dL — ABNORMAL LOW (ref 13.0–17.0)
MCH: 32.8 pg (ref 26.0–34.0)
MCHC: 33.2 g/dL (ref 30.0–36.0)
MCV: 98.7 fL (ref 80.0–100.0)
Platelets: 155 K/uL (ref 150–400)
RBC: 3.9 MIL/uL — ABNORMAL LOW (ref 4.22–5.81)
RDW: 13.7 % (ref 11.5–15.5)
WBC: 8.7 K/uL (ref 4.0–10.5)
nRBC: 0 % (ref 0.0–0.2)

## 2024-05-04 LAB — CULTURE, BLOOD (ROUTINE X 2)
Culture: NO GROWTH
Culture: NO GROWTH
Special Requests: ADEQUATE
Special Requests: ADEQUATE

## 2024-05-04 LAB — BASIC METABOLIC PANEL WITH GFR
Anion gap: 7 (ref 5–15)
BUN: 22 mg/dL (ref 8–23)
CO2: 26 mmol/L (ref 22–32)
Calcium: 8.9 mg/dL (ref 8.9–10.3)
Chloride: 105 mmol/L (ref 98–111)
Creatinine, Ser: 1.02 mg/dL (ref 0.61–1.24)
GFR, Estimated: >60 mL/min
Glucose, Bld: 112 mg/dL — ABNORMAL HIGH (ref 70–99)
Potassium: 4.5 mmol/L (ref 3.5–5.1)
Sodium: 137 mmol/L (ref 135–145)

## 2024-05-04 MED ORDER — ENOXAPARIN SODIUM 40 MG/0.4ML IJ SOSY
40.0000 mg | PREFILLED_SYRINGE | INTRAMUSCULAR | Status: DC
  Administered 2024-05-04 – 2024-05-08 (×5): 40 mg via SUBCUTANEOUS
  Filled 2024-05-04 (×5): qty 0.4

## 2024-05-04 MED ORDER — ENOXAPARIN SODIUM 30 MG/0.3ML IJ SOSY: 30.0000 mg | PREFILLED_SYRINGE | INTRAMUSCULAR | Status: DC

## 2024-05-04 NOTE — Progress Notes (Signed)
 " Inpatient Rehabilitation Care Coordinator Assessment and Plan Patient Details  Name: Robert Everett MRN: [id number] Date of Birth: 10-Apr-1934  Today's Date: 05/04/2024  Hospital Problems: Principal Problem:   Small vessel stroke Robert Everett)  Past Medical History:  Past Medical History:  Diagnosis Date   Arthritis    hands & shoulder    Cancer (HCC)    basal cell- nose   Diverticulosis    DVT (deep venous thrombosis) (HCC) 04/20/2005   treated with coumadin- x1    H/O exercise stress test 2000?   done following what they thought was an TIA, told all was wnll   Prostate enlargement    takes saw palmetto    Pulmonary embolism (HCC) 04/20/2005   with DVT   Recurrent acute deep vein thrombosis (DVT) of left lower extremity (HCC) 07/05/2014   treated with Xarelto    Seasonal allergies    uses Flonase nasal spray   Stroke Robert Everett)    TIA- 1990's   Past Surgical History:  Past Surgical History:  Procedure Laterality Date   INSERTION OF MESH Bilateral 06/28/2014   Procedure: INSERTION OF MESH;  Surgeon: Vicenta Poli, MD;  Location: [facility] OR;  Service: General;  Laterality: Bilateral;   LAPAROSCOPIC INGUINAL HERNIA WITH UMBILICAL HERNIA Bilateral 06/28/2014   Procedure: LAPAROSCOPIC RIGHT AND LEFT  INGUINAL HERNIA WITH UMBILICAL HERNIA  AND MESH;  Surgeon: Vicenta Poli, MD;  Location: [facility] OR;  Service: General;  Laterality: Bilateral;   polyps removed   2010   on colonoscopy   SHOULDER SURGERY Right    rotator cuff repair , x2 on the same shoulder   TONSILLECTOMY     TRANSESOPHAGEAL ECHOCARDIOGRAM (CATH LAB) N/A 05/02/2024   Procedure: TRANSESOPHAGEAL ECHOCARDIOGRAM;  Surgeon: Claudene Pacific, MD;  Location: [facility] INVASIVE CV LAB;  Service: Cardiovascular;  Laterality: N/A;   Social History:  reports that he quit smoking about 53 years ago. His smoking use included cigarettes. He has never used smokeless tobacco. He reports that he does not drink alcohol and does not use drugs.  Family /  Support Systems Marital Status: Married How Long?: Over 50 years Patient Roles: Spouse Spouse/Significant Other: Robert Everett Children: Robert Everett Other Supports: Grandson Anticipated Caregiver: Robert Everett Ability/Limitations of Caregiver: Light min A Caregiver Availability: 24/7 Family Dynamics: Supportive family  Social History Preferred language: English Religion: [employer] Education: High school Health Literacy - How often do you need to have someone help you when you read instructions, pamphlets, or other written material from your doctor or pharmacy?: Never Writes: Yes Employment Status: Retired Age Retired: 69 Marine Scientist Issues: N/a Guardian/Conservator: N/a   Abuse/Neglect Abuse/Neglect Assessment Can Be Completed: Yes Physical Abuse: Denies Verbal Abuse: Denies Sexual Abuse: Denies Exploitation of patient/patient's resources: Denies Self-Neglect: Denies  Patient response to: Social Isolation - How often do you feel lonely or isolated from those around you?: Never  Emotional Status Pt's affect, behavior and adjustment status: Adjusting to therapy Recent Psychosocial Issues: None Psychiatric History: None Substance Abuse History: Former smoker  Patient / Field Seismologist, Expectations & Goals Pt/Family understanding of illness & functional limitations: Understanding of illness & functional limitations Premorbid pt/family roles/activities: Active in the community, driving Anticipated changes in roles/activities/participation: Not driving upon discharge 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/goals: Research Officer, Trade Union Agencies: None Premorbid Home Care/DME Agencies: None Transportation available at discharge: Daughter, grandson Is the patient able to respond to transportation needs?: Yes In the past 12 months, has lack of transportation kept you from medical appointments or from  getting medications?: No In the past  12 months, has lack of transportation kept you from meetings, work, or from getting things needed for daily living?: No  Discharge Planning Living Arrangements: Spouse/significant other Support Systems: Spouse/significant other, Children, Other relatives Type of Residence: Private residence Insurance Resources: Media Planner (specify) (ADVERTISING COPYWRITER MEDICARE / [facility] MEDICARE) Financial Resources: Social Security Financial Screen Referred: Yes Living Expenses: Own Money Management: Patient, Spouse Does the patient have any problems obtaining your medications?: No Home Management: Manages own home Patient/Family Preliminary Plans: Plasn to return home with wife, daughter and grandson support, son, Darina to support as needed Care Coordinator Anticipated Follow Up Needs: HH/OP Expected length of stay: 10-12 days  Clinical Impression CSW met with patient/family to introduce herself and complete initial assessment. Patient presented to [facility] following a Small vessel stroke . Patient is able to make needs known. Robert Everett was driving and minimally active in the community to go to appointments and take family members to appointments. Robert Everett will have the minimal assistance of his wife, and assistance from his daughter Robert. His grandson will provide transportation until he can do so himself again.   Robert would like to be the main contact for her father with her mother being second. This was approved by the patient   There were no further needs or concerns at present. CSW will follow up with family and continue to follow. Will provide patient/family with an update as soon as one becomes available.   Robert  Everett 05/04/2024, 2:48 PM    "

## 2024-05-04 NOTE — Progress Notes (Signed)
 Occupational Therapy Session Note  Patient Details  Name: Robert Everett MRN: [id number] Date of Birth: [birth year]  Today's Date: 05/04/2024 OT Individual Time: 1045-1200 OT Individual Time Calculation (min): 75 min    Short Term Goals: Week 1:  OT Short Term Goal 1 (Week 1): Patient will pull pants down for toileting with increased time and contact guard assist OT Short Term Goal 2 (Week 1): Patient will push left arm nto sleeve of shirt with min cueing OT Short Term Goal 3 (Week 1): Patient will transfer to/from shower with min assist OT Short Term Goal 4 (Week 1): Patient will position left arm while up in bed or in wheelchair with min cueing  Skilled Therapeutic Interventions/Progress Updates:   Patient received seated in wheelchair.  Patient declined shower.  Patient a bit apprehensive about going to the gym again, but with explanation of plan to work on left arm, patient agreeable.  Transported patient in wheelchair due to time.  Fabricated left custom wrist splint to promote more neutral position of wrist (versus full flexion.)    Once positioned in splint - patient able to extend 4th and fifth digits.   Transferred via stand step to mat table with emphasis on upright postural control.  Transitioned to supine and patient reports this relieves his back pain.  Worked on isolated control of left arm movement at shoulder and elbow, forearm, wrist and digits.  Did well with drop and catch method to increase awareness and attention to left arm.  Abel to sustain shoulder flex at 90* with elbow extended, able to isolate elbow flex/ext with humerus stabilized.  Patient excited for arm movement.   Patient returned to room and walked to his recliner with mod assist.  Left up in recliner with safety belt in place and engaged and call bell/ personal items in reach.    Therapy Documentation Precautions:  Precautions Precautions: Fall Precaution/Restrictions Comments: hard of  hearing Restrictions Weight Bearing Restrictions Per Provider Order: No   Pain:  4/10 back pain.  Secure messaged MD.  Heat pad ordered.      Therapy/Group: Individual Therapy  Meshulem Onorato M 05/04/2024, 12:10 PM

## 2024-05-04 NOTE — Progress Notes (Signed)
 "                                                        PROGRESS NOTE   Subjective/Complaints: C/o low back pain, kpad ordered Hgb decreased, stool occult ordered, repeat CBC ordered for tomorrow Working with therapy  ROS: +left sided weakness, +low back pain   Objective:   EP STUDY Result Date: 05/02/2024 See surgical note for result.  Recent Labs    05/03/24 0429 05/04/24 0502  WBC 7.8 8.7  HGB 13.1 12.8*  HCT 39.3 38.5*  PLT 152 155   Recent Labs    05/03/24 0429 05/04/24 0502  NA 138 137  K 4.5 4.5  CL 104 105  CO2 26 26  GLUCOSE 113* 112*  BUN 21 22  CREATININE 0.90 1.02  CALCIUM  9.2 8.9    Intake/Output Summary (Last 24 hours) at 05/04/2024 1149 Last data filed at 05/04/2024 0756 Gross per 24 hour  Intake 600 ml  Output 875 ml  Net -275 ml        Physical Exam: Vital Signs Blood pressure (!) 130/55, pulse (!) 51, temperature 97.8 F (36.6 C), temperature source Oral, resp. rate 17, height 5' 10 (1.778 m), weight 68 kg, SpO2 94%. Gen: no distress, normal appearing HEENT: oral mucosa pink and moist, NCAT Cardio: Bradycardia Chest: normal effort, normal rate of breathing Abd: soft, non-distended Ext: no edema Psych: pleasant, normal affect Skin: intact Neuro: Alert and oriented x3, LUE strength is 2/5 in EF and EE, LLE is 3/5 throughout, sensation is intact. Hard of hearing, stable 1/15   Assessment/Plan: 1. Functional deficits which require 3+ hours per day of interdisciplinary therapy in a comprehensive inpatient rehab setting. Physiatrist is providing close team supervision and 24 hour management of active medical problems listed below. Physiatrist and rehab team continue to assess barriers to discharge/monitor patient progress toward functional and medical goals  Care Tool:  Bathing    Body parts bathed by patient: Left arm, Chest, Abdomen, Front perineal area, Buttocks, Right upper leg, Left upper leg, Face   Body parts bathed by  helper: Right arm, Right lower leg, Left lower leg     Bathing assist Assist Level: Moderate Assistance - Patient 50 - 74%     Upper Body Dressing/Undressing Upper body dressing   What is the patient wearing?: Pull over shirt    Upper body assist Assist Level: Moderate Assistance - Patient 50 - 74%    Lower Body Dressing/Undressing Lower body dressing      What is the patient wearing?: Pants     Lower body assist Assist for lower body dressing: Moderate Assistance - Patient 50 - 74%     Toileting Toileting    Toileting assist Assist for toileting: Moderate Assistance - Patient 50 - 74%     Transfers Chair/bed transfer  Transfers assist     Chair/bed transfer assist level: Moderate Assistance - Patient 50 - 74%     Locomotion Ambulation   Ambulation assist      Assist level: Moderate Assistance - Patient 50 - 74% Assistive device: Hand held assist Max distance: 175'   Walk 10 feet activity   Assist     Assist level: Moderate Assistance - Patient - 50 - 74% Assistive device: Hand held assist   Walk 50 feet  activity   Assist    Assist level: Moderate Assistance - Patient - 50 - 74% Assistive device: Hand held assist    Walk 150 feet activity   Assist    Assist level: Moderate Assistance - Patient - 50 - 74% Assistive device: Hand held assist    Walk 10 feet on uneven surface  activity   Assist Walk 10 feet on uneven surfaces activity did not occur: Safety/medical concerns         Wheelchair     Assist Is the patient using a wheelchair?: Yes Type of Wheelchair: Manual    Wheelchair assist level: Minimal Assistance - Patient > 75% Max wheelchair distance: 61'    Wheelchair 50 feet with 2 turns activity    Assist        Assist Level: Moderate Assistance - Patient 50 - 74%   Wheelchair 150 feet activity     Assist      Assist Level: Total Assistance - Patient < 25%   Blood pressure (!) 130/55, pulse (!)  51, temperature 97.8 F (36.6 C), temperature source Oral, resp. rate 17, height 5' 10 (1.778 m), weight 68 kg, SpO2 94%.  Medical Problem List and Plan: 1. 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  Chronic LLE DVT/ Antithrombotics: -DVT/anticoagulation:  Pharmaceutical: continue Lovenox , stool occult ordered since Hgb is decreasing             -antiplatelet therapy: DAPT X 3 weeks followed by Plavix  alone.   3. Chronic LBP/ Pain Management: Tylenol  prn. Kpad ordered  4. Mood/Behavior/Sleep: LCSW to follow for evaluation and support             --Melatonin prn for insomnia.              -antipsychotic agents: N/A  5. Neuropsych/cognition: This patient is capable of making decisions on his own behalf. 6. Skin/Wound Care: Routine pressure relief measures.   7. Constipation: milk of mag ordered 1/14, messaged nursing to see if this had any results  8. Diastolic hypotension: continue to monitor BP TID  9.  Thrombocytopenia: Was down to 134-->149 and recovering. Recheck in am.  10. Moderate to severe aortic regurgitation: BC X 2 negative so far. S/p TEE  11. Dyslipidemia: Continue Liptor @ 40 mg.     LOS: 2 days A FACE TO FACE EVALUATION WAS PERFORMED  Sven SQUIBB Aarilyn Dye 05/04/2024, 11:49 AM     "

## 2024-05-04 NOTE — Progress Notes (Signed)
 Physical Therapy Session Note  Patient Details  Name: Robert Everett MRN: [id number] Date of Birth: 1933/10/31  Today's Date: 05/04/2024 PT Individual Time: 9199-9084 + 8584-8484 PT Individual Time Calculation (min): 75 min  + 60 min  Short Term Goals: Week 1:  PT Short Term Goal 1 (Week 1): Pt will complete bed mobility with minA PT Short Term Goal 2 (Week 1): Pt will complete bed<>chair transfers with minA PT Short Term Goal 3 (Week 1): Pt will ambulate 150' with minA and LRAD PT Short Term Goal 4 (Week 1): Pt will improve TUG by at least 8 seconds  Skilled Therapeutic Interventions/Progress Updates:      1st session: Pt lying in bed to start. He has no reports of pain - asks if he can wear a diaper so that he can avoid accidents on the sheets and clothes.   Supine<>sitting EOB with assist for trunk support. CGA while he scoots forward to EOB. Donned brief, pants, and a clean shirt with maxA. Pt developing some mild learned helplessness so encouraged participation to improve self efficacy.   Sit<>stand with minA and completed stand step transfer with minA and R HHA from EOB to w/c. Cues for controlled lowering to sit and for safety approach to w/c.   Transported at w/c level to main gym for energy conservation.   Gait training 200' with R HHA, minA to start fading to [facility] for the last ~75 as he became fatigue. Pt with improved upright posture and stride length compared to yesterday, relies on HHA for stability and balance. Continues to have L trunk lean that worsens as he fatigues and he can be inconsistent with step length on L.  Introduced narrow QC for AD to facilitate gait and improve functional independence. Provided demonstration of proper sequencing and cane was adjusted to fit.   Pt ambulated 125' with minA using QC - needed a few reps to figure out sequencing of cane and step but was eventually able to get it. He had a forward flexed trunk with rounding of the shoulder and  the weight of his L arm would pull his trunk forward. Used Ace Wrap to support L limb in an improvised sling, needing rolled washcloths b/w trunk and L hand to keep wrist neutral and prevent injury. Pt ambulated an additional 125' + 125' with minA and QC - improved upright posture with arm wrapped, continuing to need assist for balance and trunk support at minA level.     2nd session: Pt sitting in recliner, sleeping but agreeable to therapy once he was woken up. Pt has no complaints of pain. Sit<>stand using QC in R hand with min/modA for powering to rise, minA for balance once standing. He ambulated from his room to day room gym, ~175', with minA and QC - continues to have L trunk lean, step-to pattern at times. Has trouble with cane sequencing as he fatigues. Pt participated in individual treatment for dance group at seated level. Cues for engaging L side as much as possible, patient frequently providing excuses for why he can't use his L side. Pt returned to his room and was assisted to bed at end of treatment to rest. Pt with alarm on, needs met.       Therapy Documentation Precautions:  Precautions Precautions: Fall Precaution/Restrictions Comments: hard of hearing Restrictions Weight Bearing Restrictions Per Provider Order: No General:      Therapy/Group: Individual Therapy  Robert Everett 05/04/2024, 7:39 AM

## 2024-05-05 ENCOUNTER — Encounter (HOSPITAL_COMMUNITY): Payer: Self-pay | Admitting: Physical Medicine and Rehabilitation

## 2024-05-05 DIAGNOSIS — I639 Cerebral infarction, unspecified: Secondary | ICD-10-CM | POA: Diagnosis not present

## 2024-05-05 LAB — CBC WITH DIFFERENTIAL/PLATELET
Abs Immature Granulocytes: 0.02 K/uL (ref 0.00–0.07)
Basophils Absolute: 0.1 K/uL (ref 0.0–0.1)
Basophils Relative: 1 %
Eosinophils Absolute: 0.3 K/uL (ref 0.0–0.5)
Eosinophils Relative: 5 %
HCT: 37.1 % — ABNORMAL LOW (ref 39.0–52.0)
Hemoglobin: 12.9 g/dL — ABNORMAL LOW (ref 13.0–17.0)
Immature Granulocytes: 0 %
Lymphocytes Relative: 26 %
Lymphs Abs: 1.8 K/uL (ref 0.7–4.0)
MCH: 33.5 pg (ref 26.0–34.0)
MCHC: 34.8 g/dL (ref 30.0–36.0)
MCV: 96.4 fL (ref 80.0–100.0)
Monocytes Absolute: 0.8 K/uL (ref 0.1–1.0)
Monocytes Relative: 11 %
Neutro Abs: 4 K/uL (ref 1.7–7.7)
Neutrophils Relative %: 57 %
Platelets: 179 K/uL (ref 150–400)
RBC: 3.85 MIL/uL — ABNORMAL LOW (ref 4.22–5.81)
RDW: 13.6 % (ref 11.5–15.5)
WBC: 6.9 K/uL (ref 4.0–10.5)
nRBC: 0 % (ref 0.0–0.2)

## 2024-05-05 MED ORDER — TAMSULOSIN HCL 0.4 MG PO CAPS
0.4000 mg | ORAL_CAPSULE | Freq: Every day | ORAL | Status: DC
  Administered 2024-05-05 – 2024-05-08 (×4): 0.4 mg via ORAL
  Filled 2024-05-05 (×4): qty 1

## 2024-05-05 MED ORDER — MUSCLE RUB 10-15 % EX CREA
TOPICAL_CREAM | CUTANEOUS | Status: DC | PRN
  Filled 2024-05-05: qty 85

## 2024-05-05 MED ORDER — MAGNESIUM OXIDE -MG SUPPLEMENT 400 (240 MG) MG PO TABS
200.0000 mg | ORAL_TABLET | Freq: Every day | ORAL | Status: DC
  Administered 2024-05-05 – 2024-05-16 (×12): 200 mg via ORAL
  Filled 2024-05-05 (×10): qty 1

## 2024-05-05 NOTE — IPOC Note (Signed)
 Overall Plan of Care [facility]) Patient Details Name: Robert Everett MRN: [id number] DOB: November 26, 1933  Admitting Diagnosis: Small vessel stroke [facility])  Hospital Problems: Principal Problem:   Small vessel stroke (HCC)     Functional Problem List: Nursing Bowel, Medication Management, Safety, Endurance  PT Balance, Endurance, Motor, Safety, Skin Integrity  OT Balance, Cognition, Safety, Endurance, Motor  SLP Cognition  TR         Basic ADLs: OT Eating, Grooming, Bathing, Dressing, Toileting     Advanced  ADLs: OT None     Transfers: PT Bed Mobility, Bed to Chair, Customer Service Manager, Tub/Shower     Locomotion: PT Ambulation, Psychologist, [employer], Stairs     Additional Impairments: OT Fuctional Use of Upper Extremity  SLP Social Cognition   Problem Solving, Awareness  TR      Anticipated Outcomes Item Anticipated Outcome  Self Feeding Mod I  Swallowing      Basic self-care  Min assist  Toileting  Min assist   Bathroom Transfers Supervision  Bowel/Bladder  manage bowel w mod I assist  Transfers  CGA  Locomotion  CGA  Communication     Cognition  supervisionA  Pain  n/a  Safety/Judgment  manage safety w cues   Therapy Plan: PT Intensity: Minimum of 1-2 x/day ,45 to 90 minutes PT Frequency: 5 out of 7 days PT Duration Estimated Length of Stay: 12-14 days OT Intensity: Minimum of 1-2 x/day, 45 to 90 minutes OT Frequency: 5 out of 7 days OT Duration/Estimated Length of Stay: 10-12 days SLP Intensity: Minumum of 1-2 x/day, 30 to 90 minutes SLP Frequency: 1 to 3 out of 7 days SLP Duration/Estimated Length of Stay: 10-12 days   Team Interventions: Nursing Interventions Patient/Family Education, Bowel Management, Disease Management/Prevention, Discharge Planning, Pain Management, Medication Management  PT interventions Ambulation/gait training, Cognitive remediation/compensation, Discharge planning, DME/adaptive equipment instruction, Functional mobility  training, Pain management, Psychosocial support, Therapeutic Activities, Splinting/orthotics, UE/LE Strength taining/ROM, Visual/perceptual remediation/compensation, Wheelchair propulsion/positioning, UE/LE Coordination activities, Therapeutic Exercise, Stair training, Skin care/wound management, Patient/family education, Neuromuscular re-education, Functional electrical stimulation, Disease management/prevention, Firefighter, Warden/ranger  OT Interventions Warden/ranger, Disease mangement/prevention, Functional electrical stimulation, Neuromuscular re-education, Patient/family education, Self Care/advanced ADL retraining, Splinting/orthotics, Therapeutic Exercise, UE/LE Coordination activities, UE/LE Strength taining/ROM, Therapeutic Activities, Functional mobility training, Discharge planning, DME/adaptive equipment instruction, Cognitive remediation/compensation  SLP Interventions Cognitive remediation/compensation, Cueing hierarchy, Internal/external aids, Patient/family education, Therapeutic Activities  TR Interventions    SW/CM Interventions Discharge Planning, Psychosocial Support, Patient/Family Education, Disease Management/Prevention   Barriers to Discharge MD  Medical stability  Nursing Decreased caregiver support 1 level ramped entry w spouse  PT Decreased caregiver support, Home environment access/layout, Lack of/limited family support    OT      SLP      SW       Team Discharge Planning: Destination: PT-Home ,OT- Home , SLP-Home Projected Follow-up: PT-Home health PT, 24 hour supervision/assistance, Outpatient PT, OT-  Home health OT, Outpatient OT, SLP- (TBD) Projected Equipment Needs: PT-To be determined, OT- To be determined, SLP-None recommended by SLP Equipment Details: PT- , OT-  Patient/family involved in discharge planning: PT- Patient,  OT-Patient, SLP-Patient  MD ELOS: 10-12 days Medical Rehab Prognosis:   Excellent Assessment: The patient has been admitted for CIR therapies with the diagnosis of right corona radiata infarct. The team will be addressing functional mobility, strength, stamina, balance, safety, adaptive techniques and equipment, self-care, bowel and bladder mgt, patient and caregiver education. Goals have been set  at supervision. Anticipated discharge destination is home.        See Team Conference Notes for weekly updates to the plan of care

## 2024-05-05 NOTE — Progress Notes (Signed)
 Occupational Therapy Session Note  Patient Details  Name: Robert Everett MRN: [id number] Date of Birth: Feb 02, 1934  Today's Date: 05/05/2024 OT Individual Time: 9052-8954 OT Individual Time Calculation (min): 58 min    Short Term Goals: Week 1:  OT Short Term Goal 1 (Week 1): Patient will pull pants down for toileting with increased time and contact guard assist OT Short Term Goal 2 (Week 1): Patient will push left arm nto sleeve of shirt with min cueing OT Short Term Goal 3 (Week 1): Patient will transfer to/from shower with min assist OT Short Term Goal 4 (Week 1): Patient will position left arm while up in bed or in wheelchair with min cueing  Skilled Therapeutic Interventions/Progress Updates:   Patient received seated in wheelchair reporting feeling tired after PT session.  Patient agreeable to shower today.  Walked into bathroom with quad cane (small base) and min assist - to facilitate upright posture.  Patient sat down to remove clothing.  Patient at times asking for help without trying to problem solve through situation, e.g. removing socks.  When given time- patient usually able to solve problem safely.   Patient does not move arm volitionally during bathing and dressing activities.   In sitting worked on isolated movement in LUE.  Patient now with shoulder flexion with elbow extension as needed for forward reach.  Able to isolate forearm pro/supination, wrist flex and extension to neutral, and composite flex/ext of all digits! Patient left up in recliner at end of session with personal items in reach and safety belt in place and engaged.    Therapy Documentation Precautions:  Precautions Precautions: Fall Precaution/Restrictions Comments: hard of hearing Restrictions Weight Bearing Restrictions Per Provider Order: No   Pain: Pain Assessment Pain Scale: 0-10 Pain Score: 0-No pain     Therapy/Group: Individual Therapy  Dixon Luczak M 05/05/2024, 12:27 PM

## 2024-05-05 NOTE — Progress Notes (Signed)
 "                                                        PROGRESS NOTE   Subjective/Complaints: C/o nocturia, discussed starting flomax  after supper and he is agreeable Hgb reviewed and is uptrending slightly VSS  ROS: +left sided weakness, +low back pain, +nocturia   Objective:   No results found.  Recent Labs    05/04/24 0502 05/05/24 0538  WBC 8.7 6.9  HGB 12.8* 12.9*  HCT 38.5* 37.1*  PLT 155 179   Recent Labs    05/03/24 0429 05/04/24 0502  NA 138 137  K 4.5 4.5  CL 104 105  CO2 26 26  GLUCOSE 113* 112*  BUN 21 22  CREATININE 0.90 1.02  CALCIUM  9.2 8.9    Intake/Output Summary (Last 24 hours) at 05/05/2024 1004 Last data filed at 05/05/2024 0846 Gross per 24 hour  Intake 840 ml  Output --  Net 840 ml        Physical Exam: Vital Signs Blood pressure (!) 135/53, pulse 60, temperature 97.7 F (36.5 C), temperature source Oral, resp. rate 19, height 5' 10 (1.778 m), weight 68 kg, SpO2 95%. Gen: no distress, normal appearing HEENT: oral mucosa pink and moist, NCAT Cardio: Bradycardia Chest: normal effort, normal rate of breathing Abd: soft, non-distended Ext: no edema Psych: pleasant, normal affect Skin: intact Neuro: Alert and oriented x3, LUE strength is 2/5 in EF and EE, LLE is 3/5 throughout, sensation is intact. Hard of hearing, stable 1/16   Assessment/Plan: 1. Functional deficits which require 3+ hours per day of interdisciplinary therapy in a comprehensive inpatient rehab setting. Physiatrist is providing close team supervision and 24 hour management of active medical problems listed below. Physiatrist and rehab team continue to assess barriers to discharge/monitor patient progress toward functional and medical goals  Care Tool:  Bathing    Body parts bathed by patient: Left arm, Chest, Abdomen, Front perineal area, Buttocks, Right upper leg, Left upper leg, Face   Body parts bathed by helper: Right arm, Right lower leg, Left lower leg      Bathing assist Assist Level: Moderate Assistance - Patient 50 - 74%     Upper Body Dressing/Undressing Upper body dressing   What is the patient wearing?: Pull over shirt    Upper body assist Assist Level: Moderate Assistance - Patient 50 - 74%    Lower Body Dressing/Undressing Lower body dressing      What is the patient wearing?: Pants     Lower body assist Assist for lower body dressing: Moderate Assistance - Patient 50 - 74%     Toileting Toileting    Toileting assist Assist for toileting: Moderate Assistance - Patient 50 - 74%     Transfers Chair/bed transfer  Transfers assist     Chair/bed transfer assist level: Moderate Assistance - Patient 50 - 74%     Locomotion Ambulation   Ambulation assist      Assist level: Moderate Assistance - Patient 50 - 74% Assistive device: Hand held assist Max distance: 175'   Walk 10 feet activity   Assist     Assist level: Moderate Assistance - Patient - 50 - 74% Assistive device: Hand held assist   Walk 50 feet activity   Assist    Assist level:  Moderate Assistance - Patient - 50 - 74% Assistive device: Hand held assist    Walk 150 feet activity   Assist    Assist level: Moderate Assistance - Patient - 50 - 74% Assistive device: Hand held assist    Walk 10 feet on uneven surface  activity   Assist Walk 10 feet on uneven surfaces activity did not occur: Safety/medical concerns         Wheelchair     Assist Is the patient using a wheelchair?: Yes Type of Wheelchair: Manual    Wheelchair assist level: Minimal Assistance - Patient > 75% Max wheelchair distance: 32'    Wheelchair 50 feet with 2 turns activity    Assist        Assist Level: Moderate Assistance - Patient 50 - 74%   Wheelchair 150 feet activity     Assist      Assist Level: Total Assistance - Patient < 25%   Blood pressure (!) 135/53, pulse 60, temperature 97.7 F (36.5 C), temperature source  Oral, resp. rate 19, height 5' 10 (1.778 m), weight 68 kg, SpO2 95%.  Medical Problem List and Plan: 1. 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  Chronic LLE DVT/ Antithrombotics: -DVT/anticoagulation:  Pharmaceutical: continue Lovenox , stool occult ordered since Hgb is decreasing             -antiplatelet therapy: DAPT X 3 weeks followed by Plavix  alone.   3. Chronic LBP/ Pain Management: Tylenol  prn. Kpad ordered  4. Mood/Behavior/Sleep: LCSW to follow for evaluation and support             --Melatonin prn for insomnia.              -antipsychotic agents: N/A  5. Neuropsych/cognition: This patient is capable of making decisions on his own behalf.  6. Nocturia: flomax  started after supper  7. Constipation: LBM 1/14- asked nursing to offer him prune juice, magnesium  oxide added HS  8. Diastolic hypotension: continue to monitor BP TID, especially with addition of flomax , advised this could further decrease BP  9.  Thrombocytopenia: Was down to 134-->149 and recovering. Recheck in am.  10. Moderate to severe aortic regurgitation: BC X 2 negative so far. S/p TEE  11. Dyslipidemia: continue Liptor @ 40 mg.     LOS: 3 days A FACE TO FACE EVALUATION WAS PERFORMED  Annissa Andreoni P Gurkirat Basher 05/05/2024, 10:04 AM     "

## 2024-05-05 NOTE — Progress Notes (Signed)
 Physical Therapy Session Note  Patient Details  Name: Robert Everett MRN: [id number] Date of Birth: [birth year]  Today's Date: 05/05/2024 PT Individual Time: 9254-9157 + 1300-1411 PT Individual Time Calculation (min): 57 min  + 71 min  Short Term Goals: Week 1:  PT Short Term Goal 1 (Week 1): Pt will complete bed mobility with minA PT Short Term Goal 2 (Week 1): Pt will complete bed<>chair transfers with minA PT Short Term Goal 3 (Week 1): Pt will ambulate 150' with minA and LRAD PT Short Term Goal 4 (Week 1): Pt will improve TUG by at least 8 seconds  Skilled Therapeutic Interventions/Progress Updates:      1st session: Pt lying in bed to start - reports mild R shoulder soreness from participating in dance group yesterday afternoon. Reports that was the most activity his shuolder has done since his CVA. Mobility and repositioning provided for pain support and educated him on delayed onset muscle soreness.   Pt incontinent of urine with soaked brief - assist for brief change and donig paper pants. Pt reports urinary incontinence is new since CVA. Messaged team to place patient on timed toileting protocol, if appropriate.   Patient assist to w/c with minA stand pivot transfer. Improved stepping control and accuracy of foot placement on L during transfer.   Pt transported at w/c level to day room gym. Ace wrapped LUE for shoulder approximation and to improve trunk support in standing.   Sit<>stand to QC with minA with cues for pushing from armrest of w/c. Ambulated 2x165' with CGA/minA and QC. Short stride length with slight L trunk lean but continues to improve day to day. MinA needed for the last ~50' when he becomes fatigued, primarily for balance.   Finished session on seated stepper with Kinetron and resistance set to 50cm/sec. Pt completed at w/c level x8 minutes.   Pt returned to his room and ended session seated in w/c. Pt reporting need to toilet - direct handoff of care to NT.     2nd session: Pt sitting in recliner to start - in agreement to therapy treatment. He has no complaints of pain. Ambulatory transfer within his room with CGA and QC - improved safety awareness and insight into his balance.   Transported at w/c level to main gym. Gait training 125' with CGA and QC - cues for upright posture and improving stride length.   Stair training using 3 steps with 1 hand rail on R - CGA/minA for navigating 2x8 steps with seated rest break b/w efforts. Completes this while forward facing with a step-to pattern, R foot leading ascent and L leading descent.   Pt inquiring on furniture transfers since he typically sit's in his recliner at home. Ambulated with CGA and QC from main gym to ADL apartment ~100' and worked on furniture transfers from midwife as well as the sofa couch. Pt completing transfers with min assist with cues for increasing his forward weight shift and scooting closer to edge of chair.   Returned to main gym and in // bars, worked on [employer] for visual aid: -2x15 knee bends -2x15 static standing marching  Pt returned to his room and requested to return to bed to rest - ambulatory transfer with QC and CGA to bed - supervision for transitioning from sitting to supine. Needs met, alarm on, call bell in lap.     Therapy Documentation Precautions:  Precautions Precautions: Fall Precaution/Restrictions Comments: hard of hearing Restrictions  Weight Bearing Restrictions Per Provider Order: No General:      Therapy/Group: Individual Therapy  [name] 05/05/2024, 7:38 AM

## 2024-05-05 NOTE — Plan of Care (Signed)
  Problem: RH BOWEL ELIMINATION Goal: RH STG MANAGE BOWEL WITH ASSISTANCE Description: STG Manage Bowel with mod I Assistance. Outcome: Progressing Goal: RH STG MANAGE BOWEL W/MEDICATION W/ASSISTANCE Description: STG Manage Bowel with Medication with mod I Assistance. Outcome: Progressing

## 2024-05-05 NOTE — Plan of Care (Signed)
" °  Problem: Consults Goal: RH STROKE PATIENT EDUCATION Description: See Patient Education module for education specifics  Outcome: Progressing   Problem: RH BOWEL ELIMINATION Goal: RH STG MANAGE BOWEL WITH ASSISTANCE Description: STG Manage Bowel with mod I Assistance. Outcome: Progressing Goal: RH STG MANAGE BOWEL W/MEDICATION W/ASSISTANCE Description: STG Manage Bowel with Medication with  mod I Assistance. Outcome: Progressing   Problem: RH KNOWLEDGE DEFICIT Goal: RH STG INCREASE KNOWLEDGE OF HYPERTENSION Description: Patient and spouse will be able to manage HTN using educational resources for medications, dietary modification independently Outcome: Progressing Goal: RH STG INCREASE KNOWLEGDE OF HYPERLIPIDEMIA Description: Patient and spouse will be able to manage HLD using educational resources for medications, dietary modification independently Outcome: Progressing Goal: RH STG INCREASE KNOWLEDGE OF STROKE PROPHYLAXIS Description: Patient and spouse will be able to manage secondary risks using educational resources for medications, dietary modification independently Outcome: Progressing   "

## 2024-05-06 DIAGNOSIS — I639 Cerebral infarction, unspecified: Secondary | ICD-10-CM | POA: Diagnosis not present

## 2024-05-06 MED ORDER — DICLOFENAC SODIUM 1 % EX GEL
2.0000 g | Freq: Four times a day (QID) | CUTANEOUS | Status: DC
  Administered 2024-05-06 – 2024-05-10 (×11): 2 g via TOPICAL
  Filled 2024-05-06 (×2): qty 100

## 2024-05-06 NOTE — Progress Notes (Signed)
 Occupational Therapy Session Note  Patient Details  Name: Robert Everett MRN: [id number] Date of Birth: [birth year]  Today's Date: 05/06/2024 OT Individual Time: 9184-9073 OT Individual Time Calculation (min): 71 min    Short Term Goals: Week 1:  OT Short Term Goal 1 (Week 1): Patient will pull pants down for toileting with increased time and contact guard assist OT Short Term Goal 2 (Week 1): Patient will push left arm nto sleeve of shirt with min cueing OT Short Term Goal 3 (Week 1): Patient will transfer to/from shower with min assist OT Short Term Goal 4 (Week 1): Patient will position left arm while up in bed or in wheelchair with min cueing  Skilled Therapeutic Interventions/Progress Updates:    Pt resting in bed upon arrival and agreeable to therapy. Pt requested to use toilet. Supine>sit EOB with supervision. Pt amb with QC to bathroom with CGA. Sit<>stand from std toilet with min A. Mod A for toileting tasks. Pt remained standing with supervision to facilitate OTA fastening incontinence brief. Rest break X 1. Pt requires encouragement to incorporate LUE in functional tasks. Pt returned to room and sat in w/c at sink to wash hands. Pt transitioned to gym. Skilled OT intervention with focus on functional amb with QC, standing balance, sitting balance, LUE function, and safety awareness to increase independence with BADLs. Pt noted with posterior pelvic tilt but able to correct with verbal cues. Min verbal cues for upright posture when amb with QC. LUE functional reachiing tasks at table with emphasis on scapular protraction/retraction, elbow flexion/extension, abduction/adduction. Pt noted with improved function with repetition. Pt noted with active pronation/supination. Pt returned to room and amb with QC to recliner. Pt remained in recliner with all needs within reach. Belt alarm activated.   Therapy Documentation Precautions:  Precautions Precautions: Fall Precaution/Restrictions  Comments: hard of hearing Restrictions Weight Bearing Restrictions Per Provider Order: No   Pain: Pt c/o back pain when seated in sted chair; pillow provided with relief noted    Therapy/Group: Individual Therapy  Maritza Debby Mare 05/06/2024, 9:33 AM

## 2024-05-06 NOTE — Plan of Care (Signed)
" °  Problem: Consults Goal: RH STROKE PATIENT EDUCATION Description: See Patient Education module for education specifics  Outcome: Progressing   Problem: RH BOWEL ELIMINATION Goal: RH STG MANAGE BOWEL WITH ASSISTANCE Description: STG Manage Bowel with mod I Assistance. Outcome: Progressing Goal: RH STG MANAGE BOWEL W/MEDICATION W/ASSISTANCE Description: STG Manage Bowel with Medication with  mod I Assistance. Outcome: Progressing   Problem: RH KNOWLEDGE DEFICIT Goal: RH STG INCREASE KNOWLEDGE OF HYPERTENSION Description: Patient and spouse will be able to manage HTN using educational resources for medications, dietary modification independently Outcome: Progressing Goal: RH STG INCREASE KNOWLEGDE OF HYPERLIPIDEMIA Description: Patient and spouse will be able to manage HLD using educational resources for medications, dietary modification independently Outcome: Progressing Goal: RH STG INCREASE KNOWLEDGE OF STROKE PROPHYLAXIS Description: Patient and spouse will be able to manage secondary risks using educational resources for medications, dietary modification independently Outcome: Progressing   Problem: RH BOWEL ELIMINATION Goal: RH STG MANAGE BOWEL WITH ASSISTANCE Description: STG Manage Bowel with Assistance. Outcome: Progressing Goal: RH STG MANAGE BOWEL W/MEDICATION W/ASSISTANCE Description: STG Manage Bowel with Medication with Assistance. Outcome: Progressing   "

## 2024-05-06 NOTE — Progress Notes (Signed)
 Speech Language Pathology Daily Session Note  Patient Details  Name: Robert Everett MRN: [id number] Date of Birth: 1933-08-14  Today's Date: 05/06/2024 SLP Individual Time: 1400-1500 SLP Individual Time Calculation (min): 60 min  Short Term Goals: Week 1: SLP Short Term Goal 1 (Week 1): Patient will recall cognitive and physical changes with min verbal A SLP Short Term Goal 2 (Week 1): Patient will demonstrate problem solving skills in functional situations given min mulitmodal A  Skilled Therapeutic Interventions:   Skilled intervention focused on cognition. He was able to recall exercises given by OT to complete outside of therapy. He was aware of purpose of brace for wrist and was able to explain purpose to ST. Pt stated he knew his medications and did not need to review these in session. However was only familiar with one medication during session when given medication list and did not know times medications are taken. Verbal problem solving task completed with min-mod A for solving time calculations. Cont to address insight into cognitive and physical limitations and aids to increase independence with tasks requiring memory/ recall skills once home.   Pain Pain Assessment Pain Scale: Faces Faces Pain Scale: No hurt  Therapy/Group: Individual Therapy  Recardo LABOR Inez Rosato 05/06/2024, 2:50 PM

## 2024-05-06 NOTE — Progress Notes (Signed)
 Physical Therapy Session Note  Patient Details  Name: SAGAR TENGAN MRN: [id number] Date of Birth: May 30, 1933  Today's Date: 05/06/2024 PT Individual Time: 1107-1202 PT Individual Time Calculation (min): 55 min   Short Term Goals: Week 1:  PT Short Term Goal 1 (Week 1): Pt will complete bed mobility with minA PT Short Term Goal 2 (Week 1): Pt will complete bed<>chair transfers with minA PT Short Term Goal 3 (Week 1): Pt will ambulate 150' with minA and LRAD PT Short Term Goal 4 (Week 1): Pt will improve TUG by at least 8 seconds  Skilled Therapeutic Interventions/Progress Updates: Patient sitting in recliner on entrance to room. Patient alert and agreeable to PT session.   Patient reported unrated pain in R upper trap (crick) that he stated may have come from dance group from previous day. Pt stated topical medicine has assisted. Manual therapy also provided with pt reporting decrease in pain with hot pack following intervention donned on B upper traps musculature   Therapeutic Activity: Transfers: Pt performed sit<>stand transfers throughout session with CGA and use of small QC and cues to increase step clearance/length.   Therapeutic Exercise: Pt performed the following exercises with therapist providing the described cuing and facilitation for improvement. - B levator scapula stretch and lateral cervical flexor stretch 2 x 20 second hold with cues for mechanics.  - L hip flexion seated and LAQ with 5lb ankle weight donned and pt demonstrating ability to only perform few degrees of hip flexion and less than full knee extension. Pt performed 2 rounds close to fatigue then performed same exercise without ankle weight and improved ROM.  Manual Therapy: Palpation of B upper trap musculature performed with trigger points noted. Education and rationale provided with pt agreeing to participate in intervention. - Trigger point release to stated with soft tissue mobilization to follow  throughout. Pt appreciative of intervention and also demonstrated increase cervical rotation (not formally assessed)  Patient sitting in recliner at end of session with brakes locked, belt alarm set, and all needs within reach.     Therapy Documentation Precautions:  Precautions Precautions: Fall Precaution/Restrictions Comments: hard of hearing Restrictions Weight Bearing Restrictions Per Provider Order: No  Therapy/Group: Individual Therapy  Arelly Whittenberg PTA 05/06/2024, 12:51 PM

## 2024-05-06 NOTE — Plan of Care (Signed)
" °  Problem: RH BOWEL ELIMINATION Goal: RH STG MANAGE BOWEL WITH ASSISTANCE Description: STG Manage Bowel with mod I Assistance. Outcome: Progressing Goal: RH STG MANAGE BOWEL W/MEDICATION W/ASSISTANCE Description: STG Manage Bowel with Medication with  mod I Assistance. Outcome: Progressing   Problem: RH KNOWLEDGE DEFICIT Goal: RH STG INCREASE KNOWLEDGE OF STROKE PROPHYLAXIS Description: Patient and spouse will be able to manage secondary risks using educational resources for medications, dietary modification independently Outcome: Progressing   "

## 2024-05-06 NOTE — Progress Notes (Signed)
 "                                                        PROGRESS NOTE   Subjective/Complaints:  No events overnight.  Patient complaining of pain is in his bilateral neck and shoulders today, worked significantly with PT on massage in the right shoulder, which helped.  He endorses a recent history of back surgery 2 months ago, with ongoing pain there as well.  Otherwise, no complaints.  Vitals are stable.  ROS: +left sided weakness, +low back and neck pain, +nocturia   Objective:   No results found.  Recent Labs    05/04/24 0502 05/05/24 0538  WBC 8.7 6.9  HGB 12.8* 12.9*  HCT 38.5* 37.1*  PLT 155 179   Recent Labs    05/04/24 0502  NA 137  K 4.5  CL 105  CO2 26  GLUCOSE 112*  BUN 22  CREATININE 1.02  CALCIUM  8.9    Intake/Output Summary (Last 24 hours) at 05/06/2024 1604 Last data filed at 05/06/2024 1321 Gross per 24 hour  Intake 840 ml  Output --  Net 840 ml        Physical Exam: Vital Signs Blood pressure (!) 153/60, pulse 83, temperature 97.7 F (36.5 C), temperature source Oral, resp. rate 16, height 5' 10 (1.778 m), weight 68 kg, SpO2 96%. Gen: no distress, normal appearing.  Sitting upright in bedside chair. HEENT: oral mucosa pink and moist, NCAT Cardio: Bradycardia Chest: normal effort, normal rate of breathing Abd: soft, non-distended Ext: no edema Psych: pleasant, normal affect Skin: intact Neuro: Alert and oriented x3, LUE strength is 2/5 in EF and EE, LLE is 3/5 throughout, sensation is intact. Hard of hearing--unchanged 1-17  MSK: Mild tenderness to palpation throughout bilateral cervical paraspinals, levator scapula, splenius capitis, and trapezius muscles.  Also, lumbar paraspinals.  A few palpable trigger points in the trapezius muscles bilaterally.  Active range of motion in neck rotation and extension limited.  Assessment/Plan: 1. Functional deficits which require 3+ hours per day of interdisciplinary therapy in a comprehensive  inpatient rehab setting. Physiatrist is providing close team supervision and 24 hour management of active medical problems listed below. Physiatrist and rehab team continue to assess barriers to discharge/monitor patient progress toward functional and medical goals  Care Tool:  Bathing    Body parts bathed by patient: Left arm, Chest, Abdomen, Front perineal area, Buttocks, Right upper leg, Left upper leg, Face, Right lower leg   Body parts bathed by helper: Right arm, Left lower leg     Bathing assist Assist Level: Minimal Assistance - Patient > 75%     Upper Body Dressing/Undressing Upper body dressing   What is the patient wearing?: Pull over shirt    Upper body assist Assist Level: Moderate Assistance - Patient 50 - 74%    Lower Body Dressing/Undressing Lower body dressing      What is the patient wearing?: Pants, Incontinence brief     Lower body assist Assist for lower body dressing: Moderate Assistance - Patient 50 - 74%     Toileting Toileting Toileting Activity did not occur (Clothing management and hygiene only): N/A (no void or bm)  Toileting assist Assist for toileting: Moderate Assistance - Patient 50 - 74%     Transfers Chair/bed transfer  Transfers  assist     Chair/bed transfer assist level: Minimal Assistance - Patient > 75%     Locomotion Ambulation   Ambulation assist      Assist level: Moderate Assistance - Patient 50 - 74% Assistive device: Hand held assist Max distance: 175'   Walk 10 feet activity   Assist     Assist level: Moderate Assistance - Patient - 50 - 74% Assistive device: Hand held assist   Walk 50 feet activity   Assist    Assist level: Moderate Assistance - Patient - 50 - 74% Assistive device: Hand held assist    Walk 150 feet activity   Assist    Assist level: Moderate Assistance - Patient - 50 - 74% Assistive device: Hand held assist    Walk 10 feet on uneven surface  activity   Assist Walk  10 feet on uneven surfaces activity did not occur: Safety/medical concerns         Wheelchair     Assist Is the patient using a wheelchair?: Yes Type of Wheelchair: Manual    Wheelchair assist level: Minimal Assistance - Patient > 75% Max wheelchair distance: 96'    Wheelchair 50 feet with 2 turns activity    Assist        Assist Level: Moderate Assistance - Patient 50 - 74%   Wheelchair 150 feet activity     Assist      Assist Level: Total Assistance - Patient < 25%   Blood pressure (!) 153/60, pulse 83, temperature 97.7 F (36.5 C), temperature source Oral, resp. rate 16, height 5' 10 (1.778 m), weight 68 kg, SpO2 96%.  Medical Problem List and Plan: 1. 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  Chronic LLE DVT/ Antithrombotics: -DVT/anticoagulation:  Pharmaceutical: continue Lovenox , stool occult ordered since Hgb is decreasing             -antiplatelet therapy: DAPT X 3 weeks followed by Plavix  alone.   3. Chronic LBP/ Pain Management: Tylenol  prn. Kpad ordered  - 1-17: Voltaren  gel 4 times daily to shoulders and back.  Has as needed muscle rub.  If no improvement, we will consider trigger point injections.  4. Mood/Behavior/Sleep: LCSW to follow for evaluation and support             --Melatonin prn for insomnia.              -antipsychotic agents: N/A  5. Neuropsych/cognition: This patient is capable of making decisions on his own behalf.  6. Nocturia: flomax  started after supper  7. Constipation: LBM 1/14- asked nursing to offer him prune juice, magnesium  oxide added HS  8. Diastolic hypotension: continue to monitor BP TID, especially with addition of flomax , advised this could further decrease BP  - BP well-controlled 1-17 9.  Thrombocytopenia: Was down to 134-->149 and recovering. Recheck in am.   10. Moderate to severe aortic regurgitation: BC X 2 negative so far. S/p TEE  11. Dyslipidemia: continue Liptor @ 40 mg.     LOS: 4 days A FACE TO FACE EVALUATION WAS PERFORMED  Joesph JAYSON Likes 05/06/2024, 4:04 PM     "

## 2024-05-07 DIAGNOSIS — I639 Cerebral infarction, unspecified: Secondary | ICD-10-CM | POA: Diagnosis not present

## 2024-05-07 MED ORDER — POLYETHYLENE GLYCOL 3350 17 G PO PACK
17.0000 g | PACK | Freq: Every day | ORAL | Status: DC | PRN
  Administered 2024-05-07 – 2024-05-15 (×2): 17 g via ORAL
  Filled 2024-05-07 (×2): qty 1

## 2024-05-07 NOTE — Progress Notes (Signed)
 This I was ambitious a lot yeah I looked good put this back I will put it back in rather 8 more choose a 90 then put it back                                                        PROGRESS NOTE   Subjective/Complaints:  No events overnight.  Vitals are stable. Family at bedside today, asking about management of hole in his heart.  On chart review, echocardiogram positive for intra-atrial shunt, relayed this to the family along with current conservative management.  Patient continues to feel better since massage yesterday with his neck and shoulders.  Notes improve range of motion and left elbow today.  Last bowel movement 1-14.  Incontinent of urine.  Denies dysuria.  ROS: Per HPI above.  +left sided weakness, +low back and neck pain, +nocturia   Objective:   No results found.  Recent Labs    05/05/24 0538  WBC 6.9  HGB 12.9*  HCT 37.1*  PLT 179   No results for input(s): NA, K, CL, CO2, GLUCOSE, BUN, CREATININE, CALCIUM  in the last 72 hours.   Intake/Output Summary (Last 24 hours) at 05/07/2024 2124 Last data filed at 05/07/2024 1815 Gross per 24 hour  Intake 1000 ml  Output 250 ml  Net 750 ml        Physical Exam: Vital Signs Blood pressure (!) 132/45, pulse 62, temperature (!) 97.5 F (36.4 C), temperature source Oral, resp. rate 17, height 5' 10 (1.778 m), weight 68 kg, SpO2 96%. Gen: no distress, normal appearing.  Sitting upright in bed. HEENT: oral mucosa pink and moist, NCAT Cardio: Bradycardia Chest: normal effort, normal rate of breathing Abd: soft, non-distended Ext: no edema Psych: pleasant, normal affect Skin: intact Neuro: Alert and oriented x3,  LUE strength is 2/5 in EF and EE,  3/5 FF, FA LLE is 3/5 throughout,  sensation is intact.  Hard of hearing   MSK: Mild tenderness to palpation throughout bilateral cervical paraspinals, levator scapula, splenius capitis, and trapezius muscles.  Also, lumbar paraspinals  unchanged.    Assessment/Plan: 1. Functional deficits which require 3+ hours per day of interdisciplinary therapy in a comprehensive inpatient rehab setting. Physiatrist is providing close team supervision and 24 hour management of active medical problems listed below. Physiatrist and rehab team continue to assess barriers to discharge/monitor patient progress toward functional and medical goals  Care Tool:  Bathing    Body parts bathed by patient: Left arm, Chest, Abdomen, Front perineal area, Buttocks, Right upper leg, Left upper leg, Face, Right lower leg   Body parts bathed by helper: Right arm, Left lower leg     Bathing assist Assist Level: Minimal Assistance - Patient > 75%     Upper Body Dressing/Undressing Upper body dressing   What is the patient wearing?: Pull over shirt    Upper body assist Assist Level: Moderate Assistance - Patient 50 - 74%    Lower Body Dressing/Undressing Lower body dressing      What is the patient wearing?: Pants, Incontinence brief     Lower body assist Assist for lower body dressing: Moderate Assistance - Patient 50 - 74%     Toileting Toileting Toileting Activity did not occur (Clothing management and hygiene only): N/A (no void or bm)  Toileting  assist Assist for toileting: Moderate Assistance - Patient 50 - 74%     Transfers Chair/bed transfer  Transfers assist     Chair/bed transfer assist level: Minimal Assistance - Patient > 75%     Locomotion Ambulation   Ambulation assist      Assist level: Moderate Assistance - Patient 50 - 74% Assistive device: Hand held assist Max distance: 175'   Walk 10 feet activity   Assist     Assist level: Moderate Assistance - Patient - 50 - 74% Assistive device: Hand held assist   Walk 50 feet activity   Assist    Assist level: Moderate Assistance - Patient - 50 - 74% Assistive device: Hand held assist    Walk 150 feet activity   Assist    Assist level:  Moderate Assistance - Patient - 50 - 74% Assistive device: Hand held assist    Walk 10 feet on uneven surface  activity   Assist Walk 10 feet on uneven surfaces activity did not occur: Safety/medical concerns         Wheelchair     Assist Is the patient using a wheelchair?: Yes Type of Wheelchair: Manual    Wheelchair assist level: Minimal Assistance - Patient > 75% Max wheelchair distance: 42'    Wheelchair 50 feet with 2 turns activity    Assist        Assist Level: Moderate Assistance - Patient 50 - 74%   Wheelchair 150 feet activity     Assist      Assist Level: Total Assistance - Patient < 25%   Blood pressure (!) 132/45, pulse 62, temperature (!) 97.5 F (36.4 C), temperature source Oral, resp. rate 17, height 5' 10 (1.778 m), weight 68 kg, SpO2 96%.  Medical Problem List and Plan: 1. 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  Chronic LLE DVT/ Antithrombotics: -DVT/anticoagulation:  Pharmaceutical: continue Lovenox , stool occult ordered since Hgb is decreasing             -antiplatelet therapy: DAPT X 3 weeks followed by Plavix  alone.   3. Chronic LBP/ Pain Management: Tylenol  prn. Kpad ordered  - 1-17: Voltaren  gel 4 times daily to shoulders and back.  Has as needed muscle rub.  If no improvement, we will consider trigger point injections.  1-18: Pain improved, monitor  4. Mood/Behavior/Sleep: LCSW to follow for evaluation and support             --Melatonin prn for insomnia.              -antipsychotic agents: N/A  5. Neuropsych/cognition: This patient is capable of making decisions on his own behalf.  6. Nocturia/urinary incontinence: flomax  started after supper  1-18: Ongoing; start timed toileting, PVRs  7. Constipation: LBM 1/14- asked nursing to offer him prune juice, magnesium  oxide  added HS  - 1-18: Last BM 1-14, as needed MiraLAX  ordered.  8. Diastolic hypotension: continue to monitor BP TID, especially with addition of flomax , advised this could further decrease BP  - BP well-controlled 1-17, 1/18  9.  Thrombocytopenia: Was down to 134-->149 and recovering. Recheck in am.   10. Moderate to severe aortic regurgitation: BC X 2 negative so far. S/p TEE  - 1-18: Reviewed with  patient and family management of PFO, outpatient follow-up  11. Dyslipidemia: continue Liptor @ 40 mg.     LOS: 5 days A FACE TO FACE EVALUATION WAS PERFORMED  Joesph JAYSON Likes 05/07/2024, 9:24 PM

## 2024-05-07 NOTE — Plan of Care (Signed)
  Problem: RH BOWEL ELIMINATION Goal: RH STG MANAGE BOWEL WITH ASSISTANCE Description: STG Manage Bowel with mod I Assistance. Outcome: Progressing Goal: RH STG MANAGE BOWEL W/MEDICATION W/ASSISTANCE Description: STG Manage Bowel with Medication with mod I Assistance. Outcome: Progressing

## 2024-05-08 DIAGNOSIS — I639 Cerebral infarction, unspecified: Secondary | ICD-10-CM | POA: Diagnosis not present

## 2024-05-08 LAB — BASIC METABOLIC PANEL WITH GFR
Anion gap: 8 (ref 5–15)
BUN: 28 mg/dL — ABNORMAL HIGH (ref 8–23)
CO2: 26 mmol/L (ref 22–32)
Calcium: 8.9 mg/dL (ref 8.9–10.3)
Chloride: 102 mmol/L (ref 98–111)
Creatinine, Ser: 1.06 mg/dL (ref 0.61–1.24)
GFR, Estimated: >60 mL/min
Glucose, Bld: 103 mg/dL — ABNORMAL HIGH (ref 70–99)
Potassium: 4.5 mmol/L (ref 3.5–5.1)
Sodium: 136 mmol/L (ref 135–145)

## 2024-05-08 LAB — CBC
HCT: 36.1 % — ABNORMAL LOW (ref 39.0–52.0)
Hemoglobin: 12.5 g/dL — ABNORMAL LOW (ref 13.0–17.0)
MCH: 33.4 pg (ref 26.0–34.0)
MCHC: 34.6 g/dL (ref 30.0–36.0)
MCV: 96.5 fL (ref 80.0–100.0)
Platelets: 200 K/uL (ref 150–400)
RBC: 3.74 MIL/uL — ABNORMAL LOW (ref 4.22–5.81)
RDW: 13.2 % (ref 11.5–15.5)
WBC: 5.7 K/uL (ref 4.0–10.5)
nRBC: 0 % (ref 0.0–0.2)

## 2024-05-08 NOTE — Progress Notes (Signed)
 Speech Language Pathology Daily Session Note  Patient Details  Name: Robert Everett MRN: [id number] Date of Birth: Aug 09, 1933  Today's Date: 05/08/2024 SLP Individual Time: 1250-1348 SLP Individual Time Calculation (min): 58 min  Short Term Goals: Week 1: SLP Short Term Goal 1 (Week 1): Patient will recall cognitive and physical changes with min verbal A SLP Short Term Goal 2 (Week 1): Patient will demonstrate problem solving skills in functional situations given min mulitmodal A  Skilled Therapeutic Interventions: Skilled therapy session focused on cognitive goals. SLP facilitated session by prompting patient to share activities completed in prior therapy sessions. Patient recalled PT/OT, however unable to recall activities completed in prior ST sessions. SLP initiated use of memory book. SLP targeted problem solving through medication management task. Patient completed BID pillbox according to written and verbalized directions with supervisionA and no mistakes. To continue to challenge patient, SLP prompted him to locate mistakes and correct. Patient completed with min fading to supervisionA. Patient left in chair with alarm set and call bell in reach. Continue POC  Pain None reported   Therapy/Group: Individual Therapy  Annisha Baar M.A., CCC-SLP 05/08/2024, 7:44 AM

## 2024-05-08 NOTE — Progress Notes (Signed)
 Patient ID: Robert Everett, male   DOB: Feb 18, 1934, 89 y.o.   MRN: [id number] Met with the patient to review current medical situation, rehab process, team conference and plan of care. Discussed secondary risk management including HTN, HLD (LDL 72/Trig 49) on Lipitor with DAPT x 3 weeks then Plavix  solo. Reviewed constipation and dysuria/nocturia PTA with urinary incontinence post CVA now on Flomax  with good results noted. LBM today 05/08/24. Continue to follow along to address educational needs to facilitate preparation for discharge. [name]

## 2024-05-08 NOTE — Progress Notes (Signed)
 Occupational Therapy Session Note  Patient Details  Name: Robert Everett MRN: [id number] Date of Birth: April 22, 1933  Today's Date: 05/08/2024 OT Individual Time: 1410-1456 session 2 OT Individual Time Calculation (min): 46 min   OT Individual Time: 1132-1200 session 1 OT Individual Time Calculation (min): 28 min   Short Term Goals: Week 1:  OT Short Term Goal 1 (Week 1): Patient will pull pants down for toileting with increased time and contact guard assist OT Short Term Goal 2 (Week 1): Patient will push left arm nto sleeve of shirt with min cueing OT Short Term Goal 3 (Week 1): Patient will transfer to/from shower with min assist OT Short Term Goal 4 (Week 1): Patient will position left arm while up in bed or in wheelchair with min cueing  Skilled Therapeutic Interventions/Progress Updates:  Session 1: Pt greeted seated in recliner, pt agreeable to OT intervention but requested to stray in room d/t fatigue.    No pain reported during session    NMR:  Pt completed seated Grasping  task with pt instructed to grasp wash cloth with LUE and take it over obstacle to challenge functional grasp and targeted reaching. Pt completed task with increased time and effort but overall supervision. Pt with improved grasp and motor control in LUE  Pt completed seated FMC task with pt instructed to remove Velcro looped checkers from board with a focus on composite digit flexion. Pt completed task with increased time and effort but supervision assist, min cues for technique.   Pt completed seated reaching task with pt instructed to grasp horseshoes and remove  from side table with a focus on cylindircal grasp and active reaching. Pt needed horseshoe to stabilized and L elbow supported but able to complete task with supevision.                  Ended session with pt seated in recliner with all needs within reach and safety belt alarm activated.                         Session 2:Pt greeted seated  in recliner, pt agreeable to OT intervention.    No pain reported during session .  Transfers/bed mobility/functional mobility: pt completed functional ambulation in room while holding QC with CGA, education provided on either using QC on floor or walking without it.   Practiced functional ambulation with no AD with pt able to hold cone in L hand and walk ~ 12ft with CGA however pt became fatigued from maintaining concentric control during dual processing task needing to sit down prematurely.   Therapeutic activity:  Pt completed Standing reaching task with pt instructed to grasp bean bags placed in hand and transport bags to basketball goal positioned in front of pt to facilitate improved active reaching in affected UE. Pt completed task with CGA, pt only missed 2/20 bags.    NMR: pt completed below therex with 1 lb weighted ball to promote NMRE to LUE and to improve bimanual coordination for ADL participation:   Elbow flexion/extension Wrist pronation/supination  Chest presses                  Ended session with pt supine in bed with all needs within reach and bed alarm activated.                    Therapy Documentation Precautions:  Precautions Precautions: Fall Precaution/Restrictions Comments: hard of hearing Restrictions Weight Bearing  Restrictions Per Provider Order: No    Therapy/Group: Individual Therapy  Ronal Gift [facility] 05/08/2024, 3:09 PM

## 2024-05-08 NOTE — Progress Notes (Signed)
 Physical Therapy Session Note  Patient Details  Name: Robert Everett MRN: [id number] Date of Birth: February 17, 1934  Today's Date: 05/08/2024 PT Individual Time: 9154-9045 PT Individual Time Calculation (min): 69 min   Short Term Goals: Week 1:  PT Short Term Goal 1 (Week 1): Pt will complete bed mobility with minA PT Short Term Goal 2 (Week 1): Pt will complete bed<>chair transfers with minA PT Short Term Goal 3 (Week 1): Pt will ambulate 150' with minA and LRAD PT Short Term Goal 4 (Week 1): Pt will improve TUG by at least 8 seconds  Skilled Therapeutic Interventions/Progress Updates:      Pt presents in bed to start - has no c/o pain. Provided patient his hearing aids which he donned without assist.   Supine<>sitting EOB with supervision with hospital bed features. Sit<>stand and stand pivot transfer with CGA using QC in his R hand - had x1 minor LOB while turning to sit that needed minA for balance recovery.   Pt transported at w/c level to day room gym for energy conservation. Sit<>stand from w/c to QC with CGA - gait training 165' with CGA and QC - initially demonstrates step-to gait pattern with small step length bilaterally. Once he becomes more confident, he improves to a reciprocal stepping pattern but gait speed remains slow. Had no LOB for the entirety of the 165' distance!\  Added obstacle training with cone weaving and backward stepping while using the QC. CGA for balance while completing dynamic gait.   Pt completed Timed up and Go testing with the QC: 1) 45 seconds 2) 41 seconds 3) 37 seconds AVG = 41 seconds *scores > 13.5 seconds indicate increased falls risk *Pt completed in 53 seconds with HHA on 05/03/24  Pt finished session on seated stepper (kinetron) where he required minA for safely transferring onto. Pt completed 3x2 minutes at L60 cm/sec resistance. Cues for full ROM on his L to equal his R, sustained a sufficient spm to challenge him.   Pt returned to his  room where he was left sitting up in the wheelchair, call bell in reach and all needs met.   Therapy Documentation Precautions:  Precautions Precautions: Fall Precaution/Restrictions Comments: hard of hearing Restrictions Weight Bearing Restrictions Per Provider Order: No General:      Therapy/Group: Individual Therapy  [name] 05/08/2024, 7:33 AM

## 2024-05-08 NOTE — Progress Notes (Signed)
 "                                                        PROGRESS NOTE   Subjective/Complaints: No new complaints this morning Kpad was not working, will ask nursing to please replace Still urinating frequently despite flomax   ROS: Per HPI above.  +left sided weakness, +low back and neck pain, +nocturia, moving bowels regularly   Objective:   No results found.  Recent Labs    05/08/24 0434  WBC 5.7  HGB 12.5*  HCT 36.1*  PLT 200   Recent Labs    05/08/24 0434  NA 136  K 4.5  CL 102  CO2 26  GLUCOSE 103*  BUN 28*  CREATININE 1.06  CALCIUM  8.9     Intake/Output Summary (Last 24 hours) at 05/08/2024 1253 Last data filed at 05/08/2024 0736 Gross per 24 hour  Intake 1240 ml  Output --  Net 1240 ml        Physical Exam: Vital Signs Blood pressure (!) 148/56, pulse 89, temperature 97.7 F (36.5 C), resp. rate 18, height 5' 10 (1.778 m), weight 68 kg, SpO2 95%. Gen: no distress, normal appearing.  Sitting upright in bed. HEENT: oral mucosa pink and moist, NCAT Cardio: Bradycardia Chest: normal effort, normal rate of breathing Abd: soft, non-distended Ext: no edema Psych: pleasant, normal affect Skin: intact Neuro: Alert and oriented x3,  LUE strength is 3/5 in EF and EE,  3/5 FF, FA LLE is 3/5 throughout,  sensation is intact.  Hard of hearing   MSK: Mild tenderness to palpation throughout bilateral cervical paraspinals, levator scapula, splenius capitis, and trapezius muscles.  Also, lumbar paraspinals unchanged.    Assessment/Plan: 1. Functional deficits which require 3+ hours per day of interdisciplinary therapy in a comprehensive inpatient rehab setting. Physiatrist is providing close team supervision and 24 hour management of active medical problems listed below. Physiatrist and rehab team continue to assess barriers to discharge/monitor patient progress toward functional and medical goals  Care Tool:  Bathing    Body parts bathed by patient:  Left arm, Chest, Abdomen, Front perineal area, Buttocks, Right upper leg, Left upper leg, Face, Right lower leg   Body parts bathed by helper: Right arm, Left lower leg     Bathing assist Assist Level: Minimal Assistance - Patient > 75%     Upper Body Dressing/Undressing Upper body dressing   What is the patient wearing?: Pull over shirt    Upper body assist Assist Level: Moderate Assistance - Patient 50 - 74%    Lower Body Dressing/Undressing Lower body dressing      What is the patient wearing?: Pants, Incontinence brief     Lower body assist Assist for lower body dressing: Moderate Assistance - Patient 50 - 74%     Toileting Toileting Toileting Activity did not occur (Clothing management and hygiene only): N/A (no void or bm)  Toileting assist Assist for toileting: Moderate Assistance - Patient 50 - 74%     Transfers Chair/bed transfer  Transfers assist     Chair/bed transfer assist level: Minimal Assistance - Patient > 75%     Locomotion Ambulation   Ambulation assist      Assist level: Moderate Assistance - Patient 50 - 74% Assistive device: Hand held assist Max distance: 34'  Walk 10 feet activity   Assist     Assist level: Moderate Assistance - Patient - 50 - 74% Assistive device: Hand held assist   Walk 50 feet activity   Assist    Assist level: Moderate Assistance - Patient - 50 - 74% Assistive device: Hand held assist    Walk 150 feet activity   Assist    Assist level: Moderate Assistance - Patient - 50 - 74% Assistive device: Hand held assist    Walk 10 feet on uneven surface  activity   Assist Walk 10 feet on uneven surfaces activity did not occur: Safety/medical concerns         Wheelchair     Assist Is the patient using a wheelchair?: Yes Type of Wheelchair: Manual    Wheelchair assist level: Minimal Assistance - Patient > 75% Max wheelchair distance: 18'    Wheelchair 50 feet with 2 turns  activity    Assist        Assist Level: Moderate Assistance - Patient 50 - 74%   Wheelchair 150 feet activity     Assist      Assist Level: Total Assistance - Patient < 25%   Blood pressure (!) 148/56, pulse 89, temperature 97.7 F (36.5 C), resp. rate 18, height 5' 10 (1.778 m), weight 68 kg, SpO2 95%.  Medical Problem List and Plan: 1. 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  Chronic LLE DVT/ Antithrombotics: -DVT/anticoagulation:  Pharmaceutical: continue Lovenox , stool occult ordered since Hgb is decreasing             -antiplatelet therapy: DAPT X 3 weeks followed by Plavix  alone.   3. Chronic LBP/ Pain Management: Tylenol  prn. Kpad ordered- asked nursing to please get him new one as current is not working  - 1-17: Voltaren  gel 4 times daily to shoulders and back.  Has as needed muscle rub.  If no improvement, we will consider trigger point injections.  4. Mood/Behavior/Sleep: LCSW to follow for evaluation and support             --Melatonin prn for insomnia.              -antipsychotic agents: N/A  5. Neuropsych/cognition: This patient is capable of making decisions on his own behalf.  6. Nocturia/urinary incontinence: flomax  started after supper  1-18: Ongoing; start timed toileting, PVRs  7. Constipation: LBM 1/19- asked nursing to offer him prune juice, magnesium  oxide added HS  8. Diastolic hypotension: continue to monitor BP TID  9.  Thrombocytopenia: reviewed and has resolved  10. Moderate to severe aortic regurgitation: BC X 2 negative so far. S/p TEE. Reviewed with patient and family management of PFO, outpatient follow-up  11. Dyslipidemia: continue Liptor @ 40 mg.     LOS: 6 days A FACE TO FACE EVALUATION WAS PERFORMED  Robert Everett Robert Everett 05/08/2024, 12:53 PM     "

## 2024-05-09 DIAGNOSIS — I639 Cerebral infarction, unspecified: Secondary | ICD-10-CM | POA: Diagnosis not present

## 2024-05-09 LAB — CBC WITH DIFFERENTIAL/PLATELET
Abs Immature Granulocytes: 0.02 K/uL (ref 0.00–0.07)
Basophils Absolute: 0.1 K/uL (ref 0.0–0.1)
Basophils Relative: 1 %
Eosinophils Absolute: 0.3 K/uL (ref 0.0–0.5)
Eosinophils Relative: 4 %
HCT: 35.6 % — ABNORMAL LOW (ref 39.0–52.0)
Hemoglobin: 12.4 g/dL — ABNORMAL LOW (ref 13.0–17.0)
Immature Granulocytes: 0 %
Lymphocytes Relative: 27 %
Lymphs Abs: 1.8 K/uL (ref 0.7–4.0)
MCH: 33.3 pg (ref 26.0–34.0)
MCHC: 34.8 g/dL (ref 30.0–36.0)
MCV: 95.7 fL (ref 80.0–100.0)
Monocytes Absolute: 0.7 K/uL (ref 0.1–1.0)
Monocytes Relative: 10 %
Neutro Abs: 3.9 K/uL (ref 1.7–7.7)
Neutrophils Relative %: 58 %
Platelets: 221 K/uL (ref 150–400)
RBC: 3.72 MIL/uL — ABNORMAL LOW (ref 4.22–5.81)
RDW: 13.3 % (ref 11.5–15.5)
WBC: 6.7 K/uL (ref 4.0–10.5)
nRBC: 0 % (ref 0.0–0.2)

## 2024-05-09 LAB — MAGNESIUM: Magnesium: 2.1 mg/dL (ref 1.7–2.4)

## 2024-05-09 MED ORDER — TAMSULOSIN HCL 0.4 MG PO CAPS
0.8000 mg | ORAL_CAPSULE | Freq: Every day | ORAL | Status: DC
  Administered 2024-05-09 – 2024-05-10 (×2): 0.8 mg via ORAL
  Filled 2024-05-09 (×2): qty 2

## 2024-05-09 MED ORDER — ENOXAPARIN SODIUM 30 MG/0.3ML IJ SOSY
30.0000 mg | PREFILLED_SYRINGE | INTRAMUSCULAR | Status: DC
  Administered 2024-05-09 – 2024-05-11 (×3): 30 mg via SUBCUTANEOUS
  Filled 2024-05-09 (×3): qty 0.3

## 2024-05-09 NOTE — Progress Notes (Signed)
 Occupational Therapy Session Note  Patient Details  Name: Robert Everett MRN: [id number] Date of Birth: Jun 28, 1933  Today's Date: 05/09/2024 OT Individual Time: 1420-1530 OT Individual Time Calculation (min): 70 min    Short Term Goals: Week 1:  OT Short Term Goal 1 (Week 1): Patient will pull pants down for toileting with increased time and contact guard assist OT Short Term Goal 2 (Week 1): Patient will push left arm nto sleeve of shirt with min cueing OT Short Term Goal 3 (Week 1): Patient will transfer to/from shower with min assist OT Short Term Goal 4 (Week 1): Patient will position left arm while up in bed or in wheelchair with min cueing  Skilled Therapeutic Interventions/Progress Updates:  Skilled OT intervention completed with focus on ADL retraining, dynamic standing balance, BUE endurance. Pt received seated in recliner, agreeable to session, very flat. Unrated pain reported in low back; declined meds but did request voltaren  to be applied topically which OT did cleared by nursing. OT offered rest breaks and repositioning throughout for pain reduction.  Pt indicated he had peed in his brief 2-3 times and asked OT to change it. Inquired if pt was aware he needed to go, which was yes, but that it was more effort to get up and go every time that's needed. Encouraged pt to trial timed toileting, as well as making efforts to go to bathroom vs consciously voiding in brief. Completed CGA sit > stand and ambulatory transfers with CGA using QC and verbal cues for positioning as with fatigue pt noted to have increased L lateral lean I.e. bumping into doorway on L.  Transported dependently due to fatigue in w/c <> gym. Pt completed the following intervals while seated at the UE Nustep promote cardiovascular endurance and LUE NMR needed for independence with BADLs and functional transfers: -3 min, level 3, forwards -3 min, level 3, forwards -3 min, level 3, backwards -3 min, level 3,  backwards LUE with ACE to sustain grasp as pt unable for entirety of exercise. Intermittent rest breaks needed for fatigue   Pt participated in the following activities in standing with CGA no AD to address dynamic standing balance and cognitive strategies needed for independence and safety with BADL management: -Trail Making A- 2 min, 0 errors -Trail Making B- 5 min, 10 errors, mod assist needed for sequencing  Back in room, pt requested to return to bed. Transitioned with supervision. Pt remained semi upright in bed, with bed alarm on/activated, and with all needs in reach at end of session.   Therapy Documentation Precautions:  Precautions Precautions: Fall Precaution/Restrictions Comments: hard of hearing Restrictions Weight Bearing Restrictions Per Provider Order: No    Therapy/Group: Individual Therapy  Lorrayne FORBES Fritter, MS, OTR/L  05/09/2024, 3:34 PM

## 2024-05-09 NOTE — Progress Notes (Signed)
 Physical Therapy Session Note  Patient Details  Name: Robert Everett MRN: [id number] Date of Birth: Aug 28, 1933  Today's Date: 05/09/2024 PT Individual Time: 0830-0925 PT Individual Time Calculation (min): 55 min   Short Term Goals: Week 1:  PT Short Term Goal 1 (Week 1): Pt will complete bed mobility with minA PT Short Term Goal 2 (Week 1): Pt will complete bed<>chair transfers with minA PT Short Term Goal 3 (Week 1): Pt will ambulate 150' with minA and LRAD PT Short Term Goal 4 (Week 1): Pt will improve TUG by at least 8 seconds  Skilled Therapeutic Interventions/Progress Updates:    Pt pleased with overall progress and demonstrates progress with LUE as well to this PT. Discussed d/c planning with upcoming date about a week away. Pt excited. Pt reports having ramp access at home - does have rails as well to hold onto.  PT assisted with donning of splint for LUE - pt instructing in technique.   Functional bed mobility with S to come to EOB and CGA for transfer without device to w/c with focus on balance and technique.  Gait training without device (pt elected to just hold QC in RUE for security but did not use it) x 65' with focus on balance, increased step length, upright posture cues, overall endurance and strengthening with CGA.  NMR for dynamic gait and balance through obstacle course navigating turns and stepping over simulated thresholds x 2 trials about 26' each trial without device with overall CGA for balance - occasional cues for L foot placement/attention to not hit the obstacle.  Continued NMR on stairs to negotiate 4 steps with single rail with overall CGA with focus on balance, strengthening and endurance.  Practiced gait without device but use of rail on ramp to simulate home access environment at CGA overall - discussed safety with cold weather/ice and fall risk.   Simulated car transfer to prepare for d/c with CGA overall and good carryover of technique.  Cues times  for sit > stands for anterior weightshift/power up for improved technique.  Discussed AD options - pt has WBQC, SPC and RW already. At this time not using device but may benefit from [facility] at d/c for increased safety.  Rest breaks between activities due to fatigue. Request to return back to bed at end of session with overall S for transfer and bed mobility to adjust himself as well with scooting and bridging. Applied pillow under LUE himself for improved positioning of LUE. All needs in reach.    Therapy Documentation Precautions:  Precautions Precautions: Fall Precaution/Restrictions Comments: hard of hearing Restrictions Weight Bearing Restrictions Per Provider Order: No  Pain:       Therapy/Group: Individual Therapy  [name] Pizza [name] [name] [name], PT, DPT, CBIS  05/09/2024, 9:27 AM

## 2024-05-09 NOTE — Progress Notes (Signed)
 "                                                        PROGRESS NOTE   Subjective/Complaints: No complaints this morning Smiling and pleased with progress, working with therapy Hgb trending down, Lovenox  decreased to 30  ROS: Per HPI above.  +left sided weakness, +low back and neck pain, +nocturia, moving bowels regularly- LBM 1/19   Objective:   No results found.  Recent Labs    05/08/24 0434 05/09/24 0508  WBC 5.7 6.7  HGB 12.5* 12.4*  HCT 36.1* 35.6*  PLT 200 221   Recent Labs    05/08/24 0434  NA 136  K 4.5  CL 102  CO2 26  GLUCOSE 103*  BUN 28*  CREATININE 1.06  CALCIUM  8.9     Intake/Output Summary (Last 24 hours) at 05/09/2024 1050 Last data filed at 05/09/2024 0700 Gross per 24 hour  Intake 672 ml  Output 450 ml  Net 222 ml        Physical Exam: Vital Signs Blood pressure (!) 137/58, pulse (!) 59, temperature 97.7 F (36.5 C), temperature source Oral, resp. rate 18, height 5' 10 (1.778 m), weight 68 kg, SpO2 98%. Gen: no distress, normal appearing.  Sitting upright in bed. HEENT: oral mucosa pink and moist, NCAT Cardio: Bradycardia Chest: normal effort, normal rate of breathing Abd: soft, non-distended Ext: no edema Psych: pleasant, normal affect Skin: intact Neuro: Alert and oriented x3,  LUE strength is 3/5 in EF and EE,  3/5 FF, FA LLE is 3/5 throughout, stable 1/20 sensation is intact.  Hard of hearing   MSK: Mild tenderness to palpation throughout bilateral cervical paraspinals, levator scapula, splenius capitis, and trapezius muscles.  Also, lumbar paraspinals unchanged.    Assessment/Plan: 1. Functional deficits which require 3+ hours per day of interdisciplinary therapy in a comprehensive inpatient rehab setting. Physiatrist is providing close team supervision and 24 hour management of active medical problems listed below. Physiatrist and rehab team continue to assess barriers to discharge/monitor patient progress toward  functional and medical goals  Care Tool:  Bathing    Body parts bathed by patient: Left arm, Chest, Abdomen, Front perineal area, Buttocks, Right upper leg, Left upper leg, Face, Right lower leg   Body parts bathed by helper: Right arm, Left lower leg     Bathing assist Assist Level: Minimal Assistance - Patient > 75%     Upper Body Dressing/Undressing Upper body dressing   What is the patient wearing?: Pull over shirt    Upper body assist Assist Level: Moderate Assistance - Patient 50 - 74%    Lower Body Dressing/Undressing Lower body dressing      What is the patient wearing?: Pants, Incontinence brief     Lower body assist Assist for lower body dressing: Moderate Assistance - Patient 50 - 74%     Toileting Toileting Toileting Activity did not occur (Clothing management and hygiene only): N/A (no void or bm)  Toileting assist Assist for toileting: Moderate Assistance - Patient 50 - 74%     Transfers Chair/bed transfer  Transfers assist     Chair/bed transfer assist level: Contact Guard/Touching assist     Locomotion Ambulation   Ambulation assist      Assist level: Contact Guard/Touching assist Assistive device: No Device  Max distance: 11'   Walk 10 feet activity   Assist     Assist level: Contact Guard/Touching assist Assistive device: No Device   Walk 50 feet activity   Assist    Assist level: Contact Guard/Touching assist Assistive device: No Device    Walk 150 feet activity   Assist    Assist level: Moderate Assistance - Patient - 50 - 74% Assistive device: Hand held assist    Walk 10 feet on uneven surface  activity   Assist Walk 10 feet on uneven surfaces activity did not occur: Safety/medical concerns   Assist level: Contact Guard/Touching assist     Wheelchair     Assist Is the patient using a wheelchair?: Yes Type of Wheelchair: Manual    Wheelchair assist level: Minimal Assistance - Patient > 75% Max  wheelchair distance: 36'    Wheelchair 50 feet with 2 turns activity    Assist        Assist Level: Moderate Assistance - Patient 50 - 74%   Wheelchair 150 feet activity     Assist      Assist Level: Total Assistance - Patient < 25%   Blood pressure (!) 137/58, pulse (!) 59, temperature 97.7 F (36.5 C), temperature source Oral, resp. rate 18, height 5' 10 (1.778 m), weight 68 kg, SpO2 98%.  Medical Problem List and Plan: 1. 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  Chronic LLE DVT/ Antithrombotics: -DVT/anticoagulation:  Pharmaceutical: continue Lovenox , stool occult ordered since Hgb is decreasing             -antiplatelet therapy: DAPT X 3 weeks followed by Plavix  alone.   3. Chronic LBP/ Pain Management: Tylenol  prn. Kpad ordered- asked nursing to please get him new one as current is not working  - 1-17: Voltaren  gel 4 times daily to shoulders and back.  Has as needed muscle rub.  If no improvement, we will consider trigger point injections.  4. Mood/Behavior/Sleep: LCSW to follow for evaluation and support             --Melatonin prn for insomnia.              -antipsychotic agents: N/A  5. Neuropsych/cognition: This patient is capable of making decisions on his own behalf.  6. Constipation: LBM 1/19- asked nursing to offer him prune juice, magnesium  oxide added HS  7. Diastolic hypotension: continue to monitor BP TID  8.  Thrombocytopenia: reviewed and has resolved  9. Moderate to severe aortic regurgitation: BC X 2 negative so far. S/p TEE. Reviewed with patient and family management of PFO, outpatient follow-up  10. Dyslipidemia: continue Liptor @ 40 mg.   11. BPH: flomax  increased to 0.8mg  HS  12. Systolic HTN: flomax  increased to 0.8mg  HS    LOS: 7 days A FACE TO FACE EVALUATION WAS  PERFORMED  Shloka Baldridge P Kismet Facemire 05/09/2024, 10:50 AM     "

## 2024-05-09 NOTE — Progress Notes (Signed)
 Physical Therapy Session Note  Patient Details  Name: Robert Everett MRN: [id number] Date of Birth: [birth year]  Today's Date: 05/09/2024 PT Individual Time: 1102-1200 PT Individual Time Calculation (min): 58 min   Short Term Goals: Week 1:  PT Short Term Goal 1 (Week 1): Pt will complete bed mobility with minA PT Short Term Goal 2 (Week 1): Pt will complete bed<>chair transfers with minA PT Short Term Goal 3 (Week 1): Pt will ambulate 150' with minA and LRAD PT Short Term Goal 4 (Week 1): Pt will improve TUG by at least 8 seconds  Skilled Therapeutic Interventions/Progress Updates: Pt presents supine in bed and agreeable to therapy.  Pt transfers sup to sit w/ supervision.  Pt amb w/o AD to w/c w/ CGA.  Pt wheeled to dayroom for time conservation.  Pt negotiate cone obstacle course w/o AD and CGA x 3.  Pt then performed obstacle course w/ alternating LE tap to cone w/ min/mod A especially w/ WB to LLE.  Ace wrap to sling LUE for improved upright posture.  Pt required seated rest breaks 2/2 tired knees.  Pt amb x 150' w/ QC and CGA into room and to recliner.  Chair alarm on and all needs in reach.     Therapy Documentation Precautions:  Precautions Precautions: Fall Precaution/Restrictions Comments: hard of hearing Restrictions Weight Bearing Restrictions Per Provider Order: No General:   Vital Signs:   Pain:0/10       Therapy/Group: Individual Therapy  Kaushik Maul P Damara Klunder 05/09/2024, 12:35 PM

## 2024-05-10 DIAGNOSIS — I639 Cerebral infarction, unspecified: Secondary | ICD-10-CM | POA: Diagnosis not present

## 2024-05-10 LAB — CBC WITH DIFFERENTIAL/PLATELET
Abs Immature Granulocytes: 0.04 K/uL (ref 0.00–0.07)
Basophils Absolute: 0.1 K/uL (ref 0.0–0.1)
Basophils Relative: 1 %
Eosinophils Absolute: 0.3 K/uL (ref 0.0–0.5)
Eosinophils Relative: 4 %
HCT: 36 % — ABNORMAL LOW (ref 39.0–52.0)
Hemoglobin: 12.4 g/dL — ABNORMAL LOW (ref 13.0–17.0)
Immature Granulocytes: 1 %
Lymphocytes Relative: 27 %
Lymphs Abs: 1.7 K/uL (ref 0.7–4.0)
MCH: 33.2 pg (ref 26.0–34.0)
MCHC: 34.4 g/dL (ref 30.0–36.0)
MCV: 96.5 fL (ref 80.0–100.0)
Monocytes Absolute: 0.6 K/uL (ref 0.1–1.0)
Monocytes Relative: 9 %
Neutro Abs: 3.8 K/uL (ref 1.7–7.7)
Neutrophils Relative %: 58 %
Platelets: 221 K/uL (ref 150–400)
RBC: 3.73 MIL/uL — ABNORMAL LOW (ref 4.22–5.81)
RDW: 13.2 % (ref 11.5–15.5)
WBC: 6.4 K/uL (ref 4.0–10.5)
nRBC: 0 % (ref 0.0–0.2)

## 2024-05-10 NOTE — Progress Notes (Signed)
 Patient ID: Robert Everett, male   DOB: April 09, 1934, 89 y.o.   MRN: [id number]  Have reviewed team conference with patient and family. All continue to be agreeable with targeted d/c date of 01/28 and goals of Supervision/Verbal cueing.

## 2024-05-10 NOTE — Progress Notes (Signed)
 Occupational Therapy Note  Patient Details  Name: Robert Everett MRN: [id number] Date of Birth: 1933-12-14  Occupational Therapist participated in the interdisciplinary team conference, providing clinical information regarding the patient's current status, treatment goals, and weekly focus, including any barriers that need to be addressed. Please see the Inpatient Rehabilitation Team Conference and Plan of Care Update for further details.   Deegan Valentino M 05/10/2024, 11:15 AM

## 2024-05-10 NOTE — Progress Notes (Signed)
 "                                                        PROGRESS NOTE   Subjective/Complaints: C/o earwax in ear and asks for toothpick, discussed that we do not have these here and he said he will ask his wife to bring one, offered ear drops but he defers  ROS: Per HPI above.  +left sided weakness, +low back and neck pain, +nocturia, moving bowels regularly- LBM 1/19, +earwax in ears   Objective:   No results found.  Recent Labs    05/09/24 0508 05/10/24 0517  WBC 6.7 6.4  HGB 12.4* 12.4*  HCT 35.6* 36.0*  PLT 221 221   Recent Labs    05/08/24 0434  NA 136  K 4.5  CL 102  CO2 26  GLUCOSE 103*  BUN 28*  CREATININE 1.06  CALCIUM  8.9     Intake/Output Summary (Last 24 hours) at 05/10/2024 0951 Last data filed at 05/10/2024 0700 Gross per 24 hour  Intake 708 ml  Output --  Net 708 ml        Physical Exam: Vital Signs Blood pressure (!) 151/48, pulse 70, temperature 98 F (36.7 C), resp. rate 17, height 5' 10 (1.778 m), weight 68 kg, SpO2 95%. Gen: no distress, normal appearing.  Sitting upright in bed. HEENT: oral mucosa pink and moist, NCAT Cardio: Bradycardia Chest: normal effort, normal rate of breathing Abd: soft, non-distended Ext: no edema Psych: pleasant, normal affect Skin: intact Neuro: Alert and oriented x3,  LUE strength is 3/5 in EF and EE,  3/5 FF, FA LLE is 3/5 throughout, stable 1/21 sensation is intact.  Hard of hearing   MSK: Mild tenderness to palpation throughout bilateral cervical paraspinals, levator scapula, splenius capitis, and trapezius muscles.  Also, lumbar paraspinals unchanged.    Assessment/Plan: 1. Functional deficits which require 3+ hours per day of interdisciplinary therapy in a comprehensive inpatient rehab setting. Physiatrist is providing close team supervision and 24 hour management of active medical problems listed below. Physiatrist and rehab team continue to assess barriers to discharge/monitor patient  progress toward functional and medical goals  Care Tool:  Bathing    Body parts bathed by patient: Left arm, Chest, Abdomen, Front perineal area, Buttocks, Right upper leg, Left upper leg, Face, Right lower leg   Body parts bathed by helper: Right arm, Left lower leg     Bathing assist Assist Level: Minimal Assistance - Patient > 75%     Upper Body Dressing/Undressing Upper body dressing   What is the patient wearing?: Pull over shirt    Upper body assist Assist Level: Moderate Assistance - Patient 50 - 74%    Lower Body Dressing/Undressing Lower body dressing      What is the patient wearing?: Pants, Incontinence brief     Lower body assist Assist for lower body dressing: Moderate Assistance - Patient 50 - 74%     Toileting Toileting Toileting Activity did not occur (Clothing management and hygiene only): N/A (no void or bm)  Toileting assist Assist for toileting: Moderate Assistance - Patient 50 - 74%     Transfers Chair/bed transfer  Transfers assist     Chair/bed transfer assist level: Contact Guard/Touching assist     Locomotion Ambulation   Ambulation assist  Assist level: Contact Guard/Touching assist Assistive device: Cane-quad Max distance: 150   Walk 10 feet activity   Assist     Assist level: Contact Guard/Touching assist Assistive device: Cane-quad   Walk 50 feet activity   Assist    Assist level: Contact Guard/Touching assist Assistive device: Cane-quad    Walk 150 feet activity   Assist    Assist level: Contact Guard/Touching assist Assistive device: Cane-quad    Walk 10 feet on uneven surface  activity   Assist Walk 10 feet on uneven surfaces activity did not occur: Safety/medical concerns   Assist level: Contact Guard/Touching assist     Wheelchair     Assist Is the patient using a wheelchair?: Yes Type of Wheelchair: Manual    Wheelchair assist level: Minimal Assistance - Patient > 75% Max  wheelchair distance: 58'    Wheelchair 50 feet with 2 turns activity    Assist        Assist Level: Moderate Assistance - Patient 50 - 74%   Wheelchair 150 feet activity     Assist      Assist Level: Total Assistance - Patient < 25%   Blood pressure (!) 151/48, pulse 70, temperature 98 F (36.7 C), resp. rate 17, height 5' 10 (1.778 m), weight 68 kg, SpO2 95%.  Medical Problem List and Plan: 1. 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  Chronic LLE DVT/ Antithrombotics: -DVT/anticoagulation:  Pharmaceutical: continue Lovenox , stool occult ordered since Hgb is decreasing             -antiplatelet therapy: DAPT X 3 weeks followed by Plavix  alone.   3. Chronic LBP/ Pain Management: Tylenol  prn. Kpad ordered- asked nursing to please get him new one as current is not working  - 1-17: Voltaren  gel 4 times daily to shoulders and back.  Has as needed muscle rub.  If no improvement, we will consider trigger point injections.  4. Mood/Behavior/Sleep: LCSW to follow for evaluation and support             --Melatonin prn for insomnia.              -antipsychotic agents: N/A  5. Neuropsych/cognition: This patient is capable of making decisions on his own behalf.  6. Constipation: LBM 1/19- messaged nursing to confirm accuracy, asked nursing to please offer him prune juice, magnesium  oxide added HS  7. Diastolic hypotension: continue to monitor BP TID  8.  Thrombocytopenia: reviewed and has resolved  9. Moderate to severe aortic regurgitation: BC X 2 negative so far. S/p TEE. Reviewed with patient and family management of PFO, outpatient follow-up  10. Dyslipidemia: continue Liptor @ 40 mg.   11. BPH: flomax  increased to 0.8mg  HS, continue this dose  12. Systolic HTN: flomax  increased to 0.8mg  HS, continue this dose    LOS: 8  days A FACE TO FACE EVALUATION WAS PERFORMED  Robert Everett Robert Everett 05/10/2024, 9:51 AM     "

## 2024-05-10 NOTE — Progress Notes (Signed)
 Occupational Therapy Weekly Progress Note  Patient Details  Name: Robert Everett MRN: [id number] Date of Birth: 1933/07/26  Beginning of progress report period: May 03, 2024 End of progress report period: May 10, 2024  Today's Date: 05/10/2024 OT Individual Time: 8591-8489 OT Individual Time Calculation (min): 62 min    Patient has met 3 of 4 short term goals.  Patient is showing significant improvement in functional mobility and active movement in LUE.  Patient continues to demonstrate the following deficits: impaired timing and sequencing, abnormal tone, unbalanced muscle activation, decreased coordination, and decreased motor planning and therefore will continue to benefit from skilled OT intervention to enhance overall performance with BADL and Reduce care partner burden.  Patient progressing toward long term goals..  Continue plan of care.  OT Short Term Goals Week 1:  OT Short Term Goal 1 (Week 1): Patient will pull pants down for toileting with increased time and contact guard assist OT Short Term Goal 1 - Progress (Week 1): Progressing toward goal OT Short Term Goal 2 (Week 1): Patient will push left arm nto sleeve of shirt with min cueing OT Short Term Goal 2 - Progress (Week 1): Met OT Short Term Goal 3 (Week 1): Patient will transfer to/from shower with min assist OT Short Term Goal 3 - Progress (Week 1): Met OT Short Term Goal 4 (Week 1): Patient will position left arm while up in bed or in wheelchair with min cueing OT Short Term Goal 4 - Progress (Week 1): Met Week 2:  OT Short Term Goal 1 (Week 2): STG=LTG due to LOS  Skilled Therapeutic Interventions/Progress Updates:   Patient received seated in chair in room.  Patient had called for assist prior to OT session to use the bathroom and reported continent episode. Patient walked part way to gym.  Patient requesting to take quad cane, but literally carried it - not touching the floor.   Worked in gym on LUE  functional ability.  Patient has isolated movement now at shoulder, elbow, wrist, and hand.  Lacks full finger flexion.  Patient has limited ability to use arm functionally despite new movement.  Patient has inability to sustain muscle activation, or to activate more than one muscle group at a time.   Worked on digit flexion and extension to grasp and release 1 block.  Patient with slightly improved performance with blocked practice. Provided NMES to dorsal forearm to activate first wrist / digit extensors, than to volar forearm for flexors.  Worked @ - 10 / 10 cycle x 5 min each side.  Patient able to follow up with active composite flex/ext through 75% available range!  Able to register 18lb grasp with dynamometer.  Patient very pleased with progress.  Walked back to room from main gym while pushing wheelchair with two hands.  Left up in recliner with safety belt in place and engaged and call bell and personal items in reach.     Therapy Documentation Precautions:  Precautions Precautions: Fall Precaution/Restrictions Comments: hard of hearing Restrictions Weight Bearing Restrictions Per Provider Order: No General:   Vital Signs:   Pain:   ADL: ADL Eating: Unable to assess Grooming: Moderate assistance Where Assessed-Grooming: Sitting at sink Upper Body Bathing: Moderate assistance Where Assessed-Upper Body Bathing: Sitting at sink Lower Body Bathing: Moderate assistance Where Assessed-Lower Body Bathing: Sitting at sink, Standing at sink Upper Body Dressing: Moderate assistance Where Assessed-Upper Body Dressing: Sitting at sink Lower Body Dressing: Moderate assistance Where Assessed-Lower Body Dressing: Sitting  at sink Toileting: Moderate assistance Where Assessed-Toileting: Teacher, Adult Education: Moderate assistance Toilet Transfer Method: Stand pivot Tub/Shower Transfer: Unable to assess Tub/Shower Transfer Method: Unable to assess Film/video Editor: Unable  to assess Vision   Perception    Praxis   Exercises:   Other Treatments:     Therapy/Group: Individual Therapy  Marticia Reifschneider M 05/10/2024, 3:18 PM

## 2024-05-10 NOTE — Progress Notes (Signed)
 Occupational Therapy Session Note  Patient Details  Name: Robert Everett MRN: [id number] Date of Birth: 11/18/1933  Today's Date: 05/10/2024 OT Individual Time: 1131-1158 OT Individual Time Calculation (min): 27 min    Short Term Goals: Week 1:  OT Short Term Goal 1 (Week 1): Patient will pull pants down for toileting with increased time and contact guard assist OT Short Term Goal 2 (Week 1): Patient will push left arm nto sleeve of shirt with min cueing OT Short Term Goal 3 (Week 1): Patient will transfer to/from shower with min assist OT Short Term Goal 4 (Week 1): Patient will position left arm while up in bed or in wheelchair with min cueing  Skilled Therapeutic Interventions/Progress Updates:   Patient received supine in bed.  Patient with significantly improved active movement in LUE.  Patient pleased with progress but reports having difficulty grasping items with left hand.   Discussed discharge plan.  Equipment:  Patient declines needing a BSC as his wife has one, with a grab bar on right.  Patient declines needing transfer tub bench as his tub has a built in seat which he has used to wash his feet prior to his stroke.  Will need to practice tub trasnfer with patient prior to discharge to determine if this is reasonable.   Discussed driving.  Explained to patient that he should not drive upon discharge from hospital and he should wait until he receives medical clearance to drive - several weeks.  Patient was primary driver, but his grandson is able to drive to assist daughter and wife/ patient.   Patient opted to stay in bed for lunch.  Declined any other personal needs.  Reviewed plan for next session.  Bed alarm engaged and call bell/ personal items in reach.  Therapy Documentation Precautions:  Precautions Precautions: Fall Precaution/Restrictions Comments: hard of hearing Restrictions Weight Bearing Restrictions Per Provider Order: No   Pain: Pain Assessment Pain Scale:  0-10 Pain Score: 0-No pain    Therapy/Group: Individual Therapy  Cleburne Savini M 05/10/2024, 12:08 PM

## 2024-05-10 NOTE — Progress Notes (Signed)
 Physical Therapy Session Note  Patient Details  Name: Robert Everett MRN: [id number] Date of Birth: 1933-10-24  Today's Date: 05/10/2024 PT Individual Time: 8454-8354 PT Individual Time Calculation (min): 60 min  and Today's Date: 05/10/2024 PT Missed Time: 15 Minutes Missed Time Reason: Patient fatigue  Short Term Goals: Week 1:  PT Short Term Goal 1 (Week 1): Pt will complete bed mobility with minA PT Short Term Goal 2 (Week 1): Pt will complete bed<>chair transfers with minA PT Short Term Goal 3 (Week 1): Pt will ambulate 150' with minA and LRAD PT Short Term Goal 4 (Week 1): Pt will improve TUG by at least 8 seconds  Skilled Therapeutic Interventions/Progress Updates:      Pt sitting in recliner to start - has no c/o pain and in agreement to therapy treatment. Reports LUE fatigue from earlier OT treatment.   Pt completed sit<>stand and ambulatory transfer with CGA and QC within his room. Improved confidence being on his feet and navigating around furniture.   Patient taken to main gym at w/c level for energy conservation.   He was instructed in horse shoe toss in unsupported standing position - CGA for balance and safety, had no overt LOB while tossing outside BOS. Pt then instructed in item retrieval to practice picking up objects from the ground in standing, needing CGA for safety as he picks up each horse shoe he tossed with his RUE.   Pt completed repeated forward step up/down using 4 step with no AD or UE support, minA for balance, 2x15. Then completed lateral step up/down with the same 4 step with no AD or UE support, minA for balance due to L trunk lean with dynamic balance.   Pt reporting moderate fatigue levels at this point of the session, reports feeling worn out.   Pt finished session on seated stepper (kinetron) with resistance set to 70cm/sec resistance. Completed 8 minutes total. Pt reporting high levels of fatigue and pleasantly requesting to return to his room  to rest.   Pt ended session in his recliner, seat belt alarm on, and call bell in reach. He missed the last 15 minutes of therapy due to fatigue.   Therapy Documentation Precautions:  Precautions Precautions: Fall Precaution/Restrictions Comments: hard of hearing Restrictions Weight Bearing Restrictions Per Provider Order: No General: PT Amount of Missed Time (min): 15 Minutes PT Missed Treatment Reason: Patient fatigue    Therapy/Group: Individual Therapy  [name] 05/10/2024, 4:45 PM

## 2024-05-10 NOTE — Progress Notes (Signed)
 Speech Language Pathology Daily Session Note  Patient Details  Name: Robert Everett MRN: [id number] Date of Birth: [birth year]  Today's Date: 05/10/2024 SLP Individual Time: 9154-9069 SLP Individual Time Calculation (min): 45 min  Short Term Goals: Week 1: SLP Short Term Goal 1 (Week 1): Patient will recall cognitive and physical changes with min verbal A SLP Short Term Goal 2 (Week 1): Patient will demonstrate problem solving skills in functional situations given min mulitmodal A  Skilled Therapeutic Interventions:   Pt greeted at bedside for tx targeting cognition. He was awake in bed upon SLP arrival, agreeable to tx tasks. SLP facilitated orientation review and recall of recent events. He was independently oriented x4. He also required only supervisionA for recall of detailed events in therapy yesterday d/t transposition of events/times. He then participated in conversational task re complex problem solving and planning for upcoming d/c. He benefited from minA overall, though did demonstrate slightly reduced anticipatory awareness. Despite this, he reported assistance is readily available from family (son and grandsons). He did demonstrate adequate problem solving during functional task cleaning his hearing aids. At the end of tx tasks, he was left in bed w/ the alarm set and call light within reach. Recommend cont ST per POC.   Pain  No pain reported  Therapy/Group: Individual Therapy  [name] 05/10/2024, 1:46 PM

## 2024-05-10 NOTE — Patient Care Conference (Signed)
 Inpatient RehabilitationTeam Conference and Plan of Care Update Date: 05/10/2024   Time: 11:11 AM    Patient Name: Robert Everett      Medical Record Number: [id number]  Date of Birth: 07/04/1933 Sex: Male         Room/Bed: 4W02C/4W02C-01 Payor Info: Payor: [employer] MEDICARE / Plan: [facility] MEDICARE / Product Type: *No Product type* /    Admit Date/Time:  05/02/2024  5:11 PM  Primary Diagnosis:  Small vessel stroke [facility])  Hospital Problems: Principal Problem:   Small vessel stroke [facility])    Expected Discharge Date: Expected Discharge Date: 05/17/24  Team Members Present: Physician leading conference: Dr. Sven Elks Social Worker Present: Waverly Gentry, LCSW-A Nurse Present: Barnie Ronde, RN PT Present: Sherlean Perks, PT OT Present: Monica Peacock, OT SLP Present: Rosina Downy, SLP PPS Coordinator present : Eleanor Colon, SLP     Current Status/Progress Goal Weekly Team Focus  Bowel/Bladder   pt is cont of B/B LBM: 1/20 - stool sample sent with no label, new sample needed   remain cont of b/b   Timed toileting q shift and PRN    Swallow/Nutrition/ Hydration               ADL's   Mod- approaching min assist.  Needs encourgaement for improved independence   Intermittent min assist   Decrease assist with BADL, Improve LUE functioning    Mobility   supervision bed mobility, CGA stand pivot transfers, CGA/minA gait using QC 150'. Improving L sided strength and balance   CGA  DC planning, LLE NMR, dynamic gait and balance training, family ed    Communication                Safety/Cognition/ Behavioral Observations  mild to moderate deficits in the areas of memory, complex problem solving, awareness   supervisionA   pt/family education, cognitive re training, compensatory strategies    Pain   pt states that he has discomfort in neck and shoulders - muscle cream and voltaren  gel used   patient is comfortable   pain assessment q shift and PRN     Skin   pt skin is intact   skin integrity remain intact  skin assessment q shift and PRN      Discharge Planning:  Plans to discharge home with spouse and support from daughter and grandson. Has some DME..will await verification on cane. Awaiting therapy follow-up recommendations.   Team Discussion: Patient admitted post multi cryptogenic CVAs with SVD, left upper extremity hemiplegia and left lower extremity weakness and dysarthria. Progress limited by mild- moderate cognitive issues, back pain, poor initiation and delayed reaction time.  Patient on target to meet rehab goals: yes, currently needs min - mod assist for self care; needs encouragement to be more independent. Needs supervision for bed mobility and min assist for transfers. Able to ambulate using a quad cane. Working on standing balance/dynamic balance with gait. Goals for discharge set for intermittent min assist with self care and CGA - supervision with mobility and supervision for cognition.  *See Care Plan and progress notes for long and short-term goals.   Revisions to Treatment Plan:  N/a   Teaching Needs: Safety, medications, transfers, toileting, etc.   Current Barriers to Discharge: Decreased caregiver support and Home enviroment access/layout  Possible Resolutions to Barriers: Family education Ramp for entry home     Medical Summary Current Status: systolic hypertension, diastolic hypotension, constipation, nocturia, CVA, low back pain  Barriers to Discharge: Medical  stability  Barriers to Discharge Comments: systolic hypertension, diastolic hypotension, constipation, nocturia, CVA, low back pain Possible Resolutions to [employer] Focus: contiue to monitor BP TID, flomax  started after supper, continue magnesium  supplement and prune juice, continue aspirin /statin, kpad ordered, voltaren  gel ordered   Continued Need for Acute Rehabilitation Level of Care: The patient requires daily medical management  by a physician with specialized training in physical medicine and rehabilitation for the following reasons: Direction of a multidisciplinary physical rehabilitation program to maximize functional independence : Yes Medical management of patient stability for increased activity during participation in an intensive rehabilitation regime.: Yes Analysis of laboratory values and/or radiology reports with any subsequent need for medication adjustment and/or medical intervention. : Yes   I attest that I was present, lead the team conference, and concur with the assessment and plan of the team.   Fredericka Sober B 05/10/2024, 3:00 PM

## 2024-05-11 LAB — BASIC METABOLIC PANEL WITH GFR
Anion gap: 7 (ref 5–15)
BUN: 25 mg/dL — ABNORMAL HIGH (ref 8–23)
CO2: 26 mmol/L (ref 22–32)
Calcium: 9.2 mg/dL (ref 8.9–10.3)
Chloride: 104 mmol/L (ref 98–111)
Creatinine, Ser: 1.07 mg/dL (ref 0.61–1.24)
GFR, Estimated: >60 mL/min
Glucose, Bld: 112 mg/dL — ABNORMAL HIGH (ref 70–99)
Potassium: 4.7 mmol/L (ref 3.5–5.1)
Sodium: 137 mmol/L (ref 135–145)

## 2024-05-11 LAB — CBC
HCT: 36.9 % — ABNORMAL LOW (ref 39.0–52.0)
Hemoglobin: 12.8 g/dL — ABNORMAL LOW (ref 13.0–17.0)
MCH: 33.4 pg (ref 26.0–34.0)
MCHC: 34.7 g/dL (ref 30.0–36.0)
MCV: 96.3 fL (ref 80.0–100.0)
Platelets: 222 K/uL (ref 150–400)
RBC: 3.83 MIL/uL — ABNORMAL LOW (ref 4.22–5.81)
RDW: 13.3 % (ref 11.5–15.5)
WBC: 6.9 K/uL (ref 4.0–10.5)
nRBC: 0 % (ref 0.0–0.2)

## 2024-05-11 LAB — GLUCOSE, CAPILLARY: Glucose-Capillary: 106 mg/dL — ABNORMAL HIGH (ref 70–99)

## 2024-05-11 MED ORDER — TAMSULOSIN HCL 0.4 MG PO CAPS
0.4000 mg | ORAL_CAPSULE | Freq: Every day | ORAL | Status: DC
  Administered 2024-05-11 – 2024-05-16 (×6): 0.4 mg via ORAL
  Filled 2024-05-11 (×6): qty 1

## 2024-05-11 NOTE — Progress Notes (Signed)
 Occupational Therapy Session Note  Patient Details  Name: Robert Everett MRN: [id number] Date of Birth: [birth year]  Today's Date: 05/11/2024 OT Individual Time: 9064-8954 OT Individual Time Calculation (min): 70 min    Short Term Goals: Week 2:  OT Short Term Goal 1 (Week 2): STG=LTG due to LOS  Skilled Therapeutic Interventions/Progress Updates:   Patient received seated in recliner.  Patient eager to show improvement in his arm movement since yesterday.  Patient with more prolonged grasp - although still delayed response.  Improved shoulder flexion, elbow extension, and wrist extension.   Patient walked to therapy gym pushing wheelchair.  Able to use left arm in active extension and maintain hand on grip.  Patient cued for upright stance, and longer stride length.   Worked in modified plantigrade to address weight bearing and loading through LUE.  Followed with active movement in left arm - carrying a ball bilaterally.   Worked on isolated control of distal left arm - wrist and hand.  Picking up 1 blocks and stacking.  Worked on more sustained grasp and release with light resistance putty.   Patient pleased with progress.  Patient left up in recliner at end of session with call bell and personal items in reach, and safety belt in place and engaged.     Therapy Documentation Precautions:  Precautions Precautions: Fall Precaution/Restrictions Comments: hard of hearing Restrictions Weight Bearing Restrictions Per Provider Order: No   Pain:  Denies pain    Therapy/Group: Individual Therapy  Faelynn Wynder M 05/11/2024, 12:42 PM

## 2024-05-11 NOTE — Progress Notes (Signed)
 "                                                        PROGRESS NOTE   Subjective/Complaints: He was able to remove his earwax Hgb is trending upward Discussed that flomax  has helped somewhat with nocturia but not much  ROS: Per HPI above.  +left sided weakness, +low back and neck pain, +nocturia- improved a little with flomax , moving bowels regularly- LBM 1/19, +earwax in ears   Objective:   No results found.  Recent Labs    05/10/24 0517 05/11/24 0555  WBC 6.4 6.9  HGB 12.4* 12.8*  HCT 36.0* 36.9*  PLT 221 222   Recent Labs    05/11/24 0555  NA 137  K 4.7  CL 104  CO2 26  GLUCOSE 112*  BUN 25*  CREATININE 1.07  CALCIUM  9.2     Intake/Output Summary (Last 24 hours) at 05/11/2024 1001 Last data filed at 05/11/2024 0700 Gross per 24 hour  Intake 826 ml  Output --  Net 826 ml        Physical Exam: Vital Signs Blood pressure (!) 129/43, pulse 60, temperature 98.3 F (36.8 C), temperature source Oral, resp. rate 17, height 5' 10 (1.778 m), weight 68 kg, SpO2 96%. Gen: no distress, normal appearing.  Sitting upright in bed. HEENT: oral mucosa pink and moist, NCAT Cardio: Bradycardia Chest: normal effort, normal rate of breathing Abd: soft, non-distended Ext: no edema Psych: pleasant, normal affect Skin: intact Neuro: Alert and oriented x3,  LUE strength is 3/5 in EF and EE,  3/5 FF, FA LLE is 3/5 throughout, stable 1/22 sensation is intact.  Hard of hearing   MSK: Mild tenderness to palpation throughout bilateral cervical paraspinals, levator scapula, splenius capitis, and trapezius muscles.  Also, lumbar paraspinals unchanged.    Assessment/Plan: 1. Functional deficits which require 3+ hours per day of interdisciplinary therapy in a comprehensive inpatient rehab setting. Physiatrist is providing close team supervision and 24 hour management of active medical problems listed below. Physiatrist and rehab team continue to assess barriers to  discharge/monitor patient progress toward functional and medical goals  Care Tool:  Bathing    Body parts bathed by patient: Left arm, Chest, Abdomen, Front perineal area, Buttocks, Right upper leg, Left upper leg, Face, Right lower leg   Body parts bathed by helper: Right arm, Left lower leg     Bathing assist Assist Level: Minimal Assistance - Patient > 75%     Upper Body Dressing/Undressing Upper body dressing   What is the patient wearing?: Pull over shirt    Upper body assist Assist Level: Moderate Assistance - Patient 50 - 74%    Lower Body Dressing/Undressing Lower body dressing      What is the patient wearing?: Pants, Incontinence brief     Lower body assist Assist for lower body dressing: Moderate Assistance - Patient 50 - 74%     Toileting Toileting Toileting Activity did not occur (Clothing management and hygiene only): N/A (no void or bm)  Toileting assist Assist for toileting: Moderate Assistance - Patient 50 - 74%     Transfers Chair/bed transfer  Transfers assist     Chair/bed transfer assist level: Contact Guard/Touching assist     Locomotion Ambulation   Ambulation assist  Assist level: Contact Guard/Touching assist Assistive device: Cane-quad Max distance: 150   Walk 10 feet activity   Assist     Assist level: Contact Guard/Touching assist Assistive device: Cane-quad   Walk 50 feet activity   Assist    Assist level: Contact Guard/Touching assist Assistive device: Cane-quad    Walk 150 feet activity   Assist    Assist level: Contact Guard/Touching assist Assistive device: Cane-quad    Walk 10 feet on uneven surface  activity   Assist Walk 10 feet on uneven surfaces activity did not occur: Safety/medical concerns   Assist level: Contact Guard/Touching assist     Wheelchair     Assist Is the patient using a wheelchair?: Yes Type of Wheelchair: Manual    Wheelchair assist level: Minimal  Assistance - Patient > 75% Max wheelchair distance: 81'    Wheelchair 50 feet with 2 turns activity    Assist        Assist Level: Moderate Assistance - Patient 50 - 74%   Wheelchair 150 feet activity     Assist      Assist Level: Total Assistance - Patient < 25%   Blood pressure (!) 129/43, pulse 60, temperature 98.3 F (36.8 C), temperature source Oral, resp. rate 17, height 5' 10 (1.778 m), weight 68 kg, SpO2 96%.  Medical Problem List and Plan: 1. 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  Chronic LLE DVT/ Antithrombotics: -DVT/anticoagulation:  Pharmaceutical: continue Lovenox , stool occult ordered since Hgb is decreasing             -antiplatelet therapy: DAPT X 3 weeks followed by Plavix  alone.   3. Chronic LBP/ Pain Management: Tylenol  prn. Kpad ordered- asked nursing to please get him new one as current is not working  - 1-17: Voltaren  gel 4 times daily to shoulders and back.  Has as needed muscle rub.  If no improvement, we will consider trigger point injections.  4. Mood/Behavior/Sleep: LCSW to follow for evaluation and support             --Melatonin prn for insomnia.              -antipsychotic agents: N/A  5. Neuropsych/cognition: This patient is capable of making decisions on his own behalf.  6. Constipation: LBM 1/21- asked nursing to please offer him prune juice, magnesium  oxide added HS  7. Diastolic hypotension: continue to monitor BP TID  8.  Thrombocytopenia: reviewed and has resolved  9. Moderate to severe aortic regurgitation: BC X 2 negative so far. S/p TEE. Reviewed with patient and family management of PFO, outpatient follow-up  10. Dyslipidemia: continue Liptor @ 40 mg.   11. BPH: decreased flomax  to 0.4mg  as per patient's request  12. Systolic HTN: decrease flomax  to 0.4mg  improved     LOS: 9 days A FACE TO FACE EVALUATION WAS PERFORMED  Shamel Galyean P Yehudah Standing 05/11/2024, 10:01 AM     "

## 2024-05-11 NOTE — Progress Notes (Signed)
 Physical Therapy Session Note  Patient Details  Name: Robert Everett MRN: [id number] Date of Birth: Apr 27, 1933  Today's Date: 05/11/2024 PT Individual Time: 9199-9143 + 8584-8485 PT Individual Time Calculation (min): 56 min  + 74 min  Short Term Goals: Week 1:  PT Short Term Goal 1 (Week 1): Pt will complete bed mobility with minA PT Short Term Goal 2 (Week 1): Pt will complete bed<>chair transfers with minA PT Short Term Goal 3 (Week 1): Pt will ambulate 150' with minA and LRAD PT Short Term Goal 4 (Week 1): Pt will improve TUG by at least 8 seconds  Skilled Therapeutic Interventions/Progress Updates:      1st session: Pt lying in bed to start. Has no c/o pain. Noted bed sheets to be saturated with urine, pt seemingly unaware. Offered patient a shower but he declined.   Supine<>Sitting EOB with supervision. Sit<>stand using QC with supervision from EOB - ambulates with CGA and QC into the bathroom where he's continent of bladder void once sitting on toilet. New clean brief and pants provided - assist for donning. Pt with some learned helplessness and needs encouragement for performing ADL's on his own. With encouragement, pt agreeable to brush/clean his dentures at the sink - completed at seated level with setupA as needed.   Pt taken to main gym at w/c level for energy conservation. Sit<>stand using QC with supervision form w/c with cues to push from armrests. Pt completed ambulation 200' with supervision and QC - improved gait speed, confidence, and balance with turns.   Pt assessed in falls risk using balance assessment with BERG testing. See below for details. Pt needing intermittent seated rest breaks given his fatigue during the test.   Patient demonstrates increased fall risk as noted by score of   33/56 on Berg Balance Scale.  (<36= high risk for falls, close to 100%; 37-45 significant >80%; 46-51 moderate >50%; 52-55 lower >25%)  Pt returned to his room and he was assisted to  the recliner. Seat belt alarm on, call bell in lap and pillow supporting his LUE. Pt made aware of upcoming therapy schedule.     2nd session: Pt in bed to start - has no c/o pain but requesting to use the bathroom to urinate.   Supine<>sitting EOB with supervision, sit<>stand to QC with supervision, and ambulates with CGA and QC into the bathroom. Pt requesting assist to pull down his pants, encouraged him to use his RUE to help with removing brief/pants to promote functional independence.   Pt continent of bladder void once sitting on toilet (charted).   Pt returned to his w/c and then transported at w/c level to main gym. Gait training 39' with supervision and QC - cues for increasing gait speed, cane sequencing, and improving upright posture. Pt's paretic L limb hanging affects posture.   Stair training using both 3 and 6 steps with 1 hand rail on his R side - completed with CGA to SBA with min cues for sequencing for R foot leading ascent and L leading descent. X12 steps completed total.   Objects scattered on floor of main gym to practice and simulate picking up sticks from the ground, since patient does this a lot at home - pt navigating gym environment without the use of cane or AD use - CGA to SBA for safety. Able to pick up several (>10) objects from ground level while standing with CGA and no LOB.   Pt instructed in dynamic gait training with obstacle  course - stepping over hurdles, side stepping across foam balance beam, and ambulating across compliant surfaces (mat on ground). Pt completed all with CGA except for balance beam where he needed minA for stability and balance.   Pt finished session on seated Kinetron with resistance set to 65cm/sec. Completed x5 minutes with a brief 30 second break in the middle. Cues for full ROM on his LLE and sustaining his SPM for duration of task.   Pt returned to his room and assisted to bed with ambulatory transfer at [facility] level. Discussed with  him upgrading his goals from supervision to mod I and patient pleased with his progress.   Therapy Documentation Precautions:  Precautions Precautions: Fall Precaution/Restrictions Comments: hard of hearing Restrictions Weight Bearing Restrictions Per Provider Order: No General:    Balance: Balance Balance Assessed: Yes Standardized Balance Assessment Standardized Balance Assessment: Berg Balance Test Berg Balance Test Sit to Stand: Able to stand  independently using hands Standing Unsupported: Able to stand safely 2 minutes Sitting with Back Unsupported but Feet Supported on Floor or Stool: Able to sit safely and securely 2 minutes Stand to Sit: Controls descent by using hands Transfers: Able to transfer safely, definite need of hands Standing Unsupported with Eyes Closed: Able to stand 10 seconds with supervision Standing Ubsupported with Feet Together: Able to place feet together independently but unable to hold for 30 seconds From Standing, Reach Forward with Outstretched Arm: Can reach forward >5 cm safely (2) From Standing Position, Pick up Object from Floor: Able to pick up shoe, needs supervision From Standing Position, Turn to Look Behind Over each Shoulder: Turn sideways only but maintains balance Turn 360 Degrees: Able to turn 360 degrees safely but slowly Standing Unsupported, Alternately Place Feet on Step/Stool: Able to complete >2 steps/needs minimal assist Standing Unsupported, One Foot in Front: Needs help to step but can hold 15 seconds Standing on One Leg: Unable to try or needs assist to prevent fall Total Score: 33   Therapy/Group: Individual Therapy  Robert Everett 05/11/2024, 8:45 AM

## 2024-05-11 NOTE — Plan of Care (Signed)
 Goals upgraded from supervision to mod I given his progress  Problem: RH Balance Goal: LTG Patient will maintain dynamic sitting balance (PT) Description: LTG:  Patient will maintain dynamic sitting balance with assistance during mobility activities (PT) Flowsheets (Taken 05/11/2024 1514) LTG: Pt will maintain dynamic sitting balance during mobility activities with:: Independent with assistive device  Goal: LTG Patient will maintain dynamic standing balance (PT) Description: LTG:  Patient will maintain dynamic standing balance with assistance during mobility activities (PT) Flowsheets (Taken 05/11/2024 1514) LTG: Pt will maintain dynamic standing balance during mobility activities with:: Independent with assistive device    Problem: Sit to Stand Goal: LTG:  Patient will perform sit to stand with assistance level (PT) Description: LTG:  Patient will perform sit to stand with assistance level (PT) Flowsheets (Taken 05/11/2024 1514) LTG: PT will perform sit to stand in preparation for functional mobility with assistance level: Independent with assistive device   Problem: RH Bed Mobility Goal: LTG Patient will perform bed mobility with assist (PT) Description: LTG: Patient will perform bed mobility with assistance, with/without cues (PT). Flowsheets (Taken 05/11/2024 1514) LTG: Pt will perform bed mobility with assistance level of: Independent with assistive device    Problem: RH Bed to Chair Transfers Goal: LTG Patient will perform bed/chair transfers w/assist (PT) Description: LTG: Patient will perform bed to chair transfers with assistance (PT). Flowsheets (Taken 05/11/2024 1514) LTG: Pt will perform Bed to Chair Transfers with assistance level: Independent with assistive device    Problem: RH Car Transfers Goal: LTG Patient will perform car transfers with assist (PT) Description: LTG: Patient will perform car transfers with assistance (PT). Flowsheets (Taken 05/11/2024 1514) LTG: Pt will  perform car transfers with assist:: Independent with assistive device    Problem: RH Ambulation Goal: LTG Patient will ambulate in controlled environment (PT) Description: LTG: Patient will ambulate in a controlled environment, # of feet with assistance (PT). Flowsheets (Taken 05/11/2024 1514) LTG: Pt will ambulate in controlled environ  assist needed:: Independent with assistive device LTG: Ambulation distance in controlled environment: 150' Goal: LTG Patient will ambulate in home environment (PT) Description: LTG: Patient will ambulate in home environment, # of feet with assistance (PT). Flowsheets (Taken 05/11/2024 1514) LTG: Pt will ambulate in home environ  assist needed:: Independent with assistive device LTG: Ambulation distance in home environment: 50'   Problem: RH Stairs Goal: LTG Patient will ambulate up and down stairs w/assist (PT) Description: LTG: Patient will ambulate up and down # of stairs with assistance (PT) Flowsheets (Taken 05/11/2024 1514) LTG: Pt will ambulate up/down stairs assist needed:: Supervision/Verbal cueing LTG: Pt will  ambulate up and down number of stairs: at least 12 steps with LRAD

## 2024-05-12 MED ORDER — ENOXAPARIN SODIUM 40 MG/0.4ML IJ SOSY
40.0000 mg | PREFILLED_SYRINGE | INTRAMUSCULAR | Status: DC
  Administered 2024-05-12 – 2024-05-16 (×5): 40 mg via SUBCUTANEOUS
  Filled 2024-05-12 (×5): qty 0.4

## 2024-05-12 NOTE — Progress Notes (Signed)
 Physical Therapy Weekly Progress Note  Patient Details  Name: Robert Everett MRN: [id number] Date of Birth: 1933-05-13  Beginning of progress report period: May 03, 2024 End of progress report period: May 12, 2024  Today's Date: 05/12/2024 PT Individual Time: 8384-8345 PT Individual Time Calculation (min): 39 min   Patient has met 4 of 4 short term goals.  Robert Everett is making excellent progress towards his LTG of supervision - these have since been upgraded to modified independent given his progress. He is currently completing functional mobility at a SBA to CGA level while using a narrow based QC as an AD. His BERG balance score is 33/56 (1/22) indicating increased falls risk and his most recent TUG was 41 seconds (11/19) indicating increased falls risk. Pt continues to make functional gains but also remains limited by his L sided (UE > LE) weakness and generalized deconditioning.   Patient continues to demonstrate the following deficits muscle weakness and muscle joint tightness, decreased cardiorespiratoy endurance, impaired timing and sequencing, abnormal tone, and unbalanced muscle activation, and decreased standing balance, decreased postural control, hemiplegia, and decreased balance strategies and therefore will continue to benefit from skilled PT intervention to increase functional independence with mobility.  Patient progressing toward long term goals..  Continue plan of care.  PT Short Term Goals Week 1:  PT Short Term Goal 1 (Week 1): Pt will complete bed mobility with minA PT Short Term Goal 1 - Progress (Week 1): Met PT Short Term Goal 2 (Week 1): Pt will complete bed<>chair transfers with minA PT Short Term Goal 2 - Progress (Week 1): Met PT Short Term Goal 3 (Week 1): Pt will ambulate 150' with minA and LRAD PT Short Term Goal 3 - Progress (Week 1): Met PT Short Term Goal 4 (Week 1): Pt will improve TUG by at least 8 seconds PT Short Term Goal 4 - Progress (Week  1): Met Week 2:  PT Short Term Goal 1 (Week 2): STG = LTG due to ELOS  Skilled Therapeutic Interventions/Progress Updates:      Pt in bed to start - has no c/o pain but requests to use the bathroom before leaving his room. Pt completes bed mobility supervision assist and ambulates to the bathroom with CGA and no AD. Pt able to lower pants and brief in standing with supervision, continent of bladder void once sitting on toilet. Pt taken to main gym at w/c level, per patient's preference due to fatigue.   Pt assisted onto Nustep with CGA stand pivot transfer from w/c - assist setup for his L side and ace wrapped his LUE to handle to help with AAROM and sustained grip. Pt completed 15 minutes with L3 resistance, achieving 995 steps in total.   Pt returned to his room via w/c and completed ambulatory transfer at supervision level from w/c to bed. Bed mobility completed at supervision. Left with his needs met, call bell in lap.    Therapy Documentation Precautions:  Precautions Precautions: Fall Precaution/Restrictions Comments: hard of hearing Restrictions Weight Bearing Restrictions Per Provider Order: No General:      Therapy/Group: Individual Therapy  Robert Everett 05/12/2024, 7:39 AM

## 2024-05-12 NOTE — Progress Notes (Signed)
 Occupational Therapy Session Note  Patient Details  Name: Robert Everett MRN: [id number] Date of Birth: 01/01/1934  Today's Date: 05/12/2024 OT Individual Time: 9054-8941 OT Individual Time Calculation (min): 73 min    Short Term Goals: Week 2:  OT Short Term Goal 1 (Week 2): STG=LTG due to LOS  Skilled Therapeutic Interventions/Progress Updates:   Patient received seated in recliner.  Patient reports fatigue from earlier sessions.  Patient walked to main gym with facilitation and cueing for upright posture and eyes forward.  Patient shared that he has double vision when he looks up to right from a prior fall from a ladder with facial fractures.   Worked on isolated control of shoulder, elbow, forearm, wrist and hand.   Discussed pool therapy as an option once he is in OP therapy.   Patient now able to grasp and release small lightweight objects and is beginning to show inter-limb coordination.   Patient returned to room at end of session and left up in recliner with safety belt in place and engaged and call bell/ personal items in reach.      Therapy Documentation Precautions:  Precautions Precautions: Fall Precaution/Restrictions Comments: hard of hearing Restrictions Weight Bearing Restrictions Per Provider Order: No   Pain:  Denies pain     Therapy/Group: Individual Therapy  [name] M 05/12/2024, 12:46 PM

## 2024-05-12 NOTE — Progress Notes (Signed)
 Occupational Therapy Session Note  Patient Details  Name: Robert Everett MRN: [id number] Date of Birth: [birth year]  Today's Date: 05/12/2024 OT Individual Time: 9191-9083 OT Individual Time Calculation (min): 68 min    Short Term Goals: Week 1:  OT Short Term Goal 1 (Week 1): Patient will pull pants down for toileting with increased time and contact guard assist OT Short Term Goal 1 - Progress (Week 1): Progressing toward goal OT Short Term Goal 2 (Week 1): Patient will push left arm nto sleeve of shirt with min cueing OT Short Term Goal 2 - Progress (Week 1): Met OT Short Term Goal 3 (Week 1): Patient will transfer to/from shower with min assist OT Short Term Goal 3 - Progress (Week 1): Met OT Short Term Goal 4 (Week 1): Patient will position left arm while up in bed or in wheelchair with min cueing OT Short Term Goal 4 - Progress (Week 1): Met Week 2:  OT Short Term Goal 1 (Week 2): STG=LTG due to LOS   Skilled Therapeutic Interventions/Progress Updates:    Pt bed level at time of session, no pain. Pt requesting to work on UE instead of ADL/shower. Supine > sit Supervision and ambulated in room with quad cane with CGA fading to close supervision at times. Toileting assist only to don/doff brief 2/2 fit pt able to perform hygiene, thread new pants for LB dressing, and OT assisting with rolling pants/tying 2/2 large fit on his frame. Wheelchair transport room <> gym for time. Set up on UBE level 1 for 5 minutes with L hand wrapped to stabilize. Stand pivot wheelchair <> mat with CGA and focused on seated LUE strengthening and GMC/FMC with 1# dowel curls, presses, overhead press, etc. With distal support. Dynamic seated tasks with BUE tasks reaching/grasping and throwing with ball in various planes and heights. Back in room set up alarm on call bell in reach.   Therapy Documentation Precautions:  Precautions Precautions: Fall Precaution/Restrictions Comments: hard of  hearing Restrictions Weight Bearing Restrictions Per Provider Order: No    Therapy/Group: Individual Therapy  Chiquita JAYSON Hopping 05/12/2024, 7:22 AM

## 2024-05-12 NOTE — Progress Notes (Signed)
 "                                                        PROGRESS NOTE   Subjective/Complaints: No new complaints this morning Feels fatigued from working with therapy LUE strength continues to improve VSS  ROS: Per HPI above.  +left sided weakness, +low back and neck pain, +nocturia- improved a little with flomax , moving bowels regularly- LBM 1/19, +earwax in ears- improved   Objective:   No results found.  Recent Labs    05/10/24 0517 05/11/24 0555  WBC 6.4 6.9  HGB 12.4* 12.8*  HCT 36.0* 36.9*  PLT 221 222   Recent Labs    05/11/24 0555  NA 137  K 4.7  CL 104  CO2 26  GLUCOSE 112*  BUN 25*  CREATININE 1.07  CALCIUM  9.2     Intake/Output Summary (Last 24 hours) at 05/12/2024 1119 Last data filed at 05/12/2024 0615 Gross per 24 hour  Intake 118 ml  Output 725 ml  Net -607 ml        Physical Exam: Vital Signs Blood pressure (!) 136/49, pulse 62, temperature 97.7 F (36.5 C), temperature source Oral, resp. rate 18, height 5' 10 (1.778 m), weight 68 kg, SpO2 97%. Gen: no distress, normal appearing.  Sitting upright in bed. HEENT: oral mucosa pink and moist, NCAT Cardio: Bradycardia Chest: normal effort, normal rate of breathing Abd: soft, non-distended Ext: no edema Psych: pleasant, normal affect Skin: intact Neuro: Alert and oriented x3,  LUE strength is 3/5 in EF and EE,  3/5 FF, FA LLE is 3/5 throughout, stable 1/23 sensation is intact.  Hard of hearing   MSK: Mild tenderness to palpation throughout bilateral cervical paraspinals, levator scapula, splenius capitis, and trapezius muscles.  Also, lumbar paraspinals unchanged.    Assessment/Plan: 1. Functional deficits which require 3+ hours per day of interdisciplinary therapy in a comprehensive inpatient rehab setting. Physiatrist is providing close team supervision and 24 hour management of active medical problems listed below. Physiatrist and rehab team continue to assess barriers to  discharge/monitor patient progress toward functional and medical goals  Care Tool:  Bathing    Body parts bathed by patient: Left arm, Chest, Abdomen, Front perineal area, Buttocks, Right upper leg, Left upper leg, Face, Right lower leg   Body parts bathed by helper: Right arm, Left lower leg     Bathing assist Assist Level: Minimal Assistance - Patient > 75%     Upper Body Dressing/Undressing Upper body dressing   What is the patient wearing?: Pull over shirt    Upper body assist Assist Level: Moderate Assistance - Patient 50 - 74%    Lower Body Dressing/Undressing Lower body dressing      What is the patient wearing?: Pants, Incontinence brief     Lower body assist Assist for lower body dressing: Moderate Assistance - Patient 50 - 74%     Toileting Toileting Toileting Activity did not occur (Clothing management and hygiene only): N/A (no void or bm)  Toileting assist Assist for toileting: Moderate Assistance - Patient 50 - 74%     Transfers Chair/bed transfer  Transfers assist     Chair/bed transfer assist level: Contact Guard/Touching assist     Locomotion Ambulation   Ambulation assist      Assist level: Contact  Guard/Touching assist Assistive device: Cane-quad Max distance: 150   Walk 10 feet activity   Assist     Assist level: Contact Guard/Touching assist Assistive device: Cane-quad   Walk 50 feet activity   Assist    Assist level: Contact Guard/Touching assist Assistive device: Cane-quad    Walk 150 feet activity   Assist    Assist level: Contact Guard/Touching assist Assistive device: Cane-quad    Walk 10 feet on uneven surface  activity   Assist Walk 10 feet on uneven surfaces activity did not occur: Safety/medical concerns   Assist level: Contact Guard/Touching assist     Wheelchair     Assist Is the patient using a wheelchair?: Yes Type of Wheelchair: Manual    Wheelchair assist level: Minimal  Assistance - Patient > 75% Max wheelchair distance: 48'    Wheelchair 50 feet with 2 turns activity    Assist        Assist Level: Moderate Assistance - Patient 50 - 74%   Wheelchair 150 feet activity     Assist      Assist Level: Total Assistance - Patient < 25%   Blood pressure (!) 136/49, pulse 62, temperature 97.7 F (36.5 C), temperature source Oral, resp. rate 18, height 5' 10 (1.778 m), weight 68 kg, SpO2 97%.  Medical Problem List and Plan: 1. 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  Chronic LLE DVT/ Antithrombotics: -DVT/anticoagulation:  Pharmaceutical: continue Lovenox , stool occult ordered since Hgb is decreasing             -antiplatelet therapy: DAPT X 3 weeks followed by Plavix  alone.   3. Chronic LBP/ Pain Management: Tylenol  prn. Kpad ordered- asked nursing to please get him new one as current is not working  - 1-17: Voltaren  gel 4 times daily to shoulders and back.  Has as needed muscle rub.    4. Mood/Behavior/Sleep: LCSW to follow for evaluation and support             --Melatonin prn for insomnia.              -antipsychotic agents: N/A  5. Neuropsych/cognition: This patient is capable of making decisions on his own behalf.  6. Constipation: LBM 1/21- messaged nursing to confirm accuracy, asked nursing to please offer him prune juice, magnesium  oxide added HS  7. Diastolic hypotension: continue to monitor BP TID  8.  Thrombocytopenia: reviewed and has resolved  9. Moderate to severe aortic regurgitation: BC X 2 negative so far. S/Everett TEE. Reviewed with patient and family management of PFO, outpatient follow-up  10. Dyslipidemia: continue Liptor @ 40 mg.   11. BPH: decreased flomax  to 0.4mg  as per patient's request, continue this dose  12. Systolic HTN: decrease flomax  to 0.4mg  improved, continue  this dose    LOS: 10 days A FACE TO FACE EVALUATION WAS PERFORMED  Robert Everett Robert Everett Marvin 05/12/2024, 11:19 AM     "

## 2024-05-12 NOTE — Progress Notes (Signed)
 Patient states they have been to the [facility] and urinated at twice that is not documented, they state it wasn't nurse or tech that took them to the [facility], they believe it was therapy

## 2024-05-13 DIAGNOSIS — K5900 Constipation, unspecified: Secondary | ICD-10-CM

## 2024-05-13 DIAGNOSIS — N400 Enlarged prostate without lower urinary tract symptoms: Secondary | ICD-10-CM

## 2024-05-13 DIAGNOSIS — Z23 Encounter for immunization: Secondary | ICD-10-CM

## 2024-05-13 MED ORDER — INFLUENZA VAC SPLIT HIGH-DOSE 0.5 ML IM SUSY
0.5000 mL | PREFILLED_SYRINGE | INTRAMUSCULAR | Status: AC
  Administered 2024-05-14: 0.5 mL via INTRAMUSCULAR
  Filled 2024-05-13: qty 0.5

## 2024-05-13 NOTE — Progress Notes (Signed)
 Physical Therapy Session Note  Patient Details  Name: Robert Everett MRN: [id number] Date of Birth: [birth year]  Today's Date: 05/13/2024 PT Individual Time: 8399-8345 PT Individual Time Calculation (min): 54 min   Short Term Goals: Week 2:  PT Short Term Goal 1 (Week 2): STG = LTG due to ELOS  Skilled Therapeutic Interventions/Progress Updates:      Pt presents in bed to start - sleeping but awakens to voice. Pt in agreement to therapy treatment, with no reports of pain.   Supine<>sitting EOB at supervision level. Offered patient to shower but patient declining offer. Stand pivot transfer with supervision from bed to w/c.   Transported patient at w/c level to day room gym. Completed functional ambulation 2x165' with QC at [facility] to supervision level. Slow and steady gait, minimal reliance of QC for balance and stability. Seated rest break needed given fatigue level.   Pt noted to have strong smell of urine - encouraged patient to go to the bathroom for toileting and to change brief. Brief heavily saturated of urine and pt continent of bladder void once positioned on the toilet. Both continence and incontinence were charted.   Pt completed repeated step ups with L foot leading, 3# ankle weight to LLE. MinA for balance. 2x20 with seated rest breaks.   TUG completed x3 trials: 1) 30 seconds 2) 32 seconds 3) 23 seconds AVG = 28 seconds *scored 41 seconds on 1/19. *scores > 13.5 seconds indicate increased falls risk.   Pt finished session seated stepper (kinetron) with resistance set to 75cm/sec. Encouraged full AROM on his L side which he sustained for the duration of activity, x8 minutes.   Pt returned to his room at conclusion of session, assisted to bed to rest. Left with his needs met and call bell in lap.   Therapy Documentation Precautions:  Precautions Precautions: Fall Precaution/Restrictions Comments: hard of hearing Restrictions Weight Bearing Restrictions Per Provider  Order: No General:      Therapy/Group: Individual Therapy  [name] 05/13/2024, 6:53 AM

## 2024-05-13 NOTE — Progress Notes (Signed)
 "                                                        PROGRESS NOTE   Subjective/Complaints: Asking about flu shot, he would like to get this.  No additional complaints.   ROS: Per HPI above.  Denies CP, SOB,abdominal pain, nausea, chills  +left sided weakness, +low back and neck pain, +nocturia- improved a little with flomax , moving bowels regularly- LBM 1/19, +earwax in ears- improved   Objective:   No results found.  Recent Labs    05/11/24 0555  WBC 6.9  HGB 12.8*  HCT 36.9*  PLT 222   Recent Labs    05/11/24 0555  NA 137  K 4.7  CL 104  CO2 26  GLUCOSE 112*  BUN 25*  CREATININE 1.07  CALCIUM  9.2     Intake/Output Summary (Last 24 hours) at 05/13/2024 1231 Last data filed at 05/13/2024 0955 Gross per 24 hour  Intake 590 ml  Output 600 ml  Net -10 ml        Physical Exam: Vital Signs Blood pressure (!) 129/46, pulse 62, temperature 97.8 F (36.6 C), temperature source Oral, resp. rate 18, height 5' 10 (1.778 m), weight 68 kg, SpO2 98%. Gen: no distress, normal appearing.  Sitting in WC, working with therapy in his room HEENT: oral mucosa pink and moist, NCAT Cardio: RRR Chest: CTAB, normal effort, normal rate of breathing Abd: soft, non-distended Ext: no edema Psych: pleasant, normal affect Skin: intact Neuro: Alert and oriented x3,  LUE strength is 3/5 in EF and EE,  3/5 FF, FA LLE is 3/5 throughout, stable 1/24 sensation is intact.  Hard of hearing   MSK: Mild tenderness to palpation throughout bilateral cervical paraspinals, levator scapula, splenius capitis, and trapezius muscles.  Also, lumbar paraspinals unchanged.    Assessment/Plan: 1. Functional deficits which require 3+ hours per day of interdisciplinary therapy in a comprehensive inpatient rehab setting. Physiatrist is providing close team supervision and 24 hour management of active medical problems listed below. Physiatrist and rehab team continue to assess barriers to  discharge/monitor patient progress toward functional and medical goals  Care Tool:  Bathing    Body parts bathed by patient: Left arm, Chest, Abdomen, Front perineal area, Buttocks, Right upper leg, Left upper leg, Face, Right lower leg   Body parts bathed by helper: Right arm, Left lower leg     Bathing assist Assist Level: Minimal Assistance - Patient > 75%     Upper Body Dressing/Undressing Upper body dressing   What is the patient wearing?: Pull over shirt    Upper body assist Assist Level: Moderate Assistance - Patient 50 - 74%    Lower Body Dressing/Undressing Lower body dressing      What is the patient wearing?: Pants     Lower body assist Assist for lower body dressing: Minimal Assistance - Patient > 75%     Toileting Toileting Toileting Activity did not occur (Clothing management and hygiene only): N/A (no void or bm)  Toileting assist Assist for toileting: Moderate Assistance - Patient 50 - 74%     Transfers Chair/bed transfer  Transfers assist     Chair/bed transfer assist level: Contact Guard/Touching assist     Locomotion Ambulation   Ambulation assist      Assist  level: Contact Guard/Touching assist Assistive device: Cane-quad Max distance: 150   Walk 10 feet activity   Assist     Assist level: Contact Guard/Touching assist Assistive device: Cane-quad   Walk 50 feet activity   Assist    Assist level: Contact Guard/Touching assist Assistive device: Cane-quad    Walk 150 feet activity   Assist    Assist level: Contact Guard/Touching assist Assistive device: Cane-quad    Walk 10 feet on uneven surface  activity   Assist Walk 10 feet on uneven surfaces activity did not occur: Safety/medical concerns   Assist level: Contact Guard/Touching assist     Wheelchair     Assist Is the patient using a wheelchair?: Yes Type of Wheelchair: Manual    Wheelchair assist level: Minimal Assistance - Patient > 75% Max  wheelchair distance: 62'    Wheelchair 50 feet with 2 turns activity    Assist        Assist Level: Moderate Assistance - Patient 50 - 74%   Wheelchair 150 feet activity     Assist      Assist Level: Total Assistance - Patient < 25%   Blood pressure (!) 129/46, pulse 62, temperature 97.8 F (36.6 C), temperature source Oral, resp. rate 18, height 5' 10 (1.778 m), weight 68 kg, SpO2 98%.  Medical Problem List and Plan: 1. 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  Chronic LLE DVT/ Antithrombotics: -DVT/anticoagulation:  Pharmaceutical: continue Lovenox , stool occult ordered since Hgb is decreasing             -antiplatelet therapy: DAPT X 3 weeks followed by Plavix  alone.   3. Chronic LBP/ Pain Management: Tylenol  prn. Kpad ordered- asked nursing to please get him new one as current is not working  - 1-17: Voltaren  gel 4 times daily to shoulders and back.  Has as needed muscle rub.    4. Mood/Behavior/Sleep: LCSW to follow for evaluation and support             --Melatonin prn for insomnia.              -antipsychotic agents: N/A  5. Neuropsych/cognition: This patient is capable of making decisions on his own behalf.  6. Constipation: LBM 1/21- messaged nursing to confirm accuracy, asked nursing to please offer him prune juice, magnesium  oxide added HS  -1/24 LBM today, improved  7. Diastolic hypotension: continue to monitor BP TID  8.  Thrombocytopenia: reviewed and has resolved  9. Moderate to severe aortic regurgitation: BC X 2 negative so far. S/p TEE. Reviewed with patient and family management of PFO, outpatient follow-up  10. Dyslipidemia: continue Liptor @ 40 mg.   11. BPH: decreased flomax  to 0.4mg  as per patient's request, continue this dose  -1/24 PVR 0 yesterday, continent, stable  12. Systolic HTN:  decrease flomax  to 0.4mg  improved, continue this dose  -1/24, fair control, monitor trend    05/13/2024    3:43 AM 05/12/2024    7:51 PM 05/12/2024    2:54 PM  Vitals with BMI  Systolic 129 150 841  Diastolic 46 45 46  Pulse 62 66 87    13. Health maintenance  -1/24 flu shot ordered per patient request, no allergies    LOS:  11 days A FACE TO FACE EVALUATION WAS PERFORMED  Murray Collier 05/13/2024, 12:31 PM     "

## 2024-05-13 NOTE — Progress Notes (Signed)
 Occupational Therapy Session Note  Patient Details  Name: Robert Everett MRN: [id number] Date of Birth: 09/13/1933  Today's Date: 05/13/2024 OT Individual Time: 1100-1200 OT Individual Time Calculation (min): 60 min    Short Term Goals: Week 1:  OT Short Term Goal 1 (Week 1): Patient will pull pants down for toileting with increased time and contact guard assist OT Short Term Goal 1 - Progress (Week 1): Progressing toward goal OT Short Term Goal 2 (Week 1): Patient will push left arm nto sleeve of shirt with min cueing OT Short Term Goal 2 - Progress (Week 1): Met OT Short Term Goal 3 (Week 1): Patient will transfer to/from shower with min assist OT Short Term Goal 3 - Progress (Week 1): Met OT Short Term Goal 4 (Week 1): Patient will position left arm while up in bed or in wheelchair with min cueing OT Short Term Goal 4 - Progress (Week 1): Met  Skilled Therapeutic Interventions/Progress Updates:     Initial Encounter:  Patient resting in bed at the time of arrival , indicating that he rested well during night. The pt went to say that he had no pain to report. The pt was in agreement with working on NMR of the LUE to improve comunication for gains in functional mobility.  Funcitonal Transfers: The pt was able to come from supine in bed to EOB with SBA, the pt was able to transfer from EOB to standing using grab bar and the arm of the w/c for placement at w/c LOF.  UB Exercise:  The pt went on to complete manual manipulation of the LUE beginning at the scapular ,neck, shld, arm , forearm, wrist and hand. The pt was able to follow up with an resistive exercise with his feet support.  The pt was able to grasp  a 2lb dowel and bring it into shld flexion with focus on maintaining balance with the dowel 7x for a count of 10  The pt went on to complete UB cycling with rest breaks as needed while incorporating his sight for attention to grasp and hand placement with the LUE.   At the end on the  session, the pt was transported to the bed and was able to transfer from w/c LOF to standing using the bed rail  for standing balance. The pt was able to go from EOB to supine with SBA.  The pt was able to go up in bed using the bed rails with MinA. The call light and all additional needs were addressed prior to me exiting the room.  Therapy Documentation Precautions:  Precautions Precautions: Fall Precaution/Restrictions Comments: hard of hearing Restrictions Weight Bearing Restrictions Per Provider Order: No  Therapy/Group: Individual Therapy  Elvera JONETTA Mace 05/13/2024, 4:43 PM

## 2024-05-14 NOTE — Progress Notes (Signed)
 "                                                        PROGRESS NOTE   Subjective/Complaints: No new complaints this morning.  Feels like his left arm is getting stronger.  ROS: Per HPI above.  Denies fever, CP, SOB,abdominal pain, nausea Denies any motor or sensory changes  +left sided weakness, +low back and neck pain, +nocturia- improved a little with flomax , +earwax in ears- improved   Objective:   No results found.  No results for input(s): WBC, HGB, HCT, PLT in the last 72 hours.  No results for input(s): NA, K, CL, CO2, GLUCOSE, BUN, CREATININE, CALCIUM  in the last 72 hours.    Intake/Output Summary (Last 24 hours) at 05/14/2024 1229 Last data filed at 05/14/2024 1115 Gross per 24 hour  Intake 812 ml  Output 1475 ml  Net -663 ml        Physical Exam: Vital Signs Blood pressure (!) 132/42, pulse (!) 58, temperature 97.7 F (36.5 C), temperature source Oral, resp. rate 18, height 5' 10 (1.778 m), weight 68 kg, SpO2 97%. Gen: no distress, normal appearing.  Sitting in WC, appears comfortable HEENT: oral mucosa pink and moist, NCAT Cardio: RRR Chest: CTAB, normal effort, normal rate of breathing Abd: soft, non-distended, positive bowel sounds Ext: no edema Psych: pleasant, normal affect Skin: intact Neuro: Alert and oriented x3,  Able to lift left upper extremity to gravity LLE is 3/5 throughout sensation is intact.  Hard of hearing   MSK: Mild tenderness to palpation throughout bilateral cervical paraspinals, levator scapula, splenius capitis, and trapezius muscles.  Also, lumbar paraspinals unchanged.    Assessment/Plan: 1. Functional deficits which require 3+ hours per day of interdisciplinary therapy in a comprehensive inpatient rehab setting. Physiatrist is providing close team supervision and 24 hour management of active medical problems listed below. Physiatrist and rehab team continue to assess barriers to discharge/monitor  patient progress toward functional and medical goals  Care Tool:  Bathing    Body parts bathed by patient: Left arm, Chest, Abdomen, Front perineal area, Buttocks, Right upper leg, Left upper leg, Face, Right lower leg   Body parts bathed by helper: Right arm, Left lower leg     Bathing assist Assist Level: Minimal Assistance - Patient > 75%     Upper Body Dressing/Undressing Upper body dressing   What is the patient wearing?: Pull over shirt    Upper body assist Assist Level: Moderate Assistance - Patient 50 - 74%    Lower Body Dressing/Undressing Lower body dressing      What is the patient wearing?: Pants     Lower body assist Assist for lower body dressing: Minimal Assistance - Patient > 75%     Toileting Toileting Toileting Activity did not occur (Clothing management and hygiene only): N/A (no void or bm)  Toileting assist Assist for toileting: Moderate Assistance - Patient 50 - 74%     Transfers Chair/bed transfer  Transfers assist     Chair/bed transfer assist level: Contact Guard/Touching assist     Locomotion Ambulation   Ambulation assist      Assist level: Contact Guard/Touching assist Assistive device: Cane-quad Max distance: 150   Walk 10 feet activity   Assist     Assist level: Contact Guard/Touching assist  Assistive device: Cane-quad   Walk 50 feet activity   Assist    Assist level: Contact Guard/Touching assist Assistive device: Cane-quad    Walk 150 feet activity   Assist    Assist level: Contact Guard/Touching assist Assistive device: Cane-quad    Walk 10 feet on uneven surface  activity   Assist Walk 10 feet on uneven surfaces activity did not occur: Safety/medical concerns   Assist level: Contact Guard/Touching assist     Wheelchair     Assist Is the patient using a wheelchair?: Yes Type of Wheelchair: Manual    Wheelchair assist level: Minimal Assistance - Patient > 75% Max wheelchair  distance: 63'    Wheelchair 50 feet with 2 turns activity    Assist        Assist Level: Moderate Assistance - Patient 50 - 74%   Wheelchair 150 feet activity     Assist      Assist Level: Total Assistance - Patient < 25%   Blood pressure (!) 132/42, pulse (!) 58, temperature 97.7 F (36.5 C), temperature source Oral, resp. rate 18, height 5' 10 (1.778 m), weight 68 kg, SpO2 97%.  Medical Problem List and Plan: 1. 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  Chronic LLE DVT/ Antithrombotics: -DVT/anticoagulation:  Pharmaceutical: continue Lovenox , stool occult ordered since Hgb is decreasing             -antiplatelet therapy: DAPT X 3 weeks followed by Plavix  alone.   3. Chronic LBP/ Pain Management: Tylenol  prn. Kpad ordered- asked nursing to please get him new one as current is not working  - 1-17: Voltaren  gel 4 times daily to shoulders and back.  Has as needed muscle rub.    4. Mood/Behavior/Sleep: LCSW to follow for evaluation and support             --Melatonin prn for insomnia.              -antipsychotic agents: N/A  5. Neuropsych/cognition: This patient is capable of making decisions on his own behalf.  6. Constipation: LBM 1/21- messaged nursing to confirm accuracy, asked nursing to please offer him prune juice, magnesium  oxide added HS  -1/25 LBM yesterday continue to monitor  7. Diastolic hypotension: continue to monitor BP TID  8.  Thrombocytopenia: reviewed and has resolved  9. Moderate to severe aortic regurgitation: BC X 2 negative so far. S/p TEE. Reviewed with patient and family management of PFO, outpatient follow-up  10. Dyslipidemia: continue Liptor @ 40 mg.   11. BPH: decreased flomax  to 0.4mg  as per patient's request, continue this dose  -1/24 PVR 0 yesterday, continent, stable  -1/25 continent  continue to monitor  12. Systolic HTN: decrease flomax  to 0.4mg  improved, continue this dose  -1/24-25, fair control, monitor trend    05/14/2024    4:24 AM 05/13/2024    7:42 PM 05/13/2024    3:08 PM  Vitals with BMI  Systolic 132 147 851  Diastolic 42 47 67  Pulse 58 69 71    13. Health maintenance  -1/24 flu shot ordered per patient request, no allergies   - Flu shot scheduled for today  LOS: 12 days A FACE TO FACE EVALUATION WAS PERFORMED  Murray Collier 05/14/2024,  12:29 PM     "

## 2024-05-15 LAB — CBC
HCT: 37.8 % — ABNORMAL LOW (ref 39.0–52.0)
Hemoglobin: 13 g/dL (ref 13.0–17.0)
MCH: 33.4 pg (ref 26.0–34.0)
MCHC: 34.4 g/dL (ref 30.0–36.0)
MCV: 97.2 fL (ref 80.0–100.0)
Platelets: 198 10*3/uL (ref 150–400)
RBC: 3.89 MIL/uL — ABNORMAL LOW (ref 4.22–5.81)
RDW: 13.2 % (ref 11.5–15.5)
WBC: 7.9 10*3/uL (ref 4.0–10.5)
nRBC: 0 % (ref 0.0–0.2)

## 2024-05-15 LAB — BASIC METABOLIC PANEL WITH GFR
Anion gap: 7 (ref 5–15)
BUN: 26 mg/dL — ABNORMAL HIGH (ref 8–23)
CO2: 26 mmol/L (ref 22–32)
Calcium: 9.1 mg/dL (ref 8.9–10.3)
Chloride: 106 mmol/L (ref 98–111)
Creatinine, Ser: 1.1 mg/dL (ref 0.61–1.24)
GFR, Estimated: >60 mL/min
Glucose, Bld: 102 mg/dL — ABNORMAL HIGH (ref 70–99)
Potassium: 4.7 mmol/L (ref 3.5–5.1)
Sodium: 138 mmol/L (ref 135–145)

## 2024-05-15 NOTE — Progress Notes (Signed)
 Occupational Therapy Session Note  Patient Details  Name: Robert Everett MRN: [id number] Date of Birth: 01-03-1934  Today's Date: 05/15/2024 OT Individual Time: 1400-1451 OT Individual Time Calculation (min): 51 min    Short Term Goals: Week 2:  OT Short Term Goal 1 (Week 2): STG=LTG due to LOS  Skilled Therapeutic Interventions/Progress Updates:   Patient received supine in bed, awake, alert, and agreeable to OT therapy.  Patient hesitant to walk to gym as he has had minimal opportunities to move over the weekend.  Patient needing cueing to effectively don pants while seated in wheelchair.  Patient opting to be pushed in wheelchair to gym.   Worked on hand and arm movement and function in gym. Patient better able to use thumb to oppose index finger to begin to pick up and release smaller object - e.g. pen, playing cards, coins.  Patient still needs  cueing to attempt to use hand in natural functional conditions on occasion.   Walked back to room with CGA to supervision intermittently using SBQC.  Patient indicated need to void when asked and had continent void on toilet.  Assisted back to bed.  Bed alarm engaged and call bell/ personal items in reach.     Therapy Documentation Precautions:  Precautions Precautions: Fall Precaution/Restrictions Comments: hard of hearing Restrictions Weight Bearing Restrictions Per Provider Order: No  Pain:  Denies pain   Therapy/Group: Individual Therapy  Jenesys Casseus M 05/15/2024, 2:52 PM

## 2024-05-15 NOTE — Progress Notes (Signed)
 Physical Therapy Note  Patient Details  Name: Robert Everett MRN: [id number] Date of Birth: 06-03-1933 Today's Date: 05/15/2024    Due to the [place]  state of emergency, patients may not receive 3.0 hours of therapy services.   [name] 05/15/2024, 10:59 AM

## 2024-05-15 NOTE — Progress Notes (Signed)
 Speech Language Pathology Weekly Progress and Session Note  Patient Details  Name: Robert Everett MRN: [id number] Date of Birth: Jun 17, 1933  Beginning of progress report period: May 03, 2024 End of progress report period: May 15, 2024  Today's Date: 05/15/2024 SLP Individual Time: 1100-1155 SLP Individual Time Calculation (min): 55 min  Short Term Goals: Week 1: SLP Short Term Goal 1 (Week 1): Patient will recall cognitive and physical changes with min verbal A SLP Short Term Goal 1 - Progress (Week 1): Met SLP Short Term Goal 2 (Week 1): Patient will demonstrate problem solving skills in functional situations given min mulitmodal A SLP Short Term Goal 2 - Progress (Week 1): Met  New Short Term Goals: Week 2: SLP Short Term Goal 1 (Week 2): STGs=LTGs d/t ELOS  Weekly Progress Updates: Patient has made excellent progress towards therapy goals, meeting 2/2 short term goals set this reporting period. Patient benefits from supervision-min assist for recall of physical and cognitive changes as well as problem solving during functional situations. Patient and family education ongoing. Patient will continue to benefit from skilled therapy services during remainder of CIR stay.     Intensity: Minumum of 1-2 x/day, 30 to 90 minutes Frequency: 1 to 3 out of 7 days Duration/Length of Stay: 1/28 Treatment/Interventions: Cognitive remediation/compensation;Cueing hierarchy;Internal/external aids;Patient/family education;Therapeutic Activities  Daily Session  Skilled Therapeutic Interventions: SLP conducted skilled therapy session targeting cognitive goals. Patient solved mildly complex math-based problems in written format with min to mod assist with increased need for assistance likely due to increased problem complexity. Patient added basic sums during money management task, providing total of bills and coins added together with overall min assist fading to supervision. Patient endorses  that prior to admission, he was not great at math, however discussed importance of being able to complete basic money math due to implications in daily life. Patient was left in room with call bell in reach and alarm set. SLP will continue to target goals per plan of care.        Pain Pain Assessment Pain Scale: 0-10 Pain Score: 0-No painNone endorsed  Therapy/Group: Individual Therapy  Robert Everett, M.A., CCC-SLP  Robert Everett A Robert Everett 05/15/2024, 12:28 PM

## 2024-05-15 NOTE — Progress Notes (Signed)
 Patient ID: Robert Everett, male   DOB: [birth year], 89 y.o.   MRN: [id number]  [facility] PT/OT with Leopoldo

## 2024-05-15 NOTE — Progress Notes (Signed)
 "                                                        PROGRESS NOTE   Subjective/Complaints: No new complaints this morning LUE strength continues to improve Patient's chart reviewed- No issues reported overnight Vitals signs stable   ROS: Per HPI above.  Denies fever, CP, SOB,abdominal pain, nausea Denies any motor or sensory changes  +left sided weakness, +low back and neck pain, +nocturia- improved a little with flomax , +earwax in ears- improved   Objective:   No results found.  Recent Labs    05/15/24 0435  WBC 7.9  HGB 13.0  HCT 37.8*  PLT 198    Recent Labs    05/15/24 0435  NA 138  K 4.7  CL 106  CO2 26  GLUCOSE 102*  BUN 26*  CREATININE 1.10  CALCIUM  9.1      Intake/Output Summary (Last 24 hours) at 05/15/2024 0949 Last data filed at 05/15/2024 0810 Gross per 24 hour  Intake 653 ml  Output 1650 ml  Net -997 ml        Physical Exam: Vital Signs Blood pressure (!) 139/49, pulse (!) 59, temperature 97.7 F (36.5 C), temperature source Oral, resp. rate 18, height 5' 10 (1.778 m), weight 68 kg, SpO2 93%. Gen: no distress, normal appearing.  Sitting in WC, appears comfortable HEENT: oral mucosa pink and moist, NCAT Cardio: Bradycardic Chest: CTAB, normal effort, normal rate of breathing Abd: soft, non-distended, positive bowel sounds Ext: no edema Psych: pleasant, normal affect Skin: intact Neuro: Alert and oriented x3,  Able to lift left upper extremity to gravity LLE is 3/5 throughout sensation is intact.  Hard of hearing  MSK: Mild tenderness to palpation throughout bilateral cervical paraspinals, levator scapula, splenius capitis, and trapezius muscles.  Also, lumbar paraspinals unchanged.    Assessment/Plan: 1. Functional deficits which require 3+ hours per day of interdisciplinary therapy in a comprehensive inpatient rehab setting. Physiatrist is providing close team supervision and 24 hour management of active medical problems  listed below. Physiatrist and rehab team continue to assess barriers to discharge/monitor patient progress toward functional and medical goals  Care Tool:  Bathing    Body parts bathed by patient: Left arm, Chest, Abdomen, Front perineal area, Buttocks, Right upper leg, Left upper leg, Face, Right lower leg   Body parts bathed by helper: Right arm, Left lower leg     Bathing assist Assist Level: Minimal Assistance - Patient > 75%     Upper Body Dressing/Undressing Upper body dressing   What is the patient wearing?: Pull over shirt    Upper body assist Assist Level: Moderate Assistance - Patient 50 - 74%    Lower Body Dressing/Undressing Lower body dressing      What is the patient wearing?: Pants     Lower body assist Assist for lower body dressing: Minimal Assistance - Patient > 75%     Toileting Toileting Toileting Activity did not occur (Clothing management and hygiene only): N/A (no void or bm)  Toileting assist Assist for toileting: Moderate Assistance - Patient 50 - 74%     Transfers Chair/bed transfer  Transfers assist     Chair/bed transfer assist level: Supervision/Verbal cueing     Locomotion Ambulation   Ambulation assist  Assist level: Contact Guard/Touching assist Assistive device: Cane-quad Max distance: 150   Walk 10 feet activity   Assist     Assist level: Contact Guard/Touching assist Assistive device: Cane-quad   Walk 50 feet activity   Assist    Assist level: Contact Guard/Touching assist Assistive device: Cane-quad    Walk 150 feet activity   Assist    Assist level: Contact Guard/Touching assist Assistive device: Cane-quad    Walk 10 feet on uneven surface  activity   Assist Walk 10 feet on uneven surfaces activity did not occur: Safety/medical concerns   Assist level: Contact Guard/Touching assist     Wheelchair     Assist Is the patient using a wheelchair?: Yes Type of Wheelchair: Manual     Wheelchair assist level: Minimal Assistance - Patient > 75% Max wheelchair distance: 45'    Wheelchair 50 feet with 2 turns activity    Assist        Assist Level: Moderate Assistance - Patient 50 - 74%   Wheelchair 150 feet activity     Assist      Assist Level: Total Assistance - Patient < 25%   Blood pressure (!) 139/49, pulse (!) 59, temperature 97.7 F (36.5 C), temperature source Oral, resp. rate 18, height 5' 10 (1.778 m), weight 68 kg, SpO2 93%.  Medical Problem List and Plan: 1. 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  Chronic LLE DVT/ Antithrombotics: -DVT/anticoagulation:  Pharmaceutical: continue Lovenox , hgb reviewed and has improved             -antiplatelet therapy: DAPT X 3 weeks followed by Plavix  alone.   3. Chronic LBP/ Pain Management: Tylenol  prn. Kpad ordered- asked nursing to please get him new one as current is not working. Continue Voltaren  gel 4 times daily to shoulders and back.  Has as needed muscle rub.    4. Mood/Behavior/Sleep: LCSW to follow for evaluation and support             --Melatonin prn for insomnia.              -antipsychotic agents: N/A  5. Neuropsych/cognition: This patient is capable of making decisions on his own behalf.  6. Constipation: LBM 1/25- messaged nursing to confirm accuracy, asked nursing to please offer him prune juice, magnesium  oxide added HS  7. Diastolic hypotension: continue to monitor BP TID  8.  Thrombocytopenia: reviewed and has resolved  9. Moderate to severe aortic regurgitation: BC X 2 negative so far. S/p TEE. Reviewed with patient and family management of PFO, outpatient follow-up  10. Dyslipidemia: continue Liptor @ 40 mg.   11. BPH: continue flomax  to 0.4mg   12. Systolic HTN: continue flomax  0.4mg   LOS: 13 days A FACE TO FACE  EVALUATION WAS PERFORMED  Sven SQUIBB Sai Moura 05/15/2024, 9:49 AM     "

## 2024-05-15 NOTE — Progress Notes (Signed)
 Occupational Therapy Discharge Summary  Patient Details  Name: Robert Everett MRN: [id number] Date of Birth: [birth year]  Date of Discharge from OT service:{Time; dates multiple:304500300}    Patient has met {NUMBERS 0-12:18577} of {NUMBERS 0-12:18577} long term goals due to {due un:6958348}.  Patient to discharge at overall {LOA:3049010} level.  Patient's care partner {care partner:[id number]} to provide the necessary {assistance:3041652} assistance at discharge.    Reasons goals not met: ***  Recommendation:  Patient will benefit from ongoing skilled OT services in outpatient setting to continue to advance functional skills in the area of BADL, iADL, and Reduce care partner burden.  Equipment: BSC, cane  Reasons for discharge: treatment goals met and discharge from hospital  Patient/family agrees with progress made and goals achieved: Yes  OT Discharge Precautions/Restrictions  Precautions Precautions: Fall Recall of Precautions/Restrictions: Intact Restrictions Weight Bearing Restrictions Per Provider Order: No General   Vital Signs Therapy Vitals Temp: (!) 97.4 F (36.3 C) Temp Source: Oral Pulse Rate: 80 Resp: 18 BP: (!) 148/44 Patient Position (if appropriate): Lying Oxygen Therapy SpO2: 94 % O2 Device: Room Air Pain Pain Assessment Pain Score: 0-No pain ADL ADL Eating: Minimal assistance Where Assessed-Eating: Chair Grooming: Setup Where Assessed-Grooming: Sitting at sink Upper Body Bathing: Minimal assistance Where Assessed-Upper Body Bathing: Sitting at sink Lower Body Bathing: Contact guard Where Assessed-Lower Body Bathing: Shower Upper Body Dressing: Contact guard Where Assessed-Upper Body Dressing: Sitting at sink Lower Body Dressing: Minimal assistance Where Assessed-Lower Body Dressing: Sitting at sink, Standing at sink Toileting: Minimal assistance Where Assessed-Toileting: Teacher, Adult Education: Close supervision Toilet Transfer Method:  Ambulating Tub/Shower Transfer: Minimal Radiation Protection Practitioner Method: Ship Broker: Other (comment) (built in seat) Film/video Editor: Close supervision Film/video Editor Method: Designer, Industrial/product: Grab bars, Sales Promotion Account Executive Baseline Vision/History: 1 Wears glasses Patient Visual Report: No change from baseline Vision Assessment?: No apparent visual deficits Perception  Perception: Within Functional Limits Praxis Praxis: WFL Cognition Cognition Overall Cognitive Status: Impaired/Different from baseline Arousal/Alertness: Awake/alert Orientation Level: Person;Place;Situation Person: Oriented Place: Oriented Situation: Oriented Memory Impairment: Retrieval deficit;Storage deficit Attention: Selective Selective Attention: Appears intact Awareness: Appears intact Awareness Impairment: Anticipatory impairment Problem Solving Impairment: Functional basic Safety/Judgment: Appears intact Sensation Sensation Light Touch: Appears Intact Hot/Cold: Appears Intact Proprioception: Appears Intact Stereognosis: Not tested Coordination Gross Motor Movements are Fluid and Coordinated: No Fine Motor Movements are Fluid and Coordinated: No Coordination and Movement Description: left hemiparesis Motor  Motor Motor: Hemiplegia;Motor impersistence;Abnormal postural alignment and control Motor - Discharge Observations: left hemi UE>LE Mobility  Bed Mobility Bed Mobility: Rolling Right;Right Sidelying to Sit Rolling Right: Independent with assistive device Right Sidelying to Sit: Independent with assistive device Transfers Sit to Stand: Independent with assistive device Stand to Sit: Independent with assistive device  Trunk/Postural Assessment  Cervical Assessment Cervical Assessment: Within Functional Limits Thoracic Assessment Thoracic Assessment: Within Functional Limits Lumbar Assessment Lumbar Assessment: Within  Functional Limits Postural Control Trunk Control: tends toward flexed posture Righting Reactions: delayed toward left  Balance Balance Balance Assessed: Yes Static Sitting Balance Static Sitting - Balance Support: Feet supported;No upper extremity supported Static Sitting - Level of Assistance: 7: Independent Dynamic Sitting Balance Dynamic Sitting - Balance Support: Feet supported;No upper extremity supported Dynamic Sitting - Level of Assistance: 7: Independent Static Standing Balance Static Standing - Balance Support: Right upper extremity supported;Left upper extremity supported;During functional activity Static Standing - Level of Assistance: 6: Modified independent (Device/Increase time) Dynamic Standing Balance Dynamic Standing - Balance Support: Right  upper extremity supported;During functional activity;Left upper extremity supported Dynamic Standing - Level of Assistance: 4: Min assist Extremity/Trunk Assessment RUE Assessment RUE Assessment: Within Functional Limits LUE Assessment LUE Assessment: Exceptions to [facility] Active Range of Motion (AROM) Comments: left hemiplegia - has delayed response but improving active movement LUE Body System: Neuro Brunstrum levels for arm and hand: Arm;Hand Brunstrum level for arm: Stage IV Movement is deviating from synergy Brunstrum level for hand: Stage IV Movements deviating from synergies   Chuck Caban M 05/15/2024, 3:44 PM

## 2024-05-15 NOTE — Discharge Instructions (Signed)
 Inpatient Rehab Discharge Instructions  Robert Everett Discharge date and time: 05/17/24   Activities/Precautions/ Functional Status: Activity: no lifting, driving, or strenuous exercise till cleared by MD Diet: low fat, low cholesterol diet Wound Care: none needed   Functional status:  ___ No restrictions     ___ Walk up steps independently _X__ 24/7 supervision/assistance   ___ Walk up steps with assistance ___ Intermittent supervision/assistance  ___ Bathe/dress independently ___ Walk with walker     ___ Bathe/dress with assistance ___ Walk Independently    ___ Shower independently ___ Walk with assistance    _X__ Shower with assistance _X__ No alcohol     ___ Return to work/school ________   Special Instructions:  COMMUNITY REFERRALS UPON DISCHARGE:    Home Health:   PT     OT                      Agency: [facility] Phone: [phone]  *Please expect follow-up within 2-3 business days for discharge to schedule your home visit. If you have not received follow-up, be sure to contact the site directly.*  STROKE/TIA DISCHARGE INSTRUCTIONS SMOKING Cigarette smoking nearly doubles your risk of having a stroke & is the single most alterable risk factor  If you smoke or have smoked in the last 12 months, you are advised to quit smoking for your health. Most of the excess cardiovascular risk related to smoking disappears within a year of stopping. Ask you doctor about anti-smoking medications [place] Quit Line: 1-800-QUIT NOW Free Smoking Cessation Classes [phone]  CHOLESTEROL Know your levels; limit fat & cholesterol in your diet  Lipid Panel     Component Value Date/Time   CHOL 124 04/28/2024 0148   TRIG 49 04/28/2024 0148   HDL 42 04/28/2024 0148   CHOLHDL 3.0 04/28/2024 0148   VLDL 10 04/28/2024 0148   LDLCALC 72 04/28/2024 0148     Many patients benefit from treatment even if their cholesterol is at goal. Goal: Total Cholesterol (CHOL) less than 160 Goal:   Triglycerides (TRIG) less than 150 Goal:  HDL greater than 40 Goal:  LDL (LDLCALC) less than 100   BLOOD PRESSURE American Stroke Association blood pressure target is less that 120/80 mm/Hg  Your discharge blood pressure is:  BP: (!) 127/44 Monitor your blood pressure Limit your salt and alcohol intake Many individuals will require more than one medication for high blood pressure  DIABETES (A1c is a blood sugar average for last 3 months) Goal HGBA1c is under 7% (HBGA1c is blood sugar average for last 3 months)  Diabetes: No known diagnosis of diabetes    Lab Results  Component Value Date   HGBA1C 5.8 (H) 04/27/2024    Your HGBA1c can be lowered with medications, healthy diet, and exercise. Check your blood sugar as directed by your physician Call your physician if you experience unexplained or low blood sugars.  PHYSICAL ACTIVITY/REHABILITATION Goal is 30 minutes at least 4 days per week  Activity: No driving, Therapies: see above Return to work: N/A Activity decreases your risk of heart attack and stroke and makes your heart stronger.  It helps control your weight and blood pressure; helps you relax and can improve your mood. Participate in a regular exercise program. Talk with your doctor about the best form of exercise for you (dancing, walking, swimming, cycling).  DIET/WEIGHT Goal is to maintain a healthy weight  Your discharge diet is:  Diet Order  Diet Heart Room service appropriate? Yes; Fluid consistency: Thin  Diet effective now                   liquids Your height is:  Height: 5' 10 (177.8 cm) Your current weight is: Weight: 68 kg Your Body Mass Index (BMI) is:  BMI (Calculated): 21.51 Following the type of diet specifically designed for you will help prevent another stroke. You are at goal weight    Your goal Body Mass Index (BMI) is 19-24. Healthy food habits can help reduce 3 risk factors for stroke:  High cholesterol, hypertension, and excess  weight.  RESOURCES Stroke/Support Group:  Call [phone]   STROKE EDUCATION PROVIDED/REVIEWED AND GIVEN TO PATIENT Stroke warning signs and symptoms How to activate emergency medical system (call 911). Medications prescribed at discharge. Need for follow-up after discharge. Personal risk factors for stroke. Pneumonia vaccine given:  Flu vaccine given:  My questions have been answered, the writing is legible, and I understand these instructions.  I will adhere to these goals & educational materials that have been provided to me after my discharge from the hospital.         My questions have been answered and I understand these instructions. I will adhere to these goals and the provided educational materials after my discharge from the hospital.  Patient/Caregiver Signature _______________________________ Date __________  Clinician Signature _______________________________________ Date __________  Please bring this form and your medication list with you to all your follow-up doctor's appointments.

## 2024-05-16 ENCOUNTER — Other Ambulatory Visit (HOSPITAL_COMMUNITY): Payer: Self-pay

## 2024-05-16 MED ORDER — MAGNESIUM OXIDE -MG SUPPLEMENT 400 (240 MG) MG PO TABS
200.0000 mg | ORAL_TABLET | Freq: Every day | ORAL | 0 refills | Status: AC
  Filled 2024-05-16: qty 30, 60d supply, fill #0

## 2024-05-16 MED ORDER — CLOPIDOGREL BISULFATE 75 MG PO TABS
75.0000 mg | ORAL_TABLET | Freq: Every day | ORAL | 0 refills | Status: AC
  Filled 2024-05-16: qty 30, 30d supply, fill #0

## 2024-05-16 MED ORDER — ATORVASTATIN CALCIUM 40 MG PO TABS
40.0000 mg | ORAL_TABLET | Freq: Every day | ORAL | 0 refills | Status: AC
  Filled 2024-05-16: qty 30, 30d supply, fill #0

## 2024-05-16 MED ORDER — ACETAMINOPHEN 325 MG PO TABS: 325.0000 mg | ORAL_TABLET | ORAL | Status: AC | PRN

## 2024-05-16 MED ORDER — TAMSULOSIN HCL 0.4 MG PO CAPS
0.4000 mg | ORAL_CAPSULE | Freq: Every day | ORAL | 0 refills | Status: AC
  Filled 2024-05-16: qty 30, 30d supply, fill #0

## 2024-05-16 MED ORDER — MUSCLE RUB 10-15 % EX CREA: 1.0000 | TOPICAL_CREAM | CUTANEOUS | Status: AC | PRN

## 2024-05-16 MED ORDER — MENTHOL 3 MG MT LOZG: 1.0000 | LOZENGE | OROMUCOSAL | Status: AC | PRN

## 2024-05-16 NOTE — Plan of Care (Signed)
Problem: RH Problem Solving ?Goal: LTG Patient will demonstrate problem solving for (SLP) ?Description: LTG:  Patient will demonstrate problem solving for basic/complex daily situations with cues  (SLP) ?Outcome: Completed/Met ?  ?Problem: RH Awareness ?Goal: LTG: Patient will demonstrate awareness during functional activites type of (SLP) ?Description: LTG: Patient will demonstrate awareness during functional activites type of (SLP) ?Outcome: Completed/Met ?  ?

## 2024-05-16 NOTE — Progress Notes (Signed)
 Occupational Therapy Session Note  Patient Details  Name: Robert Everett MRN: [id number] Date of Birth: 04-23-1933  Today's Date: 05/16/2024 OT Individual Time: 1346-1500 OT Individual Time Calculation (min): 74 min    Short Term Goals: Week 2:  OT Short Term Goal 1 (Week 2): STG=LTG due to LOS  Skilled Therapeutic Interventions/Progress Updates:     Pt received resting in bed presenting to be in good spirits receptive to skilled OT session reporting 0/10 pain- OT offering intermittent rest breaks, repositioning, and therapeutic support to optimize participation in therapy session. Focused this session on L UE NMRE and functional mobility training.   Transitioned to EOB MOD I using bed rail and donn pants/shoes with set-up A. Stand pivot EOB > wc using SBQC MOD I. Transported Pt to therapy gym in wc for energy conservation.   Pt expressing the [facility] that was delivered to his room was different than the one he had been practicing with while in rehab. Observed the two SBWC and noted his new one was slightly more X shaped vs K. Spoke with SW to see if different brand of [employer] was available through hospital vendor, however it was not. Engaged Pt in completing functional mobility training, navigating around obstacles and turning different directions to simulate ambulating in the home or community while using his new SBQC and Pt was able to complete all mobility with distant SUP provided for safety. He did kick base of cane x2 however, educated on holding cane slightly further out with noted improvement following.    Pt completed AROM of L UE working on pushing/pulling movements and hand to mouth with OT providing light resistance for increased proprioceptive feedback. Pt with improved bicep/tricep activation and increased scapular protraction/retraction with tactile cues.   Facilitated NMRE to L UE with Pt instructed to retrieve wash cloth from L side of mat table, bring wash cloth to mouth, and  then throw it at target with emphasis on maintained grasp and gravity assisted digit extension, moving through functional movement patterns. Difficulty noted grading force required to maintain grasp on wash cloth and timing when to release wash cloth.   Positioned B UE on either side of medium sized rubber ball and worked on nike activation, supination/pronation, elbow flex/extension, and shoulder flex/extension. Required increased time for rest breaks and MOD verbal cues to decrease compensatory movements.   Transported Pt back to room in wc 2/2 fatigue. Pt was left resting in bed with call bell in reach, bed alarm on, and all needs met.    Therapy Documentation Precautions:  Precautions Precautions: Fall Recall of Precautions/Restrictions: Intact Precaution/Restrictions Comments: hard of hearing Restrictions Weight Bearing Restrictions Per Provider Order: No  Therapy/Group: Individual Therapy  Katheryn SHAUNNA [place] 05/16/2024, 1:05 PM

## 2024-05-16 NOTE — Progress Notes (Signed)
 Physical Therapy Session Note  Patient Details  Name: Robert Everett MRN: [id number] Date of Birth: Jul 07, 1933  Today's Date: 05/16/2024 PT Individual Time: 9156-8984 PT Individual Time Calculation (min): 92 min   Short Term Goals: Week 1:  PT Short Term Goal 1 (Week 1): Pt will complete bed mobility with minA PT Short Term Goal 1 - Progress (Week 1): Met PT Short Term Goal 2 (Week 1): Pt will complete bed<>chair transfers with minA PT Short Term Goal 2 - Progress (Week 1): Met PT Short Term Goal 3 (Week 1): Pt will ambulate 150' with minA and LRAD PT Short Term Goal 3 - Progress (Week 1): Met PT Short Term Goal 4 (Week 1): Pt will improve TUG by at least 8 seconds PT Short Term Goal 4 - Progress (Week 1): Met Week 2:  PT Short Term Goal 1 (Week 2): STG = LTG due to ELOS    Skilled Therapeutic Interventions/Progress Updates:  Handoff from NT in bathroom. Pt completed sit > stands mod I with grab bar, required assistance with pants. S for gait out of bathroom to sink for hygiene. Pt declined gait down to therapy gym due to stating he hasn't been moving as much. Encouraged that this was a good reason to try it, but still declined. Focused on simulated car transfer and completed at mod I level for sedan/low SUV. Pt thinks his son might now pick him up and has a truck, so may require CGA for that transfer depending on the height. S for gait up and down ramp for uneven surfaces and home access practice with NBQC. Strength and sensation assessment completed - see d/c summary for details.    Practiced bed mobility and transfers in ADL apartment to bed similar to his height at home completing bed mobility independently for supine <> sit. Pt performed functional mobility in room with NBQC at supervision level. Pt still continues to be inconsistent with sit > stands or gait at times mod I but also does require S at times due to posterior lean with sit > stands initially and cues for overall safety and  to use device.  Completed stair negotiation for NMR for strength, balance and coordination x 12 steps with R handrail at S level and step to pattern.   TUG administered for fall risk assessment - see results below.  Issued and performed the following HEP for strength, balance, and muscular endurance. Handout given as well.     Access Code: [email or web address] URL: [email or web address] Date: 05/16/2024 Prepared by: Donald  Exercises - Standing Hip Abduction with Counter Support  - 1 x daily - 7 x weekly - 3 sets - 10 reps - Standing Marching  - 1 x daily - 7 x weekly - 3 sets - 10 reps - Standing Heel Raise with Support  - 1 x daily - 7 x weekly - 3 sets - 10 reps - Standing Toe Raises at Chair  - 1 x daily - 7 x weekly - 3 sets - 10 reps - Mini Squat with Counter Support  - 1 x daily - 7 x weekly - 3 sets - 10 reps - Sit to Stand  - 1 x daily - 7 x weekly - 3 sets - 10 reps  Functional gait on unit x 150' with NBQC at S level for safety to focus on overall strengthening, balance and endurance. Discussed DME with CSW - after some back and forth with family they will bring the cane that they at  home tomorrow at d/c to determine if it is a NBQC or not. CSW will follow up and order as needed.  Pt denies concerns with d/c home. Does not appear that family has come in for formal family education - recommending S for mobility due to inconsistent performance between mod I/S throughout. Initial movements in AM appear to require more S due to balance deficits - as pt up and moving, demo's improved overall safety.  Completed 5.5 min on level 3 of Nustep with LUE (using adaptive piece) for recripocal movement pattern retraining and focused grip and strength on L hemi body.  Returned to room at end of session with mod I transfer back to bed and repositioned himself in bed independently.  Therapy Documentation Precautions:  Precautions Precautions: Fall Recall of Precautions/Restrictions:  Intact Precaution/Restrictions Comments: hard of hearing Restrictions Weight Bearing Restrictions Per Provider Order: No    Pain: Pain Assessment Pain Scale: 0-10 Pain Score: 0-No pain Mobility: Bed Mobility Bed Mobility: Rolling Right;Supine to Sit;Sit to Supine Rolling Right: Independent Right Sidelying to Sit: Independent Supine to Sit: Independent Sit to Supine: Independent Transfers Transfers: Sit to Stand;Stand to [employer] Transfers Sit to Stand: Independent with assistive device;Supervision/Verbal cueing Stand to Sit: Independent with assistive device;Supervision/Verbal cueing Stand Pivot Transfers: Independent with assistive device;Supervision/Verbal cueing Stand Pivot Transfer Details: Verbal cues for precautions/safety;Verbal cues for technique Transfer (Assistive device): Small based quad cane Locomotion : Gait Ambulation: Yes Gait Assistance: Supervision/Verbal cueing Gait Gait: Yes Gait velocity: 1.57 ft/sec Stairs / Additional Locomotion Stairs: Yes Stairs Assistance: Supervision/Verbal cueing Stair Management Technique: One rail Right;Step to pattern Ramp: Supervision/Verbal cueing     Balance: Timed Up and Go Test TUG: Normal TUG Normal TUG (seconds): 27 (no AD avg 3 trials (30 sec, 27 sec, 24 sec)) Static Sitting Balance Static Sitting - Level of Assistance: 7: Independent Dynamic Sitting Balance Dynamic Sitting - Level of Assistance: 7: Independent Static Standing Balance Static Standing - Level of Assistance: 6: Modified independent (Device/Increase time) Dynamic Standing Balance Dynamic Standing - Level of Assistance: 5: Stand by assistance     Therapy/Group: Individual Therapy  [name] Pizza [name] [name] [name], PT, DPT, CBIS  05/16/2024, 10:23 AM

## 2024-05-16 NOTE — Progress Notes (Signed)
 Inpatient Rehabilitation Discharge Medication Review by a Pharmacist  A complete drug regimen review was completed for this patient to identify any potential clinically significant medication issues.  High Risk Drug Classes Is patient taking? Indication by Medication  Antipsychotic No   Anticoagulant No   Antibiotic No   Opioid No   Antiplatelet Yes Plavix - cva ppx  Hypoglycemics/insulin No   Vasoactive Medication Yes Flomax - BPH  Chemotherapy No   Other Yes Lipitor- HLD      Type of Medication Issue Identified Description of Issue Recommendation(s)  Drug Interaction(s) (clinically significant)     Duplicate Therapy     Allergy     No Medication Administration End Date     Incorrect Dose     Additional Drug Therapy Needed     Significant med changes from prior encounter (inform family/care partners about these prior to discharge).    Other       Clinically significant medication issues were identified that warrant physician communication and completion of prescribed/recommended actions by midnight of the next day:  No  Time spent performing this drug regimen review (minutes):  30    Mihail Prettyman BS, PharmD, BCPS Clinical Pharmacist 05/16/2024 1:01 PM  Contact: [phone] after 3 PM

## 2024-05-16 NOTE — Progress Notes (Addendum)
 Patient ID: Robert Everett, male   DOB: June 27, 1933, 89 y.o.   MRN: [id number]  Submitted order for narrow-based quad cane via [employer]   UPDATE: Order cancelled due to patient having this item at home.

## 2024-05-16 NOTE — Progress Notes (Signed)
 Speech Language Pathology Discharge Summary  Patient Details  Name: Robert Everett MRN: [id number] Date of Birth: 05-18-1933  Date of Discharge from SLP service:May 16, 2024  Today's Date: 05/16/2024 SLP Individual Time: 8884-8843  SLP Total Minutes: 41 min  Skilled Therapeutic Interventions:  SLP conducted skilled therapy session targeting cognitive goals. Patient completed mildly complex sequencing and organization task with supervision. SLP and patient then discussed plans for medication management given upcoming discharge. Discussed current medications, purposes, names, and times taken and provided patient with information re: typical discharge procedures. Patient verbalized understanding to all information provided. In final minutes of session, conducted medication organization task where patient identified errors in BID pillbox with min fading to supervision assist once task became familiar. Encouraged patient to allow family to assist with medications at home to promote medication safety and accuracy. Patient was left in room with call bell in reach and alarm set. SLP will continue to target goals per plan of care.      Patient has met 2 of 2 long term goals.  Patient to discharge at overall Supervision level.  Reasons goals not met: n/Robert   Clinical Impression/Discharge Summary:   Patient has made steady progress towards therapy goals, meeting 2/2 long term goals set for duration of admission. Patient currently benefits from supervision assist to solve familiar mildly complex functional problems and to identify changes to cognitive performance. Patient would benefit from ongoing SLP services at next venue of care to target lingering deficits. Patient education complete. SLP will sign off.   Care Partner:  Caregiver Able to Provide Assistance: Yes  Type of Caregiver Assistance: Cognitive  Recommendation:  Home Health SLP;Outpatient SLP  Rationale for SLP Follow Up: Maximize cognitive  function and independence   Equipment: n/Robert   Reasons for discharge: Treatment goals met;Discharged from hospital   Patient/Family Agrees with Progress Made and Goals Achieved: Yes   Robert Everett, M.Robert., CCC-SLP  Robert Everett Robert Everett 05/16/2024, 8:30 AM

## 2024-05-16 NOTE — Progress Notes (Signed)
 "                                                        PROGRESS NOTE   Subjective/Complaints: No new complaints this morning Appreciative of PT and OT performing myofascial release for him and improving his neck pain  ROS: Per HPI above.  Denies fever, CP, SOB,abdominal pain, nausea Denies any motor or sensory changes  +left sided weakness, +low back and neck pain- improved, +nocturia- improved a little with flomax , +earwax in ears- improved   Objective:   No results found.  Recent Labs    05/15/24 0435  WBC 7.9  HGB 13.0  HCT 37.8*  PLT 198    Recent Labs    05/15/24 0435  NA 138  K 4.7  CL 106  CO2 26  GLUCOSE 102*  BUN 26*  CREATININE 1.10  CALCIUM  9.1      Intake/Output Summary (Last 24 hours) at 05/16/2024 0949 Last data filed at 05/16/2024 0900 Gross per 24 hour  Intake 720 ml  Output 1100 ml  Net -380 ml        Physical Exam: Vital Signs Blood pressure (!) 127/44, pulse (!) 59, temperature 97.9 F (36.6 C), temperature source Oral, resp. rate 18, height 5' 10 (1.778 m), weight 68 kg, SpO2 98%. Gen: no distress, normal appearing.  Sitting in WC, appears comfortable HEENT: oral mucosa pink and moist, NCAT Cardio: Bradycardic Chest: CTAB, normal effort, normal rate of breathing Abd: soft, non-distended, positive bowel sounds Ext: no edema Psych: pleasant, normal affect Skin: intact Neuro: Alert and oriented x3,  Able to lift left upper extremity to gravity LLE is 3/5 throughout sensation is intact.  Hard of hearing , stable 1/27 MSK: Mild tenderness to palpation throughout bilateral cervical paraspinals, levator scapula, splenius capitis, and trapezius muscles.  Also, lumbar paraspinals unchanged.    Assessment/Plan: 1. Functional deficits which require 3+ hours per day of interdisciplinary therapy in a comprehensive inpatient rehab setting. Physiatrist is providing close team supervision and 24 hour management of active medical problems  listed below. Physiatrist and rehab team continue to assess barriers to discharge/monitor patient progress toward functional and medical goals  Care Tool:  Bathing    Body parts bathed by patient: Left arm, Chest, Abdomen, Front perineal area, Buttocks, Right upper leg, Left upper leg, Face, Right lower leg   Body parts bathed by helper: Right arm, Left lower leg     Bathing assist Assist Level: Minimal Assistance - Patient > 75%     Upper Body Dressing/Undressing Upper body dressing   What is the patient wearing?: Pull over shirt    Upper body assist Assist Level: Moderate Assistance - Patient 50 - 74%    Lower Body Dressing/Undressing Lower body dressing      What is the patient wearing?: Pants     Lower body assist Assist for lower body dressing: Minimal Assistance - Patient > 75%     Toileting Toileting Toileting Activity did not occur (Clothing management and hygiene only): N/A (no void or bm)  Toileting assist Assist for toileting: Moderate Assistance - Patient 50 - 74%     Transfers Chair/bed transfer  Transfers assist     Chair/bed transfer assist level: Supervision/Verbal cueing     Locomotion Ambulation   Ambulation assist  Assist level: Contact Guard/Touching assist Assistive device: Cane-quad Max distance: 150   Walk 10 feet activity   Assist     Assist level: Contact Guard/Touching assist Assistive device: Cane-quad   Walk 50 feet activity   Assist    Assist level: Contact Guard/Touching assist Assistive device: Cane-quad    Walk 150 feet activity   Assist    Assist level: Contact Guard/Touching assist Assistive device: Cane-quad    Walk 10 feet on uneven surface  activity   Assist Walk 10 feet on uneven surfaces activity did not occur: Safety/medical concerns   Assist level: Contact Guard/Touching assist     Wheelchair     Assist Is the patient using a wheelchair?: Yes Type of Wheelchair: Manual     Wheelchair assist level: Minimal Assistance - Patient > 75% Max wheelchair distance: 74'    Wheelchair 50 feet with 2 turns activity    Assist        Assist Level: Moderate Assistance - Patient 50 - 74%   Wheelchair 150 feet activity     Assist      Assist Level: Total Assistance - Patient < 25%   Blood pressure (!) 127/44, pulse (!) 59, temperature 97.9 F (36.6 C), temperature source Oral, resp. rate 18, height 5' 10 (1.778 m), weight 68 kg, SpO2 98%.  Medical Problem List and Plan: 1. 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  Chronic LLE DVT/ Antithrombotics: -DVT/anticoagulation:  Pharmaceutical: continue Lovenox , hgb reviewed and has improved             -antiplatelet therapy: DAPT X 3 weeks followed by Plavix  alone.   3. Chronic LBP/ Pain Management: Tylenol  prn. Kpad ordered- asked nursing to please get him new one as current is not working. Continue Voltaren  gel 4 times daily to shoulders and back.  Has as needed muscle rub.    4. Mood/Behavior/Sleep: LCSW to follow for evaluation and support             --Melatonin prn for insomnia.              -antipsychotic agents: N/A  5. Neuropsych/cognition: This patient is capable of making decisions on his own behalf.  6. Constipation: LBM 1/25- messaged nursing to confirm accuracy, asked nursing to please offer him prune juice, magnesium  oxide added HS  7. Diastolic hypotension: continue to monitor BP TID  8.  Thrombocytopenia: reviewed and has resolved  9. Moderate to severe aortic regurgitation: BC X 2 negative so far. S/p TEE. Reviewed with patient and family management of PFO, outpatient follow-up  10. Dyslipidemia: continue Liptor @ 40 mg.   11. BPH: continue flomax  to 0.4mg   12. Systolic HTN: continue flomax  0.4mg   LOS: 14 days A FACE TO FACE  EVALUATION WAS PERFORMED  Robert Everett Robert Everett 05/16/2024, 9:49 AM     "

## 2024-05-17 ENCOUNTER — Other Ambulatory Visit (HOSPITAL_COMMUNITY): Payer: Self-pay

## 2024-05-17 NOTE — Progress Notes (Signed)
 Inpatient Rehabilitation Care Coordinator Discharge Note   Patient Details  Name: Robert Everett MRN: [id number] Date of Birth: 10/25/1933   Discharge location: Home with spouse; daughter and court providing support as needed  Length of Stay: 14 days  Discharge activity level: Independent with assistive device  Home/community participation: Limited activity in the community  Patient response un:Yzjouy Literacy - How often do you need to have someone help you when you read instructions, pamphlets, or other written material from your doctor or pharmacy?: Never  Patient response un:Dnrpjo Isolation - How often do you feel lonely or isolated from those around you?: Never  Services provided included: MD, PT, RD, OT, SLP, RN, CM, TR, Pharmacy, SW  Financial Services:  Field Seismologist Utilized: Private Insurance [employer] MEDICARE / [facility] MEDICARE  Choices offered to/list presented to: Patient  Follow-up services arranged:  DME Home Health Agency: [facility]    DME : 4-prong narrow based cane    Patient response to transportation need: Is the patient able to respond to transportation needs?: Yes In the past 12 months, has lack of transportation kept you from medical appointments or from getting medications?: No In the past 12 months, has lack of transportation kept you from meetings, work, or from getting things needed for daily living?: No   Patient/Family verbalized understanding of follow-up arrangements:  Yes  Individual responsible for coordination of the follow-up plan: Patient  Confirmed correct DME delivered: Robert  Everett 05/17/2024    Comments (or additional information): Patient will discharge home with his wife, daughter and grandson who will provide care and support. 4-prong narrow based cane delivered yesterday for patient use.   Summary of Stay    Date/Time Discharge Planning CSW  05/10/24 1427 Plans to discharge home with spouse and support  from daughter and grandson. Has some DME..will await verification on cane. Awaiting therapy follow-up recommendations. DS  05/03/24 1534 Home with spouse and support from daughter and grandson. Has some DME..will await verification on cane. Awaiting therapy follow-up recommendations. DS       Robert  Everett

## 2024-05-17 NOTE — Progress Notes (Signed)
 Physical Therapy Discharge Summary  Patient Details  Name: Robert Everett MRN: [id number] Date of Birth: 10-20-1933  Date of Discharge from PT service:May 17, 2024   Patient has met 5 of 9 long term goals due to improved activity tolerance, improved balance, improved postural control, increased strength, decreased pain, ability to compensate for deficits, functional use of  left upper extremity and left lower extremity, improved attention, improved awareness, and improved coordination.  Patient to discharge at an ambulatory level Supervision.   Patient's care partner is independent to provide the necessary physical assistance at discharge.  Functional Outcome Measures: BERG  33/56 TUG 28 seconds  Reasons goals not met: Goals were upgraded to mod I overall during his stay. Unfortunately, he still required supervision for dynamic standing balance, gait, and stair navigation.   Recommendation:  Patient will benefit from ongoing skilled PT services in home health setting to continue to advance safe functional mobility, address ongoing impairments in L sided weakness, home safety, caregiver training, and minimize fall risk.  Equipment: QC  Reasons for discharge: treatment goals met and discharge from hospital  Patient/family agrees with progress made and goals achieved: Yes  PT Discharge Precautions/Restrictions Precautions Precautions: Fall Precaution/Restrictions Comments: hard of hearing, L hemi Restrictions Weight Bearing Restrictions Per Provider Order: No Pain Interference Pain Interference Pain Effect on Sleep: 0. Does not apply - I have not had any pain or hurting in the past 5 days Pain Interference with Therapy Activities: 1. Rarely or not at all Pain Interference with Day-to-Day Activities: 1. Rarely or not at all Vision/Perception  Vision - History Ability to See in Adequate Light: 0 Adequate Perception Perception: Within Functional Limits Praxis Praxis: WFL   Cognition Overall Cognitive Status: Impaired/Different from baseline Arousal/Alertness: Awake/alert Orientation Level: Oriented X4 Year: 2026 Month: January Day of Week: Correct Attention: Selective Sustained Attention: Appears intact Selective Attention: Impaired Selective Attention Impairment: Functional basic;Verbal basic Memory: Impaired Memory Impairment: Retrieval deficit;Storage deficit Awareness: Impaired Awareness Impairment: Emergent impairment;Anticipatory impairment;Intellectual impairment Problem Solving: Impaired Problem Solving Impairment: Verbal basic;Functional basic Executive Function: Sequencing;Organizing Sequencing: Impaired Sequencing Impairment: Verbal basic;Functional basic Organizing: Impaired Organizing Impairment: Verbal basic;Functional basic Safety/Judgment: Appears intact Sensation Sensation Light Touch: Appears Intact Hot/Cold: Appears Intact Proprioception: Appears Intact Stereognosis: Not tested Coordination Gross Motor Movements are Fluid and Coordinated: No Fine Motor Movements are Fluid and Coordinated: No Coordination and Movement Description: left hemiparesis Motor  Motor Motor: Hemiplegia;Motor impersistence;Abnormal postural alignment and control Motor - Skilled Clinical Observations: left hemiparesis UE>LE Motor - Discharge Observations: left hemi UE>LE  Mobility Bed Mobility Bed Mobility: Rolling Right;Supine to Sit;Sit to Supine Rolling Right: Independent Right Sidelying to Sit: Independent Supine to Sit: Independent Sit to Supine: Independent Transfers Transfers: Sit to Stand;Stand to [employer] Transfers Sit to Stand: Independent with assistive device;Supervision/Verbal cueing Stand to Sit: Independent with assistive device;Supervision/Verbal cueing Stand Pivot Transfers: Independent with assistive device;Supervision/Verbal cueing Stand Pivot Transfer Details: Verbal cues for precautions/safety;Verbal cues for  technique Transfer (Assistive device): Small based quad cane Locomotion  Gait Ambulation: Yes Gait Assistance: Supervision/Verbal cueing Gait Distance (Feet): 165 Feet Assistive device: Small based quad cane Gait Assistance Details: Verbal cues for gait pattern;Verbal cues for precautions/safety;Verbal cues for safe use of DME/AE Gait Gait: Yes Gait Pattern: Impaired Gait Pattern: Step-through pattern;Lateral trunk lean to left;Trunk flexed;Narrow base of support Gait velocity: 1.57 ft/sec Stairs / Additional Locomotion Stairs: Yes Stairs Assistance: Supervision/Verbal cueing Stair Management Technique: One rail Right;Step to pattern Number of Stairs: 12 Height of Stairs: 6  Ramp: Supervision/Verbal cueing Wheelchair Mobility Wheelchair Mobility: No  Trunk/Postural Assessment  Cervical Assessment Cervical Assessment: Within Functional Limits Thoracic Assessment Thoracic Assessment: Exceptions to [facility] (rounding of shoulders) Lumbar Assessment Lumbar Assessment: Within Functional Limits Postural Control Postural Control: Deficits on evaluation Righting Reactions: delayed toward left  Balance Balance Balance Assessed: Yes Static Sitting Balance Static Sitting - Balance Support: Feet supported;No upper extremity supported Static Sitting - Level of Assistance: 7: Independent Dynamic Sitting Balance Dynamic Sitting - Balance Support: Feet supported;No upper extremity supported Dynamic Sitting - Level of Assistance: 7: Independent Static Standing Balance Static Standing - Balance Support: Right upper extremity supported Static Standing - Level of Assistance: 6: Modified independent (Device/Increase time) Dynamic Standing Balance Dynamic Standing - Balance Support: Right upper extremity supported;During functional activity;Left upper extremity supported Dynamic Standing - Level of Assistance: 5: Stand by assistance Extremity Assessment      RLE Assessment RLE Assessment:  Within Functional Limits LLE Assessment LLE Assessment: Exceptions to [facility] Strength Comments: grossly 4-/5   Robert Everett Robert Everett 05/17/2024, 7:32 AM

## 2024-05-17 NOTE — Progress Notes (Signed)
 Occupational Therapy Note  Patient Details  Name: Robert Everett MRN: [id number] Date of Birth: [birth year]   Occupational Therapist participated in the interdisciplinary team conference, providing clinical information regarding the patient's current status, treatment goals, and weekly focus, including any barriers that need to be addressed. Please see the Inpatient Rehabilitation Team Conference and Plan of Care Update for further details.    Baillie Mohammad M 05/17/2024, 12:20 PM

## 2024-05-17 NOTE — Patient Care Conference (Incomplete Revision)
 Inpatient RehabilitationTeam Conference and Plan of Care Update Date: 05/17/2024   Time: 11:22 AM    Patient Name: Robert Everett      Medical Record Number: [id number]  Date of Birth: May 31, 1933 Sex: Male         Room/Bed: 4W02C/4W02C-01 Payor Info: Payor: [employer] MEDICARE / Plan: [facility] MEDICARE / Product Type: *No Product type* /    Admit Date/Time:  05/02/2024  5:11 PM  Primary Diagnosis:  Small vessel stroke [facility])  Hospital Problems: Principal Problem:   Small vessel stroke [facility]) Active Problems:   Constipation    Expected Discharge Date: Expected Discharge Date: 05/17/24  Team Members Present: Physician leading conference: Dr. Sven Elks Social Worker Present: Waverly Gentry, LCSW-A Nurse Present: Barnie Ronde, RN PT Present: Sherlean Perks, PT OT Present: Monica Peacock, OT SLP Present: Rosina Downy, SLP PPS Coordinator present : Eleanor Colon, SLP     Current Status/Progress Goal Weekly Team Focus  Bowel/Bladder   patient is continent. Last BM 1/26.   Meet toileting needs of patient.   Toilet patient q4-6 hours and prn.    Swallow/Nutrition/ Hydration               ADL's   min assist BADL   Intermittent min assist   discharge    Mobility               Communication                Safety/Cognition/ Behavioral Observations  mild deficits in memory, problem solving, awareness, ready for discharge   supervisionA   at goal level, ready for discharge    Pain   Denies pain.   Pain of 0.   Assess pain q shift and prn.    Skin   Pain is clean, dry and intact.   No skin breakdown.  Assess skin q shift and prn.      Discharge Planning:      Team Discussion: Patient admitted post multi cryptogenic CVAs with SVD, left upper extremity hemiplegia and left lower extremity weakness and dysarthria. Progress limited by mild- moderate cognitive issues, back pain, poor initiation and delayed reaction time.   Patient on target to meet  rehab goals: yes, patient has good return in the left upper extremity; using a small based quad cane for mobility.  Has met goals for discharge  *See Care Plan and progress notes for long and short-term goals.   Revisions to Treatment Plan:  Referral to land based/aquatic therapy at [facility]   Teaching Needs: Safety, medications, transfers, toileting, etc.   Current Barriers to Discharge: Decreased caregiver support  Possible Resolutions to Barriers: Family education Ramp for entry to home     Medical Summary Current Status: small vessel stroke, BPH, constipation  Barriers to Discharge: Medical stability  Barriers to Discharge Comments: small vessel stroke, BPH, constipation Possible Resolutions to [employer] Focus: continue aspirin , continue statin, continue plavix  and lovenox , continue flomax , continue magneisum oxide   Continued Need for Acute Rehabilitation Level of Care: The patient requires daily medical management by a physician with specialized training in physical medicine and rehabilitation for the following reasons: Direction of a multidisciplinary physical rehabilitation program to maximize functional independence : Yes Medical management of patient stability for increased activity during participation in an intensive rehabilitation regime.: Yes Analysis of laboratory values and/or radiology reports with any subsequent need for medication adjustment and/or medical intervention. : Yes   I attest that I was present, lead the team  conference, and concur with the assessment and plan of the team.   Robert Everett 05/17/2024, 3:19 PM

## 2024-05-17 NOTE — Patient Care Conference (Cosign Needed)
 Inpatient RehabilitationTeam Conference and Plan of Care Update Date: 05/17/2024   Time: 11:22 AM    Patient Name: Robert Everett      Medical Record Number: [id number]  Date of Birth: 12/16/1933 Sex: Male         Room/Bed: 4W02C/4W02C-01 Payor Info: Payor: [employer] MEDICARE / Plan: [facility] MEDICARE / Product Type: *No Product type* /    Admit Date/Time:  05/02/2024  5:11 PM  Primary Diagnosis:  Small vessel stroke [facility])  Hospital Problems: Principal Problem:   Small vessel stroke [facility]) Active Problems:   Constipation    Expected Discharge Date: Expected Discharge Date: 05/17/24  Team Members Present: Physician leading conference: Dr. Sven Elks Social Worker Present: Waverly Gentry, LCSW-A Nurse Present: Barnie Ronde, RN PT Present: Sherlean Perks, PT OT Present: Monica Peacock, OT SLP Present: Rosina Downy, SLP PPS Coordinator present : Eleanor Colon, SLP     Current Status/Progress Goal Weekly Team Focus  Bowel/Bladder   patient is continent. Last BM 1/26.   Meet toileting needs of patient.   Toilet patient q4-6 hours and prn.    Swallow/Nutrition/ Hydration               ADL's   min assist BADL   Intermittent min assist   discharge    Mobility               Communication                Safety/Cognition/ Behavioral Observations  mild deficits in memory, problem solving, awareness, ready for discharge   supervisionA   at goal level, ready for discharge    Pain   Denies pain.   Pain of 0.   Assess pain q shift and prn.    Skin   Pain is clean, dry and intact.   No skin breakdown.  Assess skin q shift and prn.      Discharge Planning:  Plans to discharge home with spouse and support from daughter and grandson. Can delivered. HH established with [facility].   Team Discussion: Patient admitted post multi cryptogenic CVAs with SVD, left upper extremity hemiplegia and left lower extremity weakness and dysarthria. Progress limited by  mild- moderate cognitive issues, back pain, poor initiation and delayed reaction time.   Patient on target to meet rehab goals: yes, patient has good return in the left upper extremity; using a small based quad cane for mobility.  Has met goals for discharge  *See Care Plan and progress notes for long and short-term goals.   Revisions to Treatment Plan:  Referral to land based/aquatic therapy at [facility]   Teaching Needs: Safety, medications, transfers, toileting, etc.   Current Barriers to Discharge: Decreased caregiver support  Possible Resolutions to Barriers: Family education Ramp for entry to home     Medical Summary Current Status: small vessel stroke, BPH, constipation  Barriers to Discharge: Medical stability  Barriers to Discharge Comments: small vessel stroke, BPH, constipation Possible Resolutions to [employer] Focus: continue aspirin , continue statin, continue plavix  and lovenox , continue flomax , continue magneisum oxide   Continued Need for Acute Rehabilitation Level of Care: The patient requires daily medical management by a physician with specialized training in physical medicine and rehabilitation for the following reasons: Direction of a multidisciplinary physical rehabilitation program to maximize functional independence : Yes Medical management of patient stability for increased activity during participation in an intensive rehabilitation regime.: Yes Analysis of laboratory values and/or radiology reports with any subsequent need for medication adjustment  and/or medical intervention. : Yes   I attest that I was present, lead the team conference, and concur with the assessment and plan of the team.   Fredericka Sober B 05/18/2024, 9:25 AM

## 2024-05-18 DIAGNOSIS — N401 Enlarged prostate with lower urinary tract symptoms: Secondary | ICD-10-CM | POA: Insufficient documentation

## 2024-05-18 DIAGNOSIS — R7989 Other specified abnormal findings of blood chemistry: Secondary | ICD-10-CM | POA: Insufficient documentation

## 2024-05-31 ENCOUNTER — Encounter: Admitting: Registered Nurse
